# Patient Record
Sex: Female | Born: 1960 | Race: White | Hispanic: No | Marital: Married | Smoking: Former smoker
Health system: Southern US, Community
[De-identification: ages and names within clinical notes are randomized; demographics above are authoritative.]

## PROBLEM LIST (undated history)

## (undated) DIAGNOSIS — M810 Age-related osteoporosis without current pathological fracture: Secondary | ICD-10-CM

## (undated) DIAGNOSIS — M1990 Unspecified osteoarthritis, unspecified site: Secondary | ICD-10-CM

## (undated) DIAGNOSIS — R700 Elevated erythrocyte sedimentation rate: Secondary | ICD-10-CM

## (undated) DIAGNOSIS — K3532 Acute appendicitis with perforation and localized peritonitis, without abscess: Secondary | ICD-10-CM

## (undated) DIAGNOSIS — M179 Osteoarthritis of knee, unspecified: Secondary | ICD-10-CM

## (undated) DIAGNOSIS — F329 Major depressive disorder, single episode, unspecified: Secondary | ICD-10-CM

## (undated) DIAGNOSIS — K219 Gastro-esophageal reflux disease without esophagitis: Secondary | ICD-10-CM

## (undated) DIAGNOSIS — F419 Anxiety disorder, unspecified: Secondary | ICD-10-CM

## (undated) DIAGNOSIS — K439 Ventral hernia without obstruction or gangrene: Secondary | ICD-10-CM

## (undated) DIAGNOSIS — R7989 Other specified abnormal findings of blood chemistry: Secondary | ICD-10-CM

## (undated) DIAGNOSIS — G4700 Insomnia, unspecified: Secondary | ICD-10-CM

## (undated) DIAGNOSIS — M47816 Spondylosis without myelopathy or radiculopathy, lumbar region: Secondary | ICD-10-CM

## (undated) DIAGNOSIS — J309 Allergic rhinitis, unspecified: Secondary | ICD-10-CM

## (undated) DIAGNOSIS — K631 Perforation of intestine (nontraumatic): Secondary | ICD-10-CM

## (undated) DIAGNOSIS — M1710 Unilateral primary osteoarthritis, unspecified knee: Secondary | ICD-10-CM

## (undated) DIAGNOSIS — F32A Depression, unspecified: Secondary | ICD-10-CM

## (undated) DIAGNOSIS — M791 Myalgia, unspecified site: Secondary | ICD-10-CM

## (undated) DIAGNOSIS — J449 Chronic obstructive pulmonary disease, unspecified: Secondary | ICD-10-CM

## (undated) DIAGNOSIS — M2550 Pain in unspecified joint: Secondary | ICD-10-CM

## (undated) DIAGNOSIS — F1020 Alcohol dependence, uncomplicated: Secondary | ICD-10-CM

## (undated) DIAGNOSIS — C539 Malignant neoplasm of cervix uteri, unspecified: Secondary | ICD-10-CM

## (undated) DIAGNOSIS — K8050 Calculus of bile duct without cholangitis or cholecystitis without obstruction: Secondary | ICD-10-CM

## (undated) DIAGNOSIS — E7800 Pure hypercholesterolemia, unspecified: Secondary | ICD-10-CM

## (undated) DIAGNOSIS — Z1211 Encounter for screening for malignant neoplasm of colon: Secondary | ICD-10-CM

## (undated) DIAGNOSIS — J45909 Unspecified asthma, uncomplicated: Secondary | ICD-10-CM

## (undated) HISTORY — DX: Chronic obstructive pulmonary disease, unspecified: J44.9

## (undated) HISTORY — PX: PARTIAL HYSTERECTOMY: SHX80

## (undated) HISTORY — DX: Other specified abnormal findings of blood chemistry: R79.89

## (undated) HISTORY — DX: Calculus of bile duct without cholangitis or cholecystitis without obstruction: K80.50

## (undated) HISTORY — PX: ABDOMINAL HYSTERECTOMY: SHX81

## (undated) HISTORY — DX: Malignant neoplasm of cervix uteri, unspecified: C53.9

## (undated) HISTORY — DX: Acute appendicitis with perforation, localized peritonitis, and gangrene, without abscess: K35.32

## (undated) HISTORY — DX: Perforation of intestine (nontraumatic): K63.1

## (undated) HISTORY — PX: OTHER SURGICAL HISTORY: SHX169

## (undated) HISTORY — DX: Major depressive disorder, single episode, unspecified: F32.9

## (undated) HISTORY — DX: Allergic rhinitis, unspecified: J30.9

## (undated) HISTORY — DX: Anxiety disorder, unspecified: F41.9

## (undated) HISTORY — DX: Age-related osteoporosis without current pathological fracture: M81.0

## (undated) HISTORY — DX: Insomnia, unspecified: G47.00

## (undated) HISTORY — PX: HERNIA REPAIR: SHX51

## (undated) HISTORY — PX: TONSILLECTOMY: SUR1361

## (undated) HISTORY — DX: Unspecified osteoarthritis, unspecified site: M19.90

## (undated) HISTORY — PX: CYSTOSCOPY: SUR368

## (undated) HISTORY — PX: CHOLECYSTECTOMY: SHX55

## (undated) HISTORY — DX: Myalgia, unspecified site: M79.10

## (undated) HISTORY — DX: Gastro-esophageal reflux disease without esophagitis: K21.9

## (undated) HISTORY — DX: Osteoarthritis of knee, unspecified: M17.9

## (undated) HISTORY — DX: Ventral hernia without obstruction or gangrene: K43.9

## (undated) HISTORY — PX: BILATERAL SALPINGOOPHORECTOMY: SHX1223

## (undated) HISTORY — DX: Alcohol dependence, uncomplicated: F10.20

## (undated) HISTORY — PX: ESOPHAGOGASTRODUODENOSCOPY: SHX1529

## (undated) HISTORY — DX: Unspecified asthma, uncomplicated: J45.909

## (undated) HISTORY — PX: COSMETIC SURGERY: SHX468

## (undated) HISTORY — PX: COLOSTOMY: SHX63

## (undated) HISTORY — DX: Depression, unspecified: F32.A

## (undated) HISTORY — PX: NOSE SURGERY: SHX723

## (undated) HISTORY — DX: Pure hypercholesterolemia, unspecified: E78.00

## (undated) HISTORY — PX: SMALL INTESTINE SURGERY: SHX150

## (undated) HISTORY — DX: Encounter for screening for malignant neoplasm of colon: Z12.11

## (undated) HISTORY — DX: Unilateral primary osteoarthritis, unspecified knee: M17.10

## (undated) HISTORY — PX: MOUTH SURGERY: SHX715

## (undated) HISTORY — DX: Spondylosis without myelopathy or radiculopathy, lumbar region: M47.816

## (undated) HISTORY — PX: COLON SURGERY: SHX602

## (undated) HISTORY — PX: COLONOSCOPY: SHX174

---
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
## 1981-11-23 HISTORY — PX: PARTIAL HYSTERECTOMY: SHX80

## 1982-11-23 HISTORY — PX: TOTAL ABDOMINAL HYSTERECTOMY W/ BILATERAL SALPINGOOPHORECTOMY: SHX83

## 2008-11-23 HISTORY — PX: BREAST BIOPSY: SHX20

## 2013-11-23 DIAGNOSIS — K5790 Diverticulosis of intestine, part unspecified, without perforation or abscess without bleeding: Secondary | ICD-10-CM

## 2013-11-23 HISTORY — DX: Diverticulosis of intestine, part unspecified, without perforation or abscess without bleeding: K57.90

## 2014-02-21 DIAGNOSIS — K429 Umbilical hernia without obstruction or gangrene: Secondary | ICD-10-CM | POA: Insufficient documentation

## 2014-02-21 DIAGNOSIS — N83201 Unspecified ovarian cyst, right side: Secondary | ICD-10-CM | POA: Insufficient documentation

## 2014-04-02 ENCOUNTER — Encounter: Payer: Self-pay | Admitting: Family Medicine

## 2014-04-02 DIAGNOSIS — Z860101 Personal history of adenomatous and serrated colon polyps: Secondary | ICD-10-CM

## 2014-04-02 DIAGNOSIS — Z86010 Personal history of colonic polyps: Secondary | ICD-10-CM

## 2014-04-02 HISTORY — PX: COLONOSCOPY: SHX174

## 2014-04-02 HISTORY — DX: Personal history of adenomatous and serrated colon polyps: Z86.0101

## 2014-04-02 HISTORY — DX: Personal history of colonic polyps: Z86.010

## 2014-05-10 HISTORY — PX: SALPINGOOPHORECTOMY: SHX82

## 2014-10-09 DIAGNOSIS — F331 Major depressive disorder, recurrent, moderate: Secondary | ICD-10-CM | POA: Insufficient documentation

## 2014-11-23 HISTORY — PX: VENTRAL HERNIA REPAIR: SHX424

## 2016-09-04 DIAGNOSIS — F411 Generalized anxiety disorder: Secondary | ICD-10-CM | POA: Insufficient documentation

## 2017-07-27 DIAGNOSIS — G44211 Episodic tension-type headache, intractable: Secondary | ICD-10-CM | POA: Insufficient documentation

## 2017-09-08 ENCOUNTER — Ambulatory Visit (INDEPENDENT_AMBULATORY_CARE_PROVIDER_SITE_OTHER)
Admission: RE | Admit: 2017-09-08 | Discharge: 2017-09-08 | Disposition: A | Discharge status: Home / Self Care | Payer: Commercial Managed Care - PPO | Source: Ambulatory Visit | Attending: Emergency Medicine | Admitting: Emergency Medicine

## 2017-09-08 ENCOUNTER — Encounter: Payer: Self-pay | Admitting: Emergency Medicine

## 2017-09-08 ENCOUNTER — Ambulatory Visit (INDEPENDENT_AMBULATORY_CARE_PROVIDER_SITE_OTHER): Payer: Commercial Managed Care - PPO | Admitting: Emergency Medicine

## 2017-09-08 DIAGNOSIS — J449 Chronic obstructive pulmonary disease, unspecified: Secondary | ICD-10-CM

## 2017-09-08 MED ORDER — AZITHROMYCIN 250 MG PO TABS: ORAL_TABLET | ORAL | 0 refills | Status: AC

## 2017-09-08 MED ORDER — PREDNISONE 10 MG PO TABS: ORAL_TABLET | ORAL | 0 refills | Status: DC

## 2017-09-08 NOTE — Patient Instructions (Addendum)
Please stop Spiriva for now We will try starting Bevespi 2 puffs twice a day. Please rinse and gargle after using this medication. If you benefit from the new medication then we will write a prescription for it and continue it. Take prednisone as directed until completely gone Take azithromycin as directed until completely gone Take albuterol (pro-air) 2 puffs up to every 4 hours if needed for shortness of breath.  CXR today.  Alpha-1 antitrypsin testing blood work today We will perform full pulmonary function testing at your next visit Follow with Dr Lamonte Sakai in 1 month or next available with full PFT.

## 2017-09-08 NOTE — Progress Notes (Signed)
Subjective:    Patient ID: Olivia Mckee, female    DOB: November 02, 1961, 56 y.o.   MRN: [id number]  HPI 56 year old woman, former tobacco (25-pack-years), history of obesity, depression/anxiety, migraine headaches, cervical cancer status post hysterectomy followed later by bilateral salpingo-oophorectomy, bowel perforation post repair, ostomy, subsequent revision. She cares a diagnosis of COPD made 2015 during hospitalization. Has previously been on Symbicort > had thrush. She has been on Spiriva respimat since mid September. She uses ProAir about 4 times a day lately. Symptoms have escalated since the change was made. She has exertional dyspnea with walking and with home chores. She hears wheeze frequently. Lots of cough since 2 weeks ago, productive of yellow. She reports that she has been treated for bronchitis frequently, about once a year. Last time 1 yr ago.   She is working on losing wt which is helping her breathing  Spirometry performed 10/26/16 at [facility], raw data available FEV1 75% predicted, FEV1 to FVC ratio 83%, FVC not available   Review of Systems  Constitutional: Negative for fever and unexpected weight change.  HENT: Negative for congestion, dental problem, ear pain, nosebleeds, postnasal drip, rhinorrhea, sinus pressure, sneezing, sore throat and trouble swallowing.   Eyes: Negative for redness and itching.  Respiratory: Positive for cough, chest tightness, shortness of breath and wheezing.   Cardiovascular: Negative for palpitations and leg swelling.  Gastrointestinal: Negative for nausea and vomiting.  Genitourinary: Negative for dysuria.  Musculoskeletal: Negative for joint swelling.  Skin: Negative for rash.  Neurological: Negative for headaches.  Hematological: Does not bruise/bleed easily.  Psychiatric/Behavioral: Negative for dysphoric mood. The patient is not nervous/anxious.    Past Medical History:  Diagnosis Date  . Alcoholism ([place])   . Anxiety   .  Asthma   . Cervical cancer ([place])   . COPD (chronic obstructive pulmonary disease) ([place])   . Depression   . Perforated bowel ([place])   . Ventral hernia     Family History  Problem Relation Age of Onset  . Diabetes Mother   . Hypertension Mother   . Diabetes Father   . Stroke Maternal Grandmother   . Kidney disease Maternal Grandmother   . Diabetes Maternal Grandmother   . Heart disease Maternal Grandmother   . Arthritis Maternal Grandmother   . Stroke Maternal Grandfather   . Prostate cancer Maternal Grandfather   . Diabetes Maternal Grandfather   . Heart disease Maternal Grandfather   . Arthritis Maternal Grandfather   . Diabetes Paternal Grandmother   . Diabetes Paternal Grandfather     Diabetes- multiple members Hypertension-mother Stroke-maternal grandmother and maternal grandfather Kidney disease-maternal grandmother Diabetes- multiple members Coronary artery disease-maternal grandmother, maternal grandfather Stroke-maternal grandfather Prostate cancer-maternal grandfather   Social History   Social History  . Marital status: Married    Spouse name: N/A  . Number of children: N/A  . Years of education: N/A   Occupational History  . Not on file.   Social History Main Topics  . Smoking status: Former Smoker    Packs/day: 1.00    Years: 25.00    Types: Cigarettes    Quit date: 11/23/2010  . Smokeless tobacco: Never Used  . Alcohol use No  . Drug use: No  . Sexual activity: Not on file   Other Topics Concern  . Not on file   Social History Narrative  . No narrative on file  Former tobacco, 25-pack-years Has lived [place], [place] Has worked Biomedical scientist, Clinical research associate.  Allergies  Allergen Reactions  . Penicillins Anaphylaxis and Rash  . Sulfa Antibiotics Anaphylaxis and Rash     No outpatient prescriptions prior to visit.   No facility-administered medications prior to visit.         Objective:   Physical Exam Vitals:    09/08/17 0938 09/08/17 0939  BP:  120/88  Pulse:  92  SpO2:  97%  Weight: 208 lb (94.3 kg)   Height: 5\' 3"  (1.6 m)    Gen: Pleasant, Overweight woman, in no distress,  normal affect  ENT: No lesions,  mouth clear,  oropharynx clear, no postnasal drip  Neck: No JVD, mild inspiratory and expiratory stridor  Lungs: No use of accessory muscles, some referred upper airway noise but she does appear to have scattered true lower airways wheezing on expiration  Cardiovascular: RRR, heart sounds normal, no murmur or gallops, no peripheral edema  Musculoskeletal: No deformities, no cyanosis or clubbing  Neuro: alert, non focal  Skin: Warm, no lesions or rashes     Assessment & Plan:  COPD (chronic obstructive pulmonary disease) (HCC) Clinical history consistent with COPD although spirometry 10/26/16 nondiagnostic. She appeared to have a mild exacerbation and has a overall clinical worsening since her Symbicort was changed to Spiriva. He due to thrush. I will try a switch to Bevespi to see if she benefits from adding back LABA. Treat her for an acute exacerbation with prednisone, azithromycin. She needs pulmonary function testing but I will do this once she is at her usual baseline. She also will need a flu shot after this acute illness has resolved. CXR and alpha-1 antitrypsin testing today.   Please stop Spiriva for now We will try starting Bevespi 2 puffs twice a day. Please rinse and gargle after using this medication. If you benefit from the new medication then we will write a prescription for it and continue it. Take prednisone as directed until completely gone Take azithromycin as directed until completely gone Take albuterol (pro-air) 2 puffs up to every 4 hours if needed for shortness of breath.  CXR today.  Alpha-1 antitrypsin testing blood work today We will perform full pulmonary function testing at your next visit Follow with Dr Lamonte Sakai in 1 month or next available with full PFT.     Baltazar Apo, MD, PhD 09/08/2017, 10:07 AM  Pulmonary and Critical Care [phone] or if no answer [phone]

## 2017-09-08 NOTE — Assessment & Plan Note (Signed)
Clinical history consistent with COPD although spirometry 10/26/16 nondiagnostic. She appeared to have a mild exacerbation and has a overall clinical worsening since her Symbicort was changed to Spiriva. He due to thrush. I will try a switch to Bevespi to see if she benefits from adding back LABA. Treat her for an acute exacerbation with prednisone, azithromycin. She needs pulmonary function testing but I will do this once she is at her usual baseline. She also will need a flu shot after this acute illness has resolved. CXR and alpha-1 antitrypsin testing today.   Please stop Spiriva for now We will try starting Bevespi 2 puffs twice a day. Please rinse and gargle after using this medication. If you benefit from the new medication then we will write a prescription for it and continue it. Take prednisone as directed until completely gone Take azithromycin as directed until completely gone Take albuterol (pro-air) 2 puffs up to every 4 hours if needed for shortness of breath.  CXR today.  Alpha-1 antitrypsin testing blood work today We will perform full pulmonary function testing at your next visit Follow with Dr Lamonte Sakai in 1 month or next available with full PFT.

## 2017-09-09 ENCOUNTER — Other Ambulatory Visit: Payer: Commercial Managed Care - PPO

## 2017-09-09 DIAGNOSIS — J449 Chronic obstructive pulmonary disease, unspecified: Secondary | ICD-10-CM

## 2017-09-14 LAB — ALPHA-1 ANTITRYPSIN PHENOTYPE: A-1 Antitrypsin, Ser: 138 mg/dL (ref 83–199)

## 2017-09-16 ENCOUNTER — Encounter: Payer: Self-pay | Admitting: Emergency Medicine

## 2017-09-20 ENCOUNTER — Telehealth: Payer: Self-pay

## 2017-09-20 ENCOUNTER — Other Ambulatory Visit: Payer: Self-pay | Admitting: Emergency Medicine

## 2017-09-20 NOTE — Telephone Encounter (Signed)
I called patient. No answer, left message on machine. Not sure that it is indicated to increase her prednisone back to higher dose (probably 40mg  daily based on my usual taper). I would also like to know whether she is taking the bevespi as we had planned. Has she benefited from it? I wil try to reach her again. If she calls and is having refractory sx on Pred 30mg  daily then we should offer her an OV, or have her go to the ED.

## 2017-09-20 NOTE — Telephone Encounter (Signed)
Will await a call back

## 2017-09-20 NOTE — Telephone Encounter (Signed)
Received patient email that stated that following:   I feel like something sitting on my chest it's hard to breathe I walk to the mail[place] and back and I'm completely out of breath he gave me that prednisone and it really really helped by the second day of five pills I could walk up and down the hill by day of the third three pills it started acting up again now I'm coughing choking can't breathe those prednisone worked wonders.   RB please advise. Thanks.

## 2017-09-20 NOTE — Telephone Encounter (Signed)
Pt now calling about the e-chart message she had left for the predisone.-t

## 2017-09-21 NOTE — Telephone Encounter (Signed)
RB- please see the following email chain and advise recs, thanks!       2:42 PM  Ask him to please call336 [phone] this is my cell phone number the inhaler he gave me is working the Prozac seems to take the weight off my chest and make me feel a lot better I don't want to up the dose I would just like to have a smaller dose daily to help me if he can do that thank you for your time   Me  to [place]       2:34 PM  Hi Blanch Media,  Dr Lamonte Sakai said the following regarding your last email:   Not sure that it is indicated to increase her prednisone back to higher dose (probably 40mg  daily based on my usual taper). I would also like to know whether she is taking the bevespi as we had planned. Has she benefited from it? I wil try to reach her again. If she calls and is having refractory symptoms on Pred 30mg  daily then we should offer her an OV, or have her go to the ED.   Are you taking the Bevespi and do you feel like it is helping? What strength of the prednisone are you down to? Let us know and we can go from there, or you can call for appointment if needed. Thanks.    Last read by Berton Mount at 2:43 PM on 09/21/2017.  Blanch Media Caesar  to Collene Gobble, MD       12:12 PM  We missed each other yesterday I was picking the boys up from school when he called at 3:30 p.m. if he can you can call me between now and 2 when I leave to go get the boys again. I don't know if you can send me in a prescription for it maybe less of it two pills a day was completely keeping my breathing very well with the inhaler but once I got down to one pill a day it's like a completely stopped working I Can't Catch My Breath Again I hope I'm not bothering you but he hasn't called me yet and he said he'd called today I'm kind of waiting on his call. You have a great day thank you for everything.   Aleigh   I called and spoke with her b/c I was confused about her mention of prozac  She states she meant to type  prednisone  She finished her taper 2 days ago and "starting to feel bad" again with non prod cough and chest tightness  She declined ov due to high copay  Please advise thanks

## 2017-09-21 NOTE — Telephone Encounter (Signed)
Spoke with the pt and notified of recs per RB and she verbalized understanding

## 2017-09-21 NOTE — Telephone Encounter (Signed)
For now I would avoid daily prednisone. We still have room to make her breathing better with inhaled medications, and prednisone has multiple potential side effects that I would like to avoid, not the least of which is mood disturbance (which would affect how well her Prozac works). Have her stick with the bevespi, stay off prednisone for now, and we can discuss any changes that need to be made when we see each other back.

## 2017-09-21 NOTE — Telephone Encounter (Signed)
Spoke with pt- please see phone note dated 09/20/17

## 2017-09-21 NOTE — Telephone Encounter (Signed)
LMTCB

## 2017-10-21 ENCOUNTER — Ambulatory Visit (INDEPENDENT_AMBULATORY_CARE_PROVIDER_SITE_OTHER): Payer: Commercial Managed Care - PPO | Admitting: Emergency Medicine

## 2017-10-21 ENCOUNTER — Encounter: Payer: Self-pay | Admitting: Emergency Medicine

## 2017-10-21 DIAGNOSIS — R05 Cough: Secondary | ICD-10-CM | POA: Diagnosis not present

## 2017-10-21 DIAGNOSIS — J449 Chronic obstructive pulmonary disease, unspecified: Secondary | ICD-10-CM | POA: Diagnosis not present

## 2017-10-21 DIAGNOSIS — R053 Chronic cough: Secondary | ICD-10-CM

## 2017-10-21 LAB — PULMONARY FUNCTION TEST
DL/VA % pred: 86 %
DL/VA: 3.81 ml/min/mmHg/L
DLCO cor % pred: 87 %
DLCO cor: 17.69 ml/min/mmHg
DLCO unc % pred: 81 %
DLCO unc: 16.57 ml/min/mmHg
FEF 25-75 Post: 1.13 L/sec
FEF 25-75 Pre: 1.08 L/sec
FEF2575-%Change-Post: 4 %
FEF2575-%Pred-Post: 48 %
FEF2575-%Pred-Pre: 46 %
FEV1-%Change-Post: 3 %
FEV1-%Pred-Post: 78 %
FEV1-%Pred-Pre: 75 %
FEV1-Post: 1.85 L
FEV1-Pre: 1.79 L
FEV1FVC-%Change-Post: 0 %
FEV1FVC-%Pred-Pre: 88 %
FEV6-%Change-Post: 2 %
FEV6-%Pred-Post: 89 %
FEV6-%Pred-Pre: 87 %
FEV6-Post: 2.63 L
FEV6-Pre: 2.56 L
FEV6FVC-%Change-Post: 0 %
FEV6FVC-%Pred-Post: 102 %
FEV6FVC-%Pred-Pre: 103 %
FVC-%Change-Post: 3 %
FVC-%Pred-Post: 87 %
FVC-%Pred-Pre: 84 %
FVC-Post: 2.64 L
FVC-Pre: 2.56 L
Post FEV1/FVC ratio: 70 %
Post FEV6/FVC ratio: 100 %
Pre FEV1/FVC ratio: 70 %
Pre FEV6/FVC Ratio: 100 %
RV % pred: 114 %
RV: 2.03 L
TLC % pred: 106 %
TLC: 4.91 L

## 2017-10-21 MED ORDER — ALBUTEROL SULFATE HFA 108 (90 BASE) MCG/ACT IN AERS: 2.0000 | INHALATION_SPRAY | Freq: Four times a day (QID) | RESPIRATORY_TRACT | 6 refills | Status: DC | PRN

## 2017-10-21 MED ORDER — GLYCOPYRROLATE-FORMOTEROL 9-4.8 MCG/ACT IN AERO: 2.0000 | INHALATION_SPRAY | Freq: Two times a day (BID) | RESPIRATORY_TRACT | 6 refills | Status: DC

## 2017-10-21 NOTE — Progress Notes (Signed)
PFT completed today 10/21/17.  

## 2017-10-21 NOTE — Progress Notes (Signed)
Subjective:    Patient ID: Olivia Mckee, female    DOB: 1961-05-18, 56 y.o.   MRN: [id number]  HPI 56 year old woman, former tobacco (25-pack-years), history of obesity, depression/anxiety, migraine headaches, cervical cancer status post hysterectomy followed later by bilateral salpingo-oophorectomy, bowel perforation post repair, ostomy, subsequent revision. She cares a diagnosis of COPD made 2015 during hospitalization. Has previously been on Symbicort > had thrush. She has been on Spiriva respimat since mid September. She uses ProAir about 4 times a day lately. Symptoms have escalated since the change was made. She has exertional dyspnea with walking and with home chores. She hears wheeze frequently. Lots of cough since 2 weeks ago, productive of yellow. She reports that she has been treated for bronchitis frequently, about once a year. Last time 1 yr ago.   She is working on losing wt which is helping her breathing  Spirometry performed 10/26/16 at [facility], raw data available FEV1 75% predicted, FEV1 to FVC ratio 83%, FVC not available  ROV 10/21/17 --this is a follow-up visit for patient with history of tobacco, COPD.  At her last visit I tried changing her Spiriva to bevespi to see if she would benefit.  May have helped some. We also treated her for an acute exacerbation with prednisone and azithromycin.  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 Her barking cough improved transiently while on Pred, now present again. She is on omperazole 20mg , loratadine 10mg  qd, flonase prn. She had PFT today that I have reviewed > moderate obstruction without BD response.    Review of Systems  Constitutional: Negative for fever and unexpected weight change.  HENT: Negative for congestion, dental problem, ear pain, nosebleeds, postnasal drip, rhinorrhea, sinus pressure, sneezing, sore throat and trouble swallowing.   Eyes: Negative for redness and itching.  Respiratory: Positive  for cough, chest tightness, shortness of breath and wheezing.   Cardiovascular: Negative for palpitations and leg swelling.  Gastrointestinal: Negative for nausea and vomiting.  Genitourinary: Negative for dysuria.  Musculoskeletal: Negative for joint swelling.  Skin: Negative for rash.  Neurological: Negative for headaches.  Hematological: Does not bruise/bleed easily.  Psychiatric/Behavioral: Negative for dysphoric mood. The patient is not nervous/anxious.    Past Medical History:  Diagnosis Date  . Alcoholism ([place])   . Anxiety   . Asthma   . Cervical cancer ([place])   . COPD (chronic obstructive pulmonary disease) ([place])   . Depression   . Perforated bowel ([place])   . Ventral hernia     Family History  Problem Relation Age of Onset  . Diabetes Mother   . Hypertension Mother   . Diabetes Father   . Stroke Maternal Grandmother   . Kidney disease Maternal Grandmother   . Diabetes Maternal Grandmother   . Heart disease Maternal Grandmother   . Arthritis Maternal Grandmother   . Stroke Maternal Grandfather   . Prostate cancer Maternal Grandfather   . Diabetes Maternal Grandfather   . Heart disease Maternal Grandfather   . Arthritis Maternal Grandfather   . Diabetes Paternal Grandmother   . Diabetes Paternal Grandfather     Diabetes- multiple members Hypertension-mother Stroke-maternal grandmother and maternal grandfather Kidney disease-maternal grandmother Diabetes- multiple members Coronary artery disease-maternal grandmother, maternal grandfather Stroke-maternal grandfather Prostate cancer-maternal grandfather   Social History   Socioeconomic History  . Marital status: Married    Spouse name: Not on file  . Number of children: Not on file  . Years of education: Not on file  .  Highest education level: Not on file  Social Needs  . Financial resource strain: Not on file  . Food insecurity - worry: Not on file  . Food insecurity - inability: Not on file  .  Transportation needs - medical: Not on file  . Transportation needs - non-medical: Not on file  Occupational History  . Not on file  Tobacco Use  . Smoking status: Former Smoker    Packs/day: 1.00    Years: 25.00    Pack years: 25.00    Types: Cigarettes    Last attempt to quit: 11/23/2010    Years since quitting: 6.9  . Smokeless tobacco: Never Used  Substance and Sexual Activity  . Alcohol use: No  . Drug use: No  . Sexual activity: Not on file  Other Topics Concern  . Not on file  Social History Narrative  . Not on file  Former tobacco, 25-pack-years Has lived [place], [place] Has worked Biomedical scientist, Clinical research associate.     Allergies  Allergen Reactions  . Penicillins Anaphylaxis and Rash  . Sulfa Antibiotics Anaphylaxis and Rash     Outpatient Medications Prior to Visit  Medication Sig Dispense Refill  . albuterol (VENTOLIN HFA) 108 (90 Base) MCG/ACT inhaler Inhale 2 puffs into the lungs every 6 (six) hours as needed.    . ALPRAZolam (XANAX) 0.5 MG tablet Take 1 tablet by mouth 3 (three) times daily as needed.    Marland Kitchen buPROPion (WELLBUTRIN XL) 300 MG 24 hr tablet Take 1 tablet by mouth daily.    . busPIRone (BUSPAR) 10 MG tablet Take 1 tablet by mouth 3 (three) times daily.    Marland Kitchen FLUoxetine (PROZAC) 20 MG capsule Take 1 capsule by mouth daily.    Marland Kitchen tiotropium (SPIRIVA) 18 MCG inhalation capsule Place 18 mcg into inhaler and inhale daily.    . predniSONE (DELTASONE) 10 MG tablet Take 4 tablets for 3 days, 3 tablets for 3 days, 2 tablets for 3 days, 1 tablet for 3 days 30 tablet 0   No facility-administered medications prior to visit.         Objective:   Physical Exam Vitals:   10/21/17 1152 10/21/17 1155  BP:  114/62  Pulse:  94  SpO2:  97%  Weight: 219 lb (99.3 kg)   Height: 5\' 1"  (1.549 m)    Gen: Pleasant, Overweight woman, in no distress,  normal affect  ENT: No lesions,  mouth clear,  oropharynx clear, no postnasal drip  Neck: No JVD, mild  inspiratory and expiratory stridor  Lungs: No use of accessory muscles, some referred upper airway noise   Cardiovascular: RRR, heart sounds normal, no murmur or gallops, no peripheral edema  Musculoskeletal: No deformities, no cyanosis or clubbing  Neuro: alert, non focal  Skin: Warm, no lesions or rashes     Assessment & Plan:  Chronic cough Appears to be significantly impacted by chronic rhinitis which is not completely treated.  Also by reflux.  Possibly also by her inhaled bronchodilator regimen which is irritating her throat.  I would like to continue loratadine, have her start taking fluticasone nasal spray every day instead of as needed.  Also increase her omeprazole to 20 mg twice a day.  Stressed voice rest and avoiding throat clearing with her today.  COPD (chronic obstructive pulmonary disease) (HCC) Continue bevespi, albuterol prn. PFT reviewed with her today.  Alpha-1 antitrypsin MM  Baltazar Apo, MD, PhD 10/21/2017, 12:09 PM [facility] [phone] or if  no answer [phone]

## 2017-10-21 NOTE — Addendum Note (Signed)
Addended by: Clayborne Dana C on: 10/21/2017 12:26 PM   Modules accepted: Orders

## 2017-10-21 NOTE — Patient Instructions (Signed)
Please continue Bevespi 2 puffs twice a day Keep albuterol available to use as needed for shortness of breath Please increase omeprazole to 20 mg twice a day.  Take this medication 30-60 minutes before or after eating. Continue loratadine 10 mg daily (Claritin) Start taking fluticasone (Flonase) nasal spray, 2 sprays each nostril every day Follow with Dr Lamonte Sakai in 4 months or sooner if you have any problems.

## 2017-10-21 NOTE — Assessment & Plan Note (Signed)
Continue bevespi, albuterol prn. PFT reviewed with her today.  Alpha-1 antitrypsin MM

## 2017-10-21 NOTE — Assessment & Plan Note (Signed)
Appears to be significantly impacted by chronic rhinitis which is not completely treated.  Also by reflux.  Possibly also by her inhaled bronchodilator regimen which is irritating her throat.  I would like to continue loratadine, have her start taking fluticasone nasal spray every day instead of as needed.  Also increase her omeprazole to 20 mg twice a day.  Stressed voice rest and avoiding throat clearing with her today.

## 2017-10-26 ENCOUNTER — Telehealth: Payer: Self-pay | Admitting: Emergency Medicine

## 2017-10-26 MED ORDER — PREDNISONE 20 MG PO TABS: 40.0000 mg | ORAL_TABLET | Freq: Every day | ORAL | 0 refills | Status: DC

## 2017-10-26 NOTE — Telephone Encounter (Signed)
Called spoke with patient who reports an increased and constant cough/tickle in her throat onset today with some wheezing, some white mucus production; pt states she coughs to the point where her chest becomes sore.  Pt stated that she used her Proair earlier this morning and only received about 15 mins of relief - she is following all other recommendations from last ov.  Pt stated the last time she was coughing this frequently, RB rx'd a pred taper and this took care of this issue (given at the 10.17.18 consult).  Dr Lamonte Sakai please advise, thank you. [facility] Allergies  Allergen Reactions  . Penicillins Anaphylaxis and Rash  . Sulfa Antibiotics Anaphylaxis and Rash     Per 11.29.18 ov w/ RB: Patient Instructions  Please continue Bevespi 2 puffs twice a day Keep albuterol available to use as needed for shortness of breath Please increase omeprazole to 20 mg twice a day.  Take this medication 30-60 minutes before or after eating. Continue loratadine 10 mg daily (Claritin) Start taking fluticasone (Flonase) nasal spray, 2 sprays each nostril every day Follow with Dr Lamonte Sakai in 4 months or sooner if you have any problems.

## 2017-10-26 NOTE — Telephone Encounter (Signed)
Can treat with short burst prednisone, but make sure she understands that this is only going to give temporary relief. Preventing her throat irritation is our most important job.   Give prednisone 40mg  qd x 5 days then stop

## 2017-10-26 NOTE — Telephone Encounter (Signed)
Called pt letting her know that we are going to be sending an Rx of prednisone 40mg  for her to take once daily for 5 days then stop. Pt expressed understanding. Rx sent to [facility]'s [facility] [place]. Nothing further needed.

## 2017-10-29 ENCOUNTER — Telehealth: Payer: Self-pay | Admitting: Emergency Medicine

## 2017-10-29 MED ORDER — NYSTATIN 100000 UNIT/ML MT SUSP: 5.0000 mL | Freq: Four times a day (QID) | OROMUCOSAL | 0 refills | Status: DC

## 2017-10-29 NOTE — Telephone Encounter (Signed)
Can send script for nystatin swish and swallow 5 ml qid x 5 days.

## 2017-10-29 NOTE — Telephone Encounter (Signed)
Spoke with pt, she states she has been washing her mouth out but still has developed thrush. Can we call in a Rx for this. Please advise.

## 2017-10-29 NOTE — Telephone Encounter (Signed)
Spoke with pt letting her know we were sending in an Rx to her preferred pharmacy to help with the thrush. Pt expressed understanding. Nothing further needed.

## 2018-02-15 ENCOUNTER — Telehealth: Payer: Self-pay | Admitting: Emergency Medicine

## 2018-02-15 MED ORDER — AZITHROMYCIN 250 MG PO TABS: ORAL_TABLET | ORAL | 0 refills | Status: DC

## 2018-02-15 MED ORDER — PREDNISONE 10 MG PO TABS: ORAL_TABLET | ORAL | 0 refills | Status: DC

## 2018-02-15 NOTE — Telephone Encounter (Signed)
Called pt and advised message from the provider. Pt understood and verbalized understanding. Nothing further is needed.   Rx sent to pharmacy.  

## 2018-02-15 NOTE — Telephone Encounter (Signed)
Called and spoke to pt.  Pt reports of increased sob, prod cough white mucus & wheezing x1d Cough worsens at night.  Denied fever, chills, sweats or body aches. Taking proair q4h with mild relief.  She feels that she has caught a virus from her spouse.  Pt is requesting Rx prednisone.   RB please advise. Thanks

## 2018-02-15 NOTE — Telephone Encounter (Signed)
Please ask her to take azithromycin, Z-Pak Please ask her to take prednisone, Take 40mg  daily for 3 days, then 30mg  daily for 3 days, then 20mg  daily for 3 days, then 10mg  daily for 3 days, then stop She needs to call us if she has not improving over the next several days

## 2018-03-29 DIAGNOSIS — E559 Vitamin D deficiency, unspecified: Secondary | ICD-10-CM | POA: Insufficient documentation

## 2018-04-13 ENCOUNTER — Telehealth: Payer: Self-pay | Admitting: Emergency Medicine

## 2018-04-13 ENCOUNTER — Other Ambulatory Visit: Payer: Self-pay | Admitting: Emergency Medicine

## 2018-04-13 MED ORDER — ALBUTEROL SULFATE HFA 108 (90 BASE) MCG/ACT IN AERS: 2.0000 | INHALATION_SPRAY | RESPIRATORY_TRACT | 0 refills | Status: DC | PRN

## 2018-04-13 NOTE — Telephone Encounter (Signed)
Spoke with pt. She is requesting refills on Bevespi and Proair. Bevespi was sent in earlier today. Proair has been sent in. Nothing further was needed.

## 2018-04-16 ENCOUNTER — Telehealth: Payer: Self-pay | Admitting: Pulmonary Disease

## 2018-04-16 ENCOUNTER — Encounter: Payer: Self-pay | Admitting: Emergency Medicine

## 2018-04-16 NOTE — Telephone Encounter (Signed)
Pt called and stated that her prescription was called in wrong. Bevespi was refilled as 1 month supply instead of 3 months. Hence it costs more. She has been off of her inhalers for 2 days and is starting to feel more dyspnea  74-month supply of bevespi called into her pharmacy at [place], [place] MD [facility] 04/16/2018, 12:34 PM

## 2018-05-09 ENCOUNTER — Other Ambulatory Visit: Payer: Self-pay | Admitting: Emergency Medicine

## 2018-06-09 ENCOUNTER — Other Ambulatory Visit: Payer: Self-pay | Admitting: Emergency Medicine

## 2018-07-02 ENCOUNTER — Other Ambulatory Visit: Payer: Self-pay | Admitting: Emergency Medicine

## 2018-07-22 ENCOUNTER — Telehealth: Payer: Self-pay | Admitting: Pulmonary Disease

## 2018-07-22 MED ORDER — PREDNISONE 10 MG PO TABS: ORAL_TABLET | ORAL | 0 refills | Status: DC

## 2018-07-22 MED ORDER — DOXYCYCLINE HYCLATE 100 MG PO TABS: 100.0000 mg | ORAL_TABLET | Freq: Two times a day (BID) | ORAL | 0 refills | Status: DC

## 2018-07-22 NOTE — Telephone Encounter (Signed)
Called and spoke with Patient.  Patient is complaining of feeling SHOB and productive cough, with clear,occassional yellow mucus.  She says she feels like she has something in her throat.  [facility] [place]    Dr. Lamonte Sakai please advise

## 2018-07-22 NOTE — Telephone Encounter (Signed)
Have her take   Prednisone - take 40mg  daily for 3 days, then 30mg  daily for 3 days, then 20mg  daily for 3 days, then 10mg  daily for 3 days, then stop Doxycycline 100mg  bid x 7 days.

## 2018-07-22 NOTE — Telephone Encounter (Signed)
Per RB- Prednisone - take 40mg  daily for 3 days, then 30mg  daily for 3 days, then 20mg  daily for 3 days, then 10mg  daily for 3 days, then stop Doxycycline 100mg  bid x 7 days. Called and spoke with Patient. Patient stated understanding.  Prescriptions sent to [place].  Nothing further at this time.

## 2018-07-29 MED ORDER — PREDNISONE 10 MG PO TABS: 10.0000 mg | ORAL_TABLET | Freq: Every day | ORAL | 0 refills | Status: DC

## 2018-07-29 NOTE — Telephone Encounter (Signed)
Fine to give enough until ov at whatever dose pt has been on

## 2018-07-29 NOTE — Telephone Encounter (Signed)
Spoke with patient, patient voices that while taking prednisone she feels great but once the prednisone course is done she goes back to feeling like she is suffocating, chest heaviness, wheezing, fatigue, increased anxiety, increased SOB, and chest discomfort. Patient is requesting to take prednisone long term. Currently taking a prednisone taper.   RB please advise.

## 2018-07-29 NOTE — Telephone Encounter (Signed)
RB please advise. Thanks.  

## 2018-07-29 NOTE — Telephone Encounter (Signed)
MW - please advise if you are okay with giving the pt a few tablets of Prednisone to last until RB can address message on Monday. Thanks.

## 2018-08-01 ENCOUNTER — Telehealth: Payer: Self-pay | Admitting: Emergency Medicine

## 2018-08-01 NOTE — Telephone Encounter (Signed)
Called and spoke with patient, she states that she is having increased shortness of breathing with both rest and activity. She is wanting to be seen to see what is going on. She is on her last pill of prednisone today. Patient has been scheduled with BW for tomorrow. Will route to [place] as an [place]

## 2018-08-01 NOTE — Telephone Encounter (Signed)
Please let her know that while chronic prednisone may be an option, there may be other options that have fewer potential side effects and every day prednisone that we have it investigated.  There are new medications that can be beneficial with persistent obstructive lung disease-we could test her to see if she is suitable for such a medication.  Further she is not on an inhaled steroid spray right now and she could get some benefit from this, spare her from needing to take a steroid tablet, avoid systemic side effects.  I like to see her in the office to discuss, talk about some blood work that will guide [place] as far as other medicine options.  If we feel that a daily steroid ultimately is the answer than I will help arrange for this.

## 2018-08-02 ENCOUNTER — Ambulatory Visit (INDEPENDENT_AMBULATORY_CARE_PROVIDER_SITE_OTHER)
Admission: RE | Admit: 2018-08-02 | Discharge: 2018-08-02 | Disposition: A | Discharge status: Home / Self Care | Payer: Commercial Managed Care - PPO | Source: Ambulatory Visit | Attending: Nurse Practitioner | Admitting: Nurse Practitioner

## 2018-08-02 ENCOUNTER — Encounter: Payer: Self-pay | Admitting: Nurse Practitioner

## 2018-08-02 ENCOUNTER — Ambulatory Visit (INDEPENDENT_AMBULATORY_CARE_PROVIDER_SITE_OTHER): Payer: Commercial Managed Care - PPO | Admitting: Nurse Practitioner

## 2018-08-02 VITALS — BP 128/72 | HR 94 | Ht 61.0 in | Wt 221.4 lb

## 2018-08-02 DIAGNOSIS — J441 Chronic obstructive pulmonary disease with (acute) exacerbation: Secondary | ICD-10-CM | POA: Diagnosis not present

## 2018-08-02 DIAGNOSIS — J449 Chronic obstructive pulmonary disease, unspecified: Secondary | ICD-10-CM

## 2018-08-02 DIAGNOSIS — R05 Cough: Secondary | ICD-10-CM

## 2018-08-02 DIAGNOSIS — R053 Chronic cough: Secondary | ICD-10-CM

## 2018-08-02 MED ORDER — FLUTICASONE-UMECLIDIN-VILANT 100-62.5-25 MCG/INH IN AEPB: 1.0000 | INHALATION_SPRAY | Freq: Every day | RESPIRATORY_TRACT | 0 refills | Status: DC

## 2018-08-02 MED ORDER — PREDNISONE 10 MG PO TABS: ORAL_TABLET | ORAL | 0 refills | Status: DC

## 2018-08-02 NOTE — Progress Notes (Signed)
@Patient  ID: Olivia Mckee, female    DOB: [birth year], 57 y.o.   MRN: [id number]  Chief Complaint  Patient presents with  . Shortness of Breath    Referring provider: Blair Heys, PA-C   HPI  57 year old female former smoker (25 pack year) with COPD followed by Dr. Lamonte Sakai.   Tests: PFT 10/21/17 FVC 2.57, FEV1 1.79, RATIO  70%, TLC 106%, DLCO 81  Spirometry 08/02/18 FVC 1.4, FEV1 1.0, FEV1/FVC 71%  OV 08/02/18 - Acute - short of breath Patient states that she has been shorter of breath lately with wheezing. She states that she has a hard time getting up the hill at her house. She reports that prednisone tapers are the only thing that have helped and she is requesting to be on prednisone daily. She completed a prednisone taper yesterday. She denies any fever, sinus congestion, chest pain, or peripheral edema. She has been compliant with proventil and bevespi.   Allergies  Allergen Reactions  . Penicillins Anaphylaxis and Rash  . Sulfa Antibiotics Anaphylaxis and Rash    Immunization History  Administered Date(s) Administered  . Influenza, Seasonal, Injecte, Preservative Fre 12/23/2015, 07/31/2016  . Influenza-Unspecified 11/13/2014  . Pneumococcal Polysaccharide-23 03/25/2017  . Tdap 11/28/2013    Past Medical History:  Diagnosis Date  . Alcoholism ([place])   . Anxiety   . Asthma   . Cervical cancer ([place])   . COPD (chronic obstructive pulmonary disease) ([place])   . Depression   . Perforated bowel ([place])   . Ventral hernia     Tobacco History: Social History   Tobacco Use  Smoking Status Former Smoker  . Packs/day: 1.00  . Years: 25.00  . Pack years: 25.00  . Types: Cigarettes  . Last attempt to quit: 11/23/2010  . Years since quitting: 7.6  Smokeless Tobacco Never Used   Counseling given: Yes   Outpatient Encounter Medications as of 08/02/2018  Medication Sig  . albuterol (PROVENTIL HFA;VENTOLIN HFA) 108 (90 Base) MCG/ACT inhaler INHALE 2 PUFFS INTO THE LUNGS  EVERY 4 (FOUR) HOURS AS NEEDED FOR WHEEZING OR SHORTNESS OF BREATH.  Marland Kitchen ALPRAZolam (XANAX) 0.5 MG tablet Take 1 tablet by mouth 3 (three) times daily as needed.  Marland Kitchen BEVESPI AEROSPHERE 9-4.8 MCG/ACT AERO INHALE 2 PUFFS INTO THE LUNGS 2 TIMES DAILY  . buPROPion (WELLBUTRIN XL) 300 MG 24 hr tablet Take 1 tablet by mouth daily.  Marland Kitchen FLUoxetine (PROZAC) 20 MG capsule Take 1 capsule by mouth daily.  . Fluticasone-Umeclidin-Vilant (TRELEGY ELLIPTA) 100-62.5-25 MCG/INH AEPB Inhale 1 puff into the lungs daily.  . predniSONE (DELTASONE) 10 MG tablet Take 4 tabs for 2 days, then 3 tabs for 2 days, then 2 tabs for 2 days, then 1 tab for 2 days, then stop  . tiotropium (SPIRIVA) 18 MCG inhalation capsule Place 18 mcg into inhaler and inhale daily.  . [DISCONTINUED] azithromycin (ZITHROMAX) 250 MG tablet Take 2 pills today then one a day for 4 additional days  . [DISCONTINUED] busPIRone (BUSPAR) 10 MG tablet Take 1 tablet by mouth 3 (three) times daily.  . [DISCONTINUED] doxycycline (VIBRA-TABS) 100 MG tablet Take 1 tablet (100 mg total) by mouth 2 (two) times daily.  . [DISCONTINUED] nystatin (MYCOSTATIN) 100000 UNIT/ML suspension Take 5 mLs (500,000 Units total) by mouth 4 (four) times daily.  . [DISCONTINUED] predniSONE (DELTASONE) 10 MG tablet Take 1 tablet (10 mg total) by mouth daily.   No facility-administered encounter medications on file as of 08/02/2018.      Review  of Systems  Review of Systems  Constitutional: Negative.  Negative for chills and fever.  HENT: Negative.  Negative for congestion, postnasal drip, sinus pressure and sinus pain.   Respiratory: Positive for shortness of breath and wheezing. Negative for cough.   Cardiovascular: Negative.  Negative for chest pain and leg swelling.  Gastrointestinal: Negative.   Allergic/Immunologic: Negative.   Neurological: Negative.   Psychiatric/Behavioral: Negative.        Physical Exam  BP 128/72 (BP Location: Right Arm, Patient Position:  Sitting, Cuff Size: Normal)   Pulse 94   Ht 5\' 1"  (1.549 m)   Wt 221 lb 6.4 oz (100.4 kg)   SpO2 99%   BMI 41.83 kg/m   Wt Readings from Last 5 Encounters:  08/02/18 221 lb 6.4 oz (100.4 kg)  10/21/17 219 lb (99.3 kg)  09/08/17 208 lb (94.3 kg)     Physical Exam  Constitutional: She is oriented to person, place, and time. She appears well-developed and well-nourished. No distress.  Cardiovascular: Normal rate and regular rhythm.  Pulmonary/Chest: Effort normal. She has wheezes. She has no rhonchi. She has no rales.  Neurological: She is alert and oriented to person, place, and time.  Psychiatric: She has a normal mood and affect.  Nursing note and vitals reviewed.    Imaging: Dg Chest 2 View  Result Date: 08/02/2018 CLINICAL DATA:  Cough, congestion, short of breath, former smoking history EXAM: CHEST - 2 VIEW COMPARISON:  Chest x-ray of 11/11/2017 FINDINGS: No active infiltrate or effusion is seen. Biapical pleural-parenchymal scarring is noted. Minimally prominent markings at the bases may represent scarring as well. Mediastinal and hilar contours are unremarkable and the heart is within normal limits in size. No acute bony abnormality is seen. IMPRESSION: Stable chest x-ray.  No active lung disease. Electronically Signed   By: Ivar Drape M.D.   On: 08/02/2018 10:41     Assessment & Plan:   COPD with acute exacerbation [facility]) Patient Instructions  Will change inhaler to trelegy 1 puff daily - sample given in office Prednisone taper ordered Spirometry in office today Patient was unable to complete FENO Will send for chest x ray and call with results Follow up with Dr. Lamonte Sakai in 1 month Please call or go to the ED with any worsening symptoms     Chronic cough Please continue claritin     Fenton Foy, NP 08/03/2018

## 2018-08-02 NOTE — Patient Instructions (Signed)
Will change inhaler to trelegy 1 puff daily - sample given in office Prednisone taper ordered Spirometry in office today Patient was unable to complete FENO Will send for chest x ray and call with results Follow up with Dr. Lamonte Sakai in 1 month Please call or go to the ED with any worsening symptoms

## 2018-08-03 ENCOUNTER — Encounter: Payer: Self-pay | Admitting: Nurse Practitioner

## 2018-08-03 NOTE — Assessment & Plan Note (Signed)
Please continue claritin

## 2018-08-03 NOTE — Assessment & Plan Note (Signed)
Patient Instructions  Will change inhaler to trelegy 1 puff daily - sample given in office Prednisone taper ordered Spirometry in office today Patient was unable to complete FENO Will send for chest x ray and call with results Follow up with Dr. Lamonte Sakai in 1 month Please call or go to the ED with any worsening symptoms

## 2018-08-08 ENCOUNTER — Telehealth: Payer: Self-pay | Admitting: Nurse Practitioner

## 2018-08-08 MED ORDER — NYSTATIN 100000 UNIT/ML MT SUSP: 5.0000 mL | Freq: Three times a day (TID) | OROMUCOSAL | 0 refills | Status: DC

## 2018-08-08 MED ORDER — FLUCONAZOLE 100 MG PO TABS: 100.0000 mg | ORAL_TABLET | Freq: Every day | ORAL | 0 refills | Status: DC

## 2018-08-08 NOTE — Telephone Encounter (Signed)
She probably needs to come in for OV so I can listen to her lungs. She can stop the trelegy and go back to her previous inhaler. I think she was on Spiriva.

## 2018-08-08 NOTE — Telephone Encounter (Signed)
Called spoke with patient who c/o prod cough with thick dark green mucus that has a "horrible" taste, wheezing, tightness in chest, increased SOB x2 days, worse since yesterday.  Patient denies any hemoptysis, f/c/s, chest pain.   Last ov 9.10.19 with Tonya NP and given pred taper and inhaler change to Trelegy.  Pt stated that she will finish the pred taper tomorrow.  Patient does report that the Trelegy has a "horrible taste - it's like sucking down baby powder."  Patient is gargling, swishing and spitting after each use.  She is unable to tell any difference in her breathing but is unsure if that is related to the above symptoms.  Tonya please advise, thank you.  [facility] Allerg[facility]  Allergen Reactions  . Penicillins Anaphylaxis and Rash  . Sulfa Antibiotics Anaphylaxis and Rash

## 2018-08-08 NOTE — Telephone Encounter (Signed)
E-mail with sick symptoms - will change to telephone encounter

## 2018-08-08 NOTE — Telephone Encounter (Signed)
08/08/18 e-mail from patient with sick symptoms:  Olivia Mckee, Olivia Mckee  to Collene Gobble, MD  08/08/18 7:10 AM  It's October 16th Monday. This is malvina schadler Dec 13, 2060.  I was in last Tuesday and saw Dr Lamonte Sakai RN she put me on Prednisone and a Trilogy inhaler. The trilogy inhaler taste terrible I have to gargle and rinse after it I took my 7th dose of it this morning . I have one more prednisone for tomorrow my chest is starting to feel heavy and I'm coughing up green nasty stuff. My cough is pretty significant and I have a rattle in my chest and throat. What should I do [facility] I didn't feel good at all I laid in the bed most of the day yesterday. I don't feel very good this morning please let me know what to do thank you for your time it's really early it's like before 7 in the morning I have to get ready to take the children to school I figured I should send this first I think I need some more prednisone look forward to hearing from you soon. I am so glad y'all have this MyChart it's cheers to get me to you a lot quicker than a phone call thank you   Olivia Mckee

## 2018-08-08 NOTE — Telephone Encounter (Signed)
RB please advise on pt email.  Thanks!   I don't have thrush yet but my tongue is getting sore and stinging like it does before I get thrush this just started since I talked to the nurse this morning the sides of my tongue feel funny and it's got that feeling but  I don't see it yet please let her know that she talks to the doctors    Thanks Chamia

## 2018-08-08 NOTE — Telephone Encounter (Signed)
OK with me to give fluconazole 100mg  daily for 3 days  Also Nystatin S&S, TID for 5 days.

## 2018-08-08 NOTE — Telephone Encounter (Signed)
Called and spoke with the patient husband and he verbalized understanding and will have her call back to make appointment.

## 2018-08-09 ENCOUNTER — Other Ambulatory Visit: Payer: Self-pay | Admitting: General Surgery

## 2018-08-09 ENCOUNTER — Ambulatory Visit (INDEPENDENT_AMBULATORY_CARE_PROVIDER_SITE_OTHER): Payer: Commercial Managed Care - PPO | Admitting: Nurse Practitioner

## 2018-08-09 ENCOUNTER — Encounter: Payer: Self-pay | Admitting: Nurse Practitioner

## 2018-08-09 VITALS — BP 144/88 | HR 92 | Ht 61.0 in | Wt 220.0 lb

## 2018-08-09 DIAGNOSIS — J449 Chronic obstructive pulmonary disease, unspecified: Secondary | ICD-10-CM | POA: Diagnosis not present

## 2018-08-09 MED ORDER — GLYCOPYRROLATE-FORMOTEROL 9-4.8 MCG/ACT IN AERO: 2.0000 | INHALATION_SPRAY | Freq: Two times a day (BID) | RESPIRATORY_TRACT | 0 refills | Status: DC

## 2018-08-09 MED ORDER — LEVALBUTEROL HCL 0.63 MG/3ML IN NEBU
0.6300 mg | INHALATION_SOLUTION | Freq: Once | RESPIRATORY_TRACT | Status: AC
  Administered 2018-08-09: 0.63 mg via RESPIRATORY_TRACT

## 2018-08-09 MED ORDER — IPRATROPIUM-ALBUTEROL 0.5-2.5 (3) MG/3ML IN SOLN: 3.0000 mL | Freq: Four times a day (QID) | RESPIRATORY_TRACT | 0 refills | Status: DC | PRN

## 2018-08-09 NOTE — Patient Instructions (Addendum)
Recommended HRCT, RAST Panel, BMP, CBC, and BNP to be check today - patient refused Patient was unable to complete FENO in office D/C trelegy Start back bevespi twice daily Will order duoneb treatments PRN Follow up with Dr. Lamonte Sakai as scheduled

## 2018-08-09 NOTE — Progress Notes (Signed)
@Patient  ID: Berton Mount, female    DOB: 1961-10-09, 57 y.o.   MRN: [id number]  Chief Complaint  Patient presents with  . Follow-up    Not feeling better since last visit.    Referring provider: Blair Heys, PA-C   HPI 57 year old female former smoker (25 pack year) with COPD followed by Dr. Lamonte Sakai.   Tests: Chest xray 08/02/18 - Stable chest x-ray.  No active lung disease.  PFT 10/21/17 FVC 2.57, FEV1 1.79, RATIO  70%, TLC 106%, DLCO 81  Spirometry 08/02/18 FVC 1.4, FEV1 1.0, FEV1/FVC 71%  OV 08/09/18 - Follow up from acute visit - 08/02/18 Patient returns today with continuing shortness of breath and wheezing. She was seen by me on 08/02/18 for these symptoms and was given a second prednisone taper. She states that the prednisone helped when she was on 40 mg daily, but since the taper has finished she has had returning shortness of breath and wheezing. States that symptoms have progressively worsened since finishing prednisone. She was also prescribed trelegy at last, but states that she could not tolerate it because it had a bad taste. Her chest xray at last visit was normal. She denies any fever, sinus congestion, chest pain, or peripheral edema. She has been compliant with Proventil. She was previously on bevespi and tolerated it well. Patient is requesting prednisone daily and states that this is the only thing that will work for her.    Note: Attempted FENO patient unable to complete      Allergies  Allergen Reactions  . Penicillins Anaphylaxis and Rash  . Sulfa Antibiotics Anaphylaxis and Rash    Immunization History  Administered Date(s) Administered  . Influenza, Seasonal, Injecte, Preservative Fre 12/23/2015, 07/31/2016  . Influenza-Unspecified 11/13/2014  . Pneumococcal Polysaccharide-23 03/25/2017  . Tdap 11/28/2013    Past Medical History:  Diagnosis Date  . Alcoholism ([place])   . Anxiety   . Asthma   . Cervical cancer ([employer])   . COPD (chronic  obstructive pulmonary disease) ([place])   . Depression   . Perforated bowel ([place])   . Ventral hernia     Tobacco History: Social History   Tobacco Use  Smoking Status Former Smoker  . Packs/day: 1.00  . Years: 25.00  . Pack years: 25.00  . Types: Cigarettes  . Last attempt to quit: 11/23/2010  . Years since quitting: 7.7  Smokeless Tobacco Never Used   Counseling given: Yes   Outpatient Encounter Medications as of 08/09/2018  Medication Sig  . albuterol (PROVENTIL HFA;VENTOLIN HFA) 108 (90 Base) MCG/ACT inhaler INHALE 2 PUFFS INTO THE LUNGS EVERY 4 (FOUR) HOURS AS NEEDED FOR WHEEZING OR SHORTNESS OF BREATH.  Marland Kitchen ALPRAZolam (XANAX) 0.5 MG tablet Take 1 tablet by mouth 3 (three) times daily as needed.  Marland Kitchen BEVESPI AEROSPHERE 9-4.8 MCG/ACT AERO INHALE 2 PUFFS INTO THE LUNGS 2 TIMES DAILY  . buPROPion (WELLBUTRIN XL) 300 MG 24 hr tablet Take 1 tablet by mouth daily.  . fluconazole (DIFLUCAN) 100 MG tablet Take 1 tablet (100 mg total) by mouth daily.  Marland Kitchen nystatin (MYCOSTATIN) 100000 UNIT/ML suspension Take 5 mLs (500,000 Units total) by mouth 3 (three) times daily. For 5 days  . [DISCONTINUED] Fluticasone-Umeclidin-Vilant (TRELEGY ELLIPTA) 100-62.5-25 MCG/INH AEPB Inhale 1 puff into the lungs daily.  . [DISCONTINUED] predniSONE (DELTASONE) 10 MG tablet Take 4 tabs for 2 days, then 3 tabs for 2 days, then 2 tabs for 2 days, then 1 tab for 2 days, then stop  .  FLUoxetine (PROZAC) 20 MG capsule Take 1 capsule by mouth daily.  . Glycopyrrolate-Formoterol (BEVESPI AEROSPHERE) 9-4.8 MCG/ACT AERO Inhale 2 puffs into the lungs 2 (two) times daily.  Marland Kitchen ipratropium-albuterol (DUONEB) 0.5-2.5 (3) MG/3ML SOLN Take 3 mLs by nebulization every 6 (six) hours as needed.  . [DISCONTINUED] tiotropium (SPIRIVA) 18 MCG inhalation capsule Place 18 mcg into inhaler and inhale daily.  . [EXPIRED] levalbuterol (XOPENEX) nebulizer solution 0.63 mg    No facility-administered encounter medications on file as of  08/09/2018.      Review of Systems  Review of Systems  Constitutional: Negative.  Negative for activity change, appetite change, chills and fever.  HENT: Negative.  Negative for congestion and sinus pain.   Respiratory: Positive for cough, shortness of breath and wheezing.   Cardiovascular: Negative.  Negative for chest pain, palpitations and leg swelling.  Gastrointestinal: Negative.   Allergic/Immunologic: Negative.   Neurological: Negative.   Psychiatric/Behavioral: Negative.        Physical Exam  BP (!) 144/88 (BP Location: Left Arm, Patient Position: Sitting, Cuff Size: Normal)   Pulse 92   Ht 5\' 1"  (1.549 m)   Wt 220 lb (99.8 kg)   SpO2 96%   BMI 41.57 kg/m   Wt Readings from Last 5 Encounters:  08/09/18 220 lb (99.8 kg)  08/02/18 221 lb 6.4 oz (100.4 kg)  10/21/17 219 lb (99.3 kg)  09/08/17 208 lb (94.3 kg)     Physical Exam  Constitutional: She is oriented to person, place, and time. She appears well-developed and well-nourished. No distress.  Cardiovascular: Normal rate and regular rhythm.  Pulmonary/Chest: Effort normal. No respiratory distress. She has wheezes. She has no rales.  Musculoskeletal: She exhibits no edema.  Neurological: She is alert and oriented to person, place, and time.  Psychiatric: She has a normal mood and affect.  Nursing note and vitals reviewed.    Imaging: Dg Chest 2 View  Result Date: 08/02/2018 CLINICAL DATA:  Cough, congestion, short of breath, former smoking history EXAM: CHEST - 2 VIEW COMPARISON:  Chest x-ray of 11/11/2017 FINDINGS: No active infiltrate or effusion is seen. Biapical pleural-parenchymal scarring is noted. Minimally prominent markings at the bases may represent scarring as well. Mediastinal and hilar contours are unremarkable and the heart is within normal limits in size. No acute bony abnormality is seen. IMPRESSION: Stable chest x-ray.  No active lung disease. Electronically Signed   By: Ivar Drape M.D.   On:  08/02/2018 10:41     Assessment & Plan:   Chronic obstructive pulmonary disease (HCC) Patient Instructions  Recommended HRCT, RAST Panel, BMP, CBC, and BNP to be check today - patient refused Patient was unable to complete FENO in office D/C trelegy Start back bevespi twice daily Will order duoneb treatments PRN Follow up with Dr. Lamonte Sakai as scheduled        Discussion: Patient is demanding to be on 40 mg prednisone daily long term. She states that she has researched the medication and it is the only thing that will work for her. I explained the long term effects associated with long term prednisone use. I also explained that she he taken 2 prednisone tapers with out relief and that multiple inhalers have been tried, but she has had reactions to each inhaler except bevespi. I explained to her that Dr. Lamonte Sakai would be happy to see her at his first available appointment to discuss a long term treatment option for her. She was very upset about a return visit.  A sample of bevespi was given and PRN nebulizer ordered. She refused any further work up today. Recommended HRCT, RAST panel, BMP, CBC, and BNP. Patient refused.    Fenton Foy, NP 08/09/2018

## 2018-08-09 NOTE — Assessment & Plan Note (Addendum)
Patient Instructions  Recommended HRCT, RAST Panel, BMP, CBC, and BNP to be check today - patient refused Patient was unable to complete FENO in office D/C trelegy Start back bevespi twice daily Will order duoneb treatments PRN Follow up with Dr. Lamonte Sakai as scheduled

## 2018-08-16 ENCOUNTER — Encounter: Payer: Self-pay | Admitting: Emergency Medicine

## 2018-08-16 ENCOUNTER — Ambulatory Visit (INDEPENDENT_AMBULATORY_CARE_PROVIDER_SITE_OTHER): Payer: Commercial Managed Care - PPO | Admitting: Emergency Medicine

## 2018-08-16 DIAGNOSIS — R053 Chronic cough: Secondary | ICD-10-CM

## 2018-08-16 DIAGNOSIS — J301 Allergic rhinitis due to pollen: Secondary | ICD-10-CM

## 2018-08-16 DIAGNOSIS — J309 Allergic rhinitis, unspecified: Secondary | ICD-10-CM | POA: Insufficient documentation

## 2018-08-16 DIAGNOSIS — R05 Cough: Secondary | ICD-10-CM | POA: Diagnosis not present

## 2018-08-16 DIAGNOSIS — J449 Chronic obstructive pulmonary disease, unspecified: Secondary | ICD-10-CM | POA: Diagnosis not present

## 2018-08-16 DIAGNOSIS — K219 Gastro-esophageal reflux disease without esophagitis: Secondary | ICD-10-CM | POA: Insufficient documentation

## 2018-08-16 MED ORDER — PANTOPRAZOLE SODIUM 40 MG PO TBEC: 40.0000 mg | DELAYED_RELEASE_TABLET | Freq: Two times a day (BID) | ORAL | 2 refills | Status: DC

## 2018-08-16 NOTE — Assessment & Plan Note (Addendum)
Initiated at least on this occasion by an upper respiratory infection about 1 month ago.  Appears to be sustained by chronic rhinitis that is partially treated, GERD that is partially treated.  We will try to address both more aggressively.  If we are unable to resolve her cough by doing so then I think she needs bronchoscopy and an airway inspection.  She ultimately may need ENT evaluation as well since she has a history of nasal polyps.  I would like to avoid giving her more prednisone as it has only helped her temporarily.   Continue to take loratadine 10 mg (your generic allergy pill) once daily every day. Continue Nasonex 2 sprays each nostril twice a day. Please restart your nasal saline rinses once daily until next visit. Depending on how successful we are controlling your nasal drainage and obstruction we will consider sending you back to ENT for evaluation of nasal polyps. Please temporarily stop omeprazole (Prilosec) We will start pantoprazole (Protonix) 40 mg twice a day until next visit. We will give you guidelines and recommendations to help you avoid having reflux. Okay to use your NyQuil at night for cough suppression as directed. Okay to use Alka-Seltzer cold or other over-the-counter decongestants as needed during the day as directed. Please use Tessalon Perles 200 mg up to every 6 hours if needed for cough suppression during the day. Try to practice voice rest and avoid throat clearing.  It may be helpful to use a sugar-free candy or cough drop to soothe your throat and to prevent throat clearing. Follow with Dr Lamonte Sakai in 1 month or next available. If you continue to have cough after doing the above we may decide to perform a procedure called bronchoscopy to do an airway inspection.

## 2018-08-16 NOTE — Assessment & Plan Note (Signed)
Breakthrough symptoms on her current regimen.  We will adjust as above

## 2018-08-16 NOTE — Patient Instructions (Signed)
Please continue Bevespi 2 puffs twice a day as you have been taking it. Keep albuterol available to use 2 puffs up to every 4 hours if needed for shortness of breath. Continue to take loratadine 10 mg (your generic allergy pill) once daily every day. Continue Nasonex 2 sprays each nostril twice a day. Please restart your nasal saline rinses once daily until next visit. Depending on how successful we are controlling your nasal drainage and obstruction we will consider sending you back to ENT for evaluation of nasal polyps. Please temporarily stop omeprazole (Prilosec) We will start pantoprazole (Protonix) 40 mg twice a day until next visit. We will give you guidelines and recommendations to help you avoid having reflux. Okay to use your NyQuil at night for cough suppression as directed. Okay to use Alka-Seltzer cold or other over-the-counter decongestants as needed during the day as directed. Please use Tessalon Perles 200 mg up to every 6 hours if needed for cough suppression during the day. Try to practice voice rest and avoid throat clearing.  It may be helpful to use a sugar-free candy or cough drop to soothe your throat and to prevent throat clearing. Follow with Dr Lamonte Sakai in 1 month or next available. If you continue to have cough after doing the above we may decide to perform a procedure called bronchoscopy to do an airway inspection.

## 2018-08-16 NOTE — Assessment & Plan Note (Signed)
Please continue Bevespi 2 puffs twice a day as you have been taking it. Keep albuterol available to use 2 puffs up to every 4 hours if needed for shortness of breath.

## 2018-08-16 NOTE — Progress Notes (Signed)
Subjective:    Patient ID: Olivia Mckee, female    DOB: 14-Sep-1961, 57 y.o.   MRN: [id number]  HPI  ROV 08/16/18 --this is a follow-up visit for 57 year old smoker with history of obesity, depression/anxiety, migraines, cervical cancer with bowel perforation (ostia and revision).  She has moderate obstruction by spirometry and is been treated with Bevespi for COPD.  She also has severe chronic cough that is likely impacted by her GERD and allergic rhinitis.  Since last time I saw her she has had frequent flares, typically characterized by barking cough.  I treated her with prednisone tapers and attempt was made to change her Bevespi to Trelegy.  This was changed back due to side effects, bad taste. Her prilosec was increased to bid. She is on an allergy pill (? Loratdine). She is on nasonex bid. She has nasal polyps, has some degree nasal obstruction. Began to have more cough about a month ago after a URI. She does have some breakthrough GERD. She has a lot of nasal allergic rhinitis.    Review of Systems  Constitutional: Negative for fever and unexpected weight change.  HENT: Negative for congestion, dental problem, ear pain, nosebleeds, postnasal drip, rhinorrhea, sinus pressure, sneezing, sore throat and trouble swallowing.   Eyes: Negative for redness and itching.  Respiratory: Positive for cough, chest tightness, shortness of breath and wheezing.   Cardiovascular: Negative for palpitations and leg swelling.  Gastrointestinal: Negative for nausea and vomiting.  Genitourinary: Negative for dysuria.  Musculoskeletal: Negative for joint swelling.  Skin: Negative for rash.  Neurological: Negative for headaches.  Hematological: Does not bruise/bleed easily.  Psychiatric/Behavioral: Negative for dysphoric mood. The patient is not nervous/anxious.    Past Medical History:  Diagnosis Date  . Alcoholism ([place])   . Anxiety   . Asthma   . Cervical cancer ([place])   . COPD (chronic obstructive  pulmonary disease) ([place])   . Depression   . Perforated bowel ([place])   . Ventral hernia     Family History  Problem Relation Age of Onset  . Diabetes Mother   . Hypertension Mother   . Diabetes Father   . Stroke Maternal Grandmother   . Kidney disease Maternal Grandmother   . Diabetes Maternal Grandmother   . Heart disease Maternal Grandmother   . Arthritis Maternal Grandmother   . Stroke Maternal Grandfather   . Prostate cancer Maternal Grandfather   . Diabetes Maternal Grandfather   . Heart disease Maternal Grandfather   . Arthritis Maternal Grandfather   . Diabetes Paternal Grandmother   . Diabetes Paternal Grandfather     Diabetes- multiple members Hypertension-mother Stroke-maternal grandmother and maternal grandfather Kidney disease-maternal grandmother Diabetes- multiple members Coronary artery disease-maternal grandmother, maternal grandfather Stroke-maternal grandfather Prostate cancer-maternal grandfather   Social History   Socioeconomic History  . Marital status: Married    Spouse name: Not on file  . Number of children: Not on file  . Years of education: Not on file  . Highest education level: Not on file  Occupational History  . Not on file  Social Needs  . Financial resource strain: Not on file  . Food insecurity:    Worry: Not on file    Inability: Not on file  . Transportation needs:    Medical: Not on file    Non-medical: Not on file  Tobacco Use  . Smoking status: Former Smoker    Packs/day: 1.00    Years: 25.00    Pack years: 25.00  Types: Cigarettes    Last attempt to quit: 11/23/2010    Years since quitting: 7.7  . Smokeless tobacco: Never Used  Substance and Sexual Activity  . Alcohol use: No  . Drug use: No  . Sexual activity: Not on file  Lifestyle  . Physical activity:    Days per week: Not on file    Minutes per session: Not on file  . Stress: Not on file  Relationships  . Social connections:    Talks on phone: Not on  file    Gets together: Not on file    Attends religious service: Not on file    Active member of club or organization: Not on file    Attends meetings of clubs or organizations: Not on file    Relationship status: Not on file  . Intimate partner violence:    Fear of current or ex partner: Not on file    Emotionally abused: Not on file    Physically abused: Not on file    Forced sexual activity: Not on file  Other Topics Concern  . Not on file  Social History Narrative  . Not on file  Former tobacco, 25-pack-years Has lived [place], [place] Has worked Biomedical scientist, Clinical research associate.     Allergies  Allergen Reactions  . Penicillins Anaphylaxis and Rash  . Sulfa Antibiotics Anaphylaxis and Rash     Outpatient Medications Prior to Visit  Medication Sig Dispense Refill  . albuterol (PROVENTIL HFA;VENTOLIN HFA) 108 (90 Base) MCG/ACT inhaler INHALE 2 PUFFS INTO THE LUNGS EVERY 4 (FOUR) HOURS AS NEEDED FOR WHEEZING OR SHORTNESS OF BREATH. 1 Inhaler 0  . ALPRAZolam (XANAX) 0.5 MG tablet Take 1 tablet by mouth 3 (three) times daily as needed.    Marland Kitchen BEVESPI AEROSPHERE 9-4.8 MCG/ACT AERO INHALE 2 PUFFS INTO THE LUNGS 2 TIMES DAILY 1 Inhaler 0  . buPROPion (WELLBUTRIN XL) 300 MG 24 hr tablet Take 1 tablet by mouth daily.    . fluconazole (DIFLUCAN) 100 MG tablet Take 1 tablet (100 mg total) by mouth daily. 3 tablet 0  . Glycopyrrolate-Formoterol (BEVESPI AEROSPHERE) 9-4.8 MCG/ACT AERO Inhale 2 puffs into the lungs 2 (two) times daily. 1 Inhaler 0  . ipratropium-albuterol (DUONEB) 0.5-2.5 (3) MG/3ML SOLN Take 3 mLs by nebulization every 6 (six) hours as needed. 360 mL 0  . nystatin (MYCOSTATIN) 100000 UNIT/ML suspension Take 5 mLs (500,000 Units total) by mouth 3 (three) times daily. For 5 days 75 mL 0  . FLUoxetine (PROZAC) 20 MG capsule Take 1 capsule by mouth daily.     No facility-administered medications prior to visit.         Objective:   Physical Exam Vitals:   08/16/18  1025  BP: 136/80  Pulse: (!) 104  SpO2: 97%  Weight: 221 lb 12.8 oz (100.6 kg)  Height: 5' 4.5" (1.638 m)   Gen: Pleasant, Overweight woman, in no distress,  normal affect  ENT: No lesions,  mouth clear,  oropharynx clear, no postnasal drip  Neck: No JVD, mild inspiratory and expiratory stridor  Lungs: No use of accessory muscles, some referred upper airway noise   Cardiovascular: RRR, heart sounds normal, no murmur or gallops, no peripheral edema  Musculoskeletal: No deformities, no cyanosis or clubbing  Neuro: alert, non focal  Skin: Warm, no lesions or rashes     Assessment & Plan:  Chronic cough Initiated at least on this occasion by an upper respiratory infection about 1 month ago.  Appears to  be sustained by chronic rhinitis that is partially treated, GERD that is partially treated.  We will try to address both more aggressively.  If we are unable to resolve her cough by doing so then I think she needs bronchoscopy and an airway inspection.  She ultimately may need ENT evaluation as well since she has a history of nasal polyps.  I would like to avoid giving her more prednisone as it has only helped her temporarily.   Continue to take loratadine 10 mg (your generic allergy pill) once daily every day. Continue Nasonex 2 sprays each nostril twice a day. Please restart your nasal saline rinses once daily until next visit. Depending on how successful we are controlling your nasal drainage and obstruction we will consider sending you back to ENT for evaluation of nasal polyps. Please temporarily stop omeprazole (Prilosec) We will start pantoprazole (Protonix) 40 mg twice a day until next visit. We will give you guidelines and recommendations to help you avoid having reflux. Okay to use your NyQuil at night for cough suppression as directed. Okay to use Alka-Seltzer cold or other over-the-counter decongestants as needed during the day as directed. Please use Tessalon Perles 200  mg up to every 6 hours if needed for cough suppression during the day. Try to practice voice rest and avoid throat clearing.  It may be helpful to use a sugar-free candy or cough drop to soothe your throat and to prevent throat clearing. Follow with Dr Lamonte Sakai in 1 month or next available. If you continue to have cough after doing the above we may decide to perform a procedure called bronchoscopy to do an airway inspection.  GERD (gastroesophageal reflux disease) Breakthrough symptoms on her current regimen.  We will adjust as above  Allergic rhinitis Persistent symptoms on a nasal steroid, loratadine.  We will add these medication to her medicine list.  I asked her to add back nasal saline washes; she did these remotely after ENT sgy for her nasal polyps.   Chronic obstructive pulmonary disease (HCC) Please continue Bevespi 2 puffs twice a day as you have been taking it. Keep albuterol available to use 2 puffs up to every 4 hours if needed for shortness of breath.   Baltazar Apo, MD, PhD 08/16/2018, 11:19 AM [facility] [phone] or if no answer [phone]

## 2018-08-16 NOTE — Assessment & Plan Note (Signed)
Persistent symptoms on a nasal steroid, loratadine.  We will add these medication to her medicine list.  I asked her to add back nasal saline washes; she did these remotely after ENT sgy for her nasal polyps.

## 2018-08-18 ENCOUNTER — Telehealth: Payer: Self-pay | Admitting: Emergency Medicine

## 2018-08-18 NOTE — Telephone Encounter (Signed)
It is Ok for her to do the [place] bid as long as she tolerates it.  She hasn't been on the GERD therapy long enough for it to take effect - she needs to stick with it reliably.   Have her continue to use the tessalon perles as needed OK with me to give her a script for hycodan 5cc q6h prn cough, 240cc, no RF

## 2018-08-18 NOTE — Telephone Encounter (Signed)
Lmtcb x1 for pt to relay below recommendations. Will route to triage to ensure f/u on 08/19/18   08/18/18 5:01 PM  Note    It is Ok for her to do the [place] bid as long as she tolerates it.  She hasn't been on the GERD therapy long enough for it to take effect - she needs to stick with it reliably.   Have her continue to use the tessalon perles as needed OK with me to give her a script for hycodan 5cc q6h prn cough, 240cc, no RF

## 2018-08-18 NOTE — Telephone Encounter (Signed)
I have called the patient ans asked if she would like an appointment but she has declined since she has just been in office  On 08/16/18.  She is still having a prescient cough though she is taking her Jerrye Bushy medication and Nyquil(every 4hours) She would like to know if there is anything else she can take for her cough?   Dr. Lamonte Sakai  Please advise

## 2018-08-19 ENCOUNTER — Telehealth: Payer: Self-pay | Admitting: Emergency Medicine

## 2018-08-19 MED ORDER — HYDROCODONE-HOMATROPINE 5-1.5 MG/5ML PO SYRP: 5.0000 mL | ORAL_SOLUTION | Freq: Four times a day (QID) | ORAL | 0 refills | Status: DC | PRN

## 2018-08-19 NOTE — Addendum Note (Signed)
Addended by: Jannette Spanner on: 08/19/2018 09:37 AM   Modules accepted: Orders

## 2018-08-19 NOTE — Telephone Encounter (Signed)
Spoke with pt. Advised her that this prescription could not be called in to her pharmacy. She verbalized understanding. Nothing further was needed.

## 2018-08-19 NOTE — Telephone Encounter (Signed)
08/19/18 0931  I am okay signing for Hycodan syrup 5cc q6h prn cough for 120 cc.  I have checked PMP aware.  Patient's overdose risk score is 380.  Patient has multiple other sedating medications such that he has been prescribed in the past.  If patient's symptoms are persisting and she needs a refill for this medication and will need to be an acute visit with our office.  Please advise the patient that the Hycodan syrup is sedating and she should be taking this only at night and not when she is driving or needing to be active during the day.  Patient can take Tessalon Perles during the day these are nonsedating.  I will sign the paper prescription and she can pick it up in office today.  Wyn Quaker FNP  [facility]

## 2018-08-19 NOTE — Telephone Encounter (Signed)
There is a duplicate message on this through [facility]. Jonelle Sidle is going to relay this message to her through [facility].

## 2018-08-19 NOTE — Telephone Encounter (Signed)
Patient returned phone call.Marland KitchenMarland KitchenContact #  [phone]

## 2018-08-25 NOTE — Telephone Encounter (Signed)
Dr. Lamonte Sakai please advise on patient's below email.  I did suggest she come in to see you or an NP.  "Messages for dr. Lamonte Sakai nurse I'm taking my cough medicine with the codeine in it it says Take 5 ml every 6 hours for about four and a half hours is working but then the last hour and a half it's not working at all I'm coughing really really hard I don't know what to do I thought my best bet would be to reach out to you guys and see what you think I should do ice coughing so hard last night I threw up twice thank you very much for your patience with me hope to hear from you later today I've been taking it every 6 hourslikehey prescribed it's always last thing about 4 hours what should I do I can't take it before I go pick up my children from school when I'm supposed to it to have to put that one off until 4 when I get home thank you and tell doctor Byram I'm sorry I was being such a pain thanks Aasia"

## 2018-08-25 NOTE — Telephone Encounter (Signed)
Okay with me to treat her thrush although the diagnosis is unclear to me, could reflect just chronic irritation.  She should not have a fungal process that persists or recurs this frequently.  Give her fluconazole 100 mg daily for 4 days.  If she wants a prescription for nystatin swish and swallow then she can have this as well.  Her cough is complex.  Please ensure that she is taken her medications to treat GERD and allergic rhinitis.  The Hycodan cough syrup should last 6 hours.  If she feels its inadequate we can either change her to Tussionex every 12 hours or add Tessalon Perles 200 mg every 6 hours as needed for cough.  She will need to make an appointment to be seen so that we continue follow-up.  It is still not clear to me that we understand her cough completely.  As above also not clear to me that she is having recurrent thrush

## 2018-08-25 NOTE — Telephone Encounter (Signed)
Now the patient is reporting she also has thrush.   RB please advise.

## 2018-08-26 MED ORDER — FLUCONAZOLE 100 MG PO TABS: 100.0000 mg | ORAL_TABLET | Freq: Every day | ORAL | 0 refills | Status: DC

## 2018-08-26 MED ORDER — BENZONATATE 200 MG PO CAPS: 200.0000 mg | ORAL_CAPSULE | Freq: Three times a day (TID) | ORAL | 1 refills | Status: DC | PRN

## 2018-08-26 NOTE — Telephone Encounter (Signed)
Called and spoke with pt stating to her the information per RB.  Pt stated she had nystatin Rx and I stated to her that I was going to send Rx of diflucan to her pharmacy for her to take to see if that would help with the thrush. Pt expressed understanding.  Also pt stated she still has some of the hycodan that she has been taking. I told her we could change her to tussionex if she wanted or send a Rx of tessalon perles to her pharmacy.  Pt stated she would try tessalon perles to see if that would help. Sent Rx of tessalon to her pharmacy as well.  Pt does have a f/u appt with RB 10/17 which I told her to keep and also stated to her if she felt like she needed to come in sooner for an appt to call our office and we could see if we could move appt up.  Pt expressed understanding. Nothing further needed.

## 2018-09-08 ENCOUNTER — Ambulatory Visit (INDEPENDENT_AMBULATORY_CARE_PROVIDER_SITE_OTHER): Payer: Commercial Managed Care - PPO | Admitting: Emergency Medicine

## 2018-09-08 ENCOUNTER — Encounter: Payer: Self-pay | Admitting: Emergency Medicine

## 2018-09-08 DIAGNOSIS — Z23 Encounter for immunization: Secondary | ICD-10-CM | POA: Diagnosis not present

## 2018-09-08 DIAGNOSIS — J449 Chronic obstructive pulmonary disease, unspecified: Secondary | ICD-10-CM

## 2018-09-08 DIAGNOSIS — R05 Cough: Secondary | ICD-10-CM | POA: Diagnosis not present

## 2018-09-08 DIAGNOSIS — J301 Allergic rhinitis due to pollen: Secondary | ICD-10-CM

## 2018-09-08 DIAGNOSIS — R053 Chronic cough: Secondary | ICD-10-CM

## 2018-09-08 DIAGNOSIS — K219 Gastro-esophageal reflux disease without esophagitis: Secondary | ICD-10-CM | POA: Diagnosis not present

## 2018-09-08 NOTE — Assessment & Plan Note (Signed)
Appears to be stable, no wheezing on exam.  I think that her waxing and waning symptoms flaring symptoms were mostly upper airway in nature.  Continue Bevespi twice daily as you have been taking it. Keep your albuterol available to use 2 puffs up to every 4 hours if needed for shortness of breath, chest tightness, wheezing.

## 2018-09-08 NOTE — Progress Notes (Signed)
Subjective:    Patient ID: Olivia Mckee, female    DOB: 1961-04-04, 57 y.o.   MRN: [id number]  HPI ROV 09/08/18 --57 year old woman with hx tobacco use, multiple medical problems as outlined above, moderate obstructive lung disease by spirometry.  She also has severe chronic cough impacted by her GERD and allergic rhinitis.  The cough was the most problematic symptom at her last visit.  We did a lot of things to try and improve allergic rhinitis and GERD.  She continued loratadine and Nasonex, changed her omeprazole to pantoprazole, talked about avoiding throat clearing.  Since last visit she still had rhinitis so we also restarted nasal saline rinses once daily.  We have been managing her with Bevespi (tolerated better than Trelegy).  Her albuterol use is approximately . She reports that her cough is much better, breathing has improved as well. She still has a globus sensation. She feels that the tessalon has helped a lot. The nasal rinses are irritating her nose some, but is overall tolerating.     Review of Systems  Constitutional: Negative for fever and unexpected weight change.  HENT: Negative for congestion, dental problem, ear pain, nosebleeds, postnasal drip, rhinorrhea, sinus pressure, sneezing, sore throat and trouble swallowing.   Eyes: Negative for redness and itching.  Respiratory: Positive for cough, chest tightness, shortness of breath and wheezing.   Cardiovascular: Negative for palpitations and leg swelling.  Gastrointestinal: Negative for nausea and vomiting.  Genitourinary: Negative for dysuria.  Musculoskeletal: Negative for joint swelling.  Skin: Negative for rash.  Neurological: Negative for headaches.  Hematological: Does not bruise/bleed easily.  Psychiatric/Behavioral: Negative for dysphoric mood. The patient is not nervous/anxious.    Past Medical History:  Diagnosis Date  . Alcoholism ([place])   . Anxiety   . Asthma   . Cervical cancer ([place])   . COPD (chronic  obstructive pulmonary disease) ([place])   . Depression   . Perforated bowel ([place])   . Ventral hernia     Family History  Problem Relation Age of Onset  . Diabetes Mother   . Hypertension Mother   . Diabetes Father   . Stroke Maternal Grandmother   . Kidney disease Maternal Grandmother   . Diabetes Maternal Grandmother   . Heart disease Maternal Grandmother   . Arthritis Maternal Grandmother   . Stroke Maternal Grandfather   . Prostate cancer Maternal Grandfather   . Diabetes Maternal Grandfather   . Heart disease Maternal Grandfather   . Arthritis Maternal Grandfather   . Diabetes Paternal Grandmother   . Diabetes Paternal Grandfather     Diabetes- multiple members Hypertension-mother Stroke-maternal grandmother and maternal grandfather Kidney disease-maternal grandmother Diabetes- multiple members Coronary artery disease-maternal grandmother, maternal grandfather Stroke-maternal grandfather Prostate cancer-maternal grandfather   Social History   Socioeconomic History  . Marital status: Married    Spouse name: Not on file  . Number of children: Not on file  . Years of education: Not on file  . Highest education level: Not on file  Occupational History  . Not on file  Social Needs  . Financial resource strain: Not on file  . Food insecurity:    Worry: Not on file    Inability: Not on file  . Transportation needs:    Medical: Not on file    Non-medical: Not on file  Tobacco Use  . Smoking status: Former Smoker    Packs/day: 1.00    Years: 25.00    Pack years: 25.00    Types:  Cigarettes    Last attempt to quit: 11/23/2010    Years since quitting: 7.7  . Smokeless tobacco: Never Used  Substance and Sexual Activity  . Alcohol use: No  . Drug use: No  . Sexual activity: Not on file  Lifestyle  . Physical activity:    Days per week: Not on file    Minutes per session: Not on file  . Stress: Not on file  Relationships  . Social connections:    Talks on  phone: Not on file    Gets together: Not on file    Attends religious service: Not on file    Active member of club or organization: Not on file    Attends meetings of clubs or organizations: Not on file    Relationship status: Not on file  . Intimate partner violence:    Fear of current or ex partner: Not on file    Emotionally abused: Not on file    Physically abused: Not on file    Forced sexual activity: Not on file  Other Topics Concern  . Not on file  Social History Narrative  . Not on file  Former tobacco, 25-pack-years Has lived [place], [place] Has worked Biomedical scientist, Clinical research associate.     Allergies  Allergen Reactions  . Penicillins Anaphylaxis and Rash  . Sulfa Antibiotics Anaphylaxis and Rash     Outpatient Medications Prior to Visit  Medication Sig Dispense Refill  . albuterol (PROVENTIL HFA;VENTOLIN HFA) 108 (90 Base) MCG/ACT inhaler INHALE 2 PUFFS INTO THE LUNGS EVERY 4 (FOUR) HOURS AS NEEDED FOR WHEEZING OR SHORTNESS OF BREATH. 1 Inhaler 0  . benzonatate (TESSALON) 200 MG capsule Take 1 capsule (200 mg total) by mouth 3 (three) times daily as needed for cough. 30 capsule 1  . BEVESPI AEROSPHERE 9-4.8 MCG/ACT AERO INHALE 2 PUFFS INTO THE LUNGS 2 TIMES DAILY 1 Inhaler 0  . buPROPion (WELLBUTRIN XL) 300 MG 24 hr tablet Take 1 tablet by mouth daily.    . fluconazole (DIFLUCAN) 100 MG tablet Take 1 tablet (100 mg total) by mouth daily. 4 tablet 0  . nystatin (MYCOSTATIN) 100000 UNIT/ML suspension Take 5 mLs (500,000 Units total) by mouth 3 (three) times daily. For 5 days 75 mL 0  . pantoprazole (PROTONIX) 40 MG tablet Take 1 tablet (40 mg total) by mouth 2 (two) times daily. 60 tablet 2  . FLUoxetine (PROZAC) 20 MG capsule Take 1 capsule by mouth daily.    . Glycopyrrolate-Formoterol (BEVESPI AEROSPHERE) 9-4.8 MCG/ACT AERO Inhale 2 puffs into the lungs 2 (two) times daily. (Patient not taking: Reported on 09/08/2018) 1 Inhaler 0  . HYDROcodone-homatropine  (HYCODAN) 5-1.5 MG/5ML syrup Take 5 mLs by mouth every 6 (six) hours as needed for cough. (Patient not taking: Reported on 09/08/2018) 120 mL 0  . ipratropium-albuterol (DUONEB) 0.5-2.5 (3) MG/3ML SOLN Take 3 mLs by nebulization every 6 (six) hours as needed. (Patient not taking: Reported on 09/08/2018) 360 mL 0   No facility-administered medications prior to visit.         Objective:   Physical Exam Vitals:   09/08/18 1159  BP: 128/82  Pulse: (!) 101  SpO2: 97%  Weight: 99.2 kg  Height: 5\' 2"  (1.575 m)   Gen: Pleasant, Overweight woman, in no distress,  normal affect  ENT: No lesions,  mouth clear,  oropharynx clear, no postnasal drip  Neck: No JVD, occasional throat clearing and rare stridor  Lungs: No use of accessory muscles, no wheeze,  no referred upper airway noise today  Cardiovascular: RRR, heart sounds normal, no murmur or gallops, no peripheral edema  Musculoskeletal: No deformities, no cyanosis or clubbing  Neuro: alert, non focal  Skin: Warm, no lesions or rashes     Assessment & Plan:  Chronic cough Continue Bevespi twice daily as you have been taking it. Keep your albuterol available to use 2 puffs up to every 4 hours if needed for shortness of breath, chest tightness, wheezing. Please continue pantoprazole 40 mg twice a day.  Try to take this medication about 1 hour before eating. Continue loratadine 10 mg daily, Nasonex nasal spray 2 sprays each nostril once daily. Try to do your nasal saline washes at least one time a day. Continue to use Tessalon Perles up to every 6 hours if needed for cough suppression. Flu shot today. Pneumonia shot is up-to-date. Follow with Dr Lamonte Sakai in 6 months or sooner if you have any problems  Chronic obstructive pulmonary disease ([place]) Appears to be stable, no wheezing on exam.  I think that her waxing and waning symptoms flaring symptoms were mostly upper airway in nature.  Continue Bevespi twice daily as you have been  taking it. Keep your albuterol available to use 2 puffs up to every 4 hours if needed for shortness of breath, chest tightness, wheezing.  Allergic rhinitis Continue loratadine 10 mg daily, Nasonex nasal spray 2 sprays each nostril once daily. Try to do your nasal saline washes at least one time a day.  GERD (gastroesophageal reflux disease) Please continue pantoprazole 40 mg twice a day.  Try to take this medication about 1 hour before eating.  Baltazar Apo, MD, PhD 09/08/2018, 12:17 PM  [facility] [phone] or if no answer [phone]

## 2018-09-08 NOTE — Assessment & Plan Note (Signed)
Please continue pantoprazole 40 mg twice a day.  Try to take this medication about 1 hour before eating.

## 2018-09-08 NOTE — Assessment & Plan Note (Signed)
Continue Bevespi twice daily as you have been taking it. Keep your albuterol available to use 2 puffs up to every 4 hours if needed for shortness of breath, chest tightness, wheezing. Please continue pantoprazole 40 mg twice a day.  Try to take this medication about 1 hour before eating. Continue loratadine 10 mg daily, Nasonex nasal spray 2 sprays each nostril once daily. Try to do your nasal saline washes at least one time a day. Continue to use Tessalon Perles up to every 6 hours if needed for cough suppression. Flu shot today. Pneumonia shot is up-to-date. Follow with Dr Lamonte Sakai in 6 months or sooner if you have any problems

## 2018-09-08 NOTE — Assessment & Plan Note (Signed)
Continue loratadine 10 mg daily, Nasonex nasal spray 2 sprays each nostril once daily. Try to do your nasal saline washes at least one time a day.

## 2018-09-08 NOTE — Patient Instructions (Addendum)
Continue Bevespi twice daily as you have been taking it. Keep your albuterol available to use 2 puffs up to every 4 hours if needed for shortness of breath, chest tightness, wheezing. Please continue pantoprazole 40 mg twice a day.  Try to take this medication about 1 hour before eating. Continue loratadine 10 mg daily, Nasonex nasal spray 2 sprays each nostril once daily. Try to do your nasal saline washes at least one time a day. Continue to use Tessalon Perles up to every 6 hours if needed for cough suppression. Flu shot today. Pneumonia shot is up-to-date. Follow with Dr Lamonte Sakai in 6 months or sooner if you have any problems

## 2018-09-09 ENCOUNTER — Telehealth: Payer: Self-pay | Admitting: Emergency Medicine

## 2018-09-09 MED ORDER — BENZONATATE 200 MG PO CAPS: 200.0000 mg | ORAL_CAPSULE | Freq: Three times a day (TID) | ORAL | 0 refills | Status: DC | PRN

## 2018-09-09 NOTE — Telephone Encounter (Signed)
Spoke with the pt  She states started coughing some again today Non prod  She is asking if ok to take the tessalon pearles for this  I advised that this is fine per AVS from yesterday's visit  I sent her a refill since she only has a few caps left  She is to call if not improving with this

## 2018-09-20 ENCOUNTER — Ambulatory Visit: Payer: Commercial Managed Care - PPO | Admitting: Emergency Medicine

## 2018-10-17 ENCOUNTER — Telehealth: Payer: Self-pay | Admitting: Emergency Medicine

## 2018-10-17 MED ORDER — DOXYCYCLINE HYCLATE 100 MG PO TABS: 100.0000 mg | ORAL_TABLET | Freq: Two times a day (BID) | ORAL | 0 refills | Status: DC

## 2018-10-17 MED ORDER — PREDNISONE 10 MG PO TABS: 10.0000 mg | ORAL_TABLET | Freq: Two times a day (BID) | ORAL | 0 refills | Status: DC

## 2018-10-17 NOTE — Telephone Encounter (Signed)
Called and spoke with patient, she stated that she started feeling bad on Saturday night. She is having trouble breathing, increased SOB. She stated that she is having a productive cough with green thick mucus and body aches. Patient states that she feels overall "terrible". Patient denies fever, no sneezing. Patient refused appointment.    Dr. Lamonte Sakai please advise, thank you.

## 2018-10-17 NOTE — Telephone Encounter (Signed)
Aaron Edelman, Please advise in lieu of RB absence. Thank you.

## 2018-10-17 NOTE — Telephone Encounter (Signed)
Sorry the patient is a feeling well.  Can offer patient:  Doxycycline >>> 1 100 mg tablet every 12 hours for 7 days >>>take with food  >>>wear sunscreen   Prednisone 10mg  tablet  >>>Take 2 tablets (20 mg total) daily for the next 5 days >>> Take with food in the morning   If symptoms are not improving under this regimen the patient will need office visit for further evaluation.  Wyn Quaker, FNP

## 2018-10-17 NOTE — Telephone Encounter (Signed)
Called and spoke with patient, she is aware and verbalized understanding. Medications sent in. Nothing further needed.

## 2018-10-17 NOTE — Telephone Encounter (Signed)
Thank you   Karess Harner  

## 2018-12-14 ENCOUNTER — Other Ambulatory Visit: Payer: Self-pay | Admitting: Emergency Medicine

## 2019-02-03 ENCOUNTER — Telehealth: Payer: Self-pay | Admitting: Emergency Medicine

## 2019-02-03 MED ORDER — ALBUTEROL SULFATE HFA 108 (90 BASE) MCG/ACT IN AERS: 2.0000 | INHALATION_SPRAY | RESPIRATORY_TRACT | 1 refills | Status: DC | PRN

## 2019-02-03 MED ORDER — GLYCOPYRROLATE-FORMOTEROL 9-4.8 MCG/ACT IN AERO: 2.0000 | INHALATION_SPRAY | Freq: Two times a day (BID) | RESPIRATORY_TRACT | 3 refills | Status: DC

## 2019-02-03 NOTE — Telephone Encounter (Signed)
I completely agree with these recs. Good hand washing and using sanitizer are key. I am recommending that pt's avoid crowds, reschedule WELL follow up appointmernts. IF pt's get viral symptoms, fever, then they should call the office before being seen so that we can prepare for their arrival.

## 2019-02-03 NOTE — Telephone Encounter (Signed)
Called pt and advised message from the provider. Pt understood and verbalized understanding. Nothing further is needed.    

## 2019-02-03 NOTE — Telephone Encounter (Signed)
Spoke with pt, she is wondering if she should take any other precautions for her with the Covid-19 virus. I explained to her the basics such as washing hands, avoid touching you face, avoid large crowds, and avoid people who are sick. RB do you have any other recommendations for pt? Please advise.

## 2019-02-23 ENCOUNTER — Telehealth: Payer: Self-pay | Admitting: Emergency Medicine

## 2019-02-23 NOTE — Telephone Encounter (Signed)
Pharmacy stated pt had a $3700 deductible.  Instructed pt to go online to see if there is co-payment assistance for pt to call back if we can do anything else.  Nothing further is needed.

## 2019-02-23 NOTE — Telephone Encounter (Signed)
Spoke with pt.  Pt stated bevespi is working well but it is now $1000 for 3 months supply.  Pt has tried spiriva and trelegy but does not want again as it gives her thrush.  Tonya, please advise.

## 2019-02-23 NOTE — Telephone Encounter (Signed)
Please have her check with insurance if stiolto would be covered and let us know.

## 2019-02-24 ENCOUNTER — Telehealth: Payer: Self-pay | Admitting: Emergency Medicine

## 2019-02-24 MED ORDER — BUDESONIDE-FORMOTEROL FUMARATE 160-4.5 MCG/ACT IN AERO: 2.0000 | INHALATION_SPRAY | Freq: Two times a day (BID) | RESPIRATORY_TRACT | 5 refills | Status: DC

## 2019-02-24 NOTE — Telephone Encounter (Signed)
Symbicort Rx sent to pt's pharmacy. Called and spoke with Danae Chen letting her know that we were going to do this and Erica expressed understanding and stated she would let pt know. I discontinued Bevespi off of pt's med list. Nothing further needed.

## 2019-02-24 NOTE — Telephone Encounter (Signed)
Returned call to [place]. Pt currently on Bevespi which is not on her preferred drug plan. Covered inahalers are: Advair HFA and Symbicort. Please advise of change?  [id number] Dr. Lamonte Sakai pt last office visit 09/08/18.

## 2019-02-24 NOTE — Telephone Encounter (Signed)
If those are the only 2 covered then we can trial symbicort 160/4.5. please order.

## 2019-03-09 ENCOUNTER — Other Ambulatory Visit: Payer: Self-pay | Admitting: Emergency Medicine

## 2019-06-06 DIAGNOSIS — K760 Fatty (change of) liver, not elsewhere classified: Secondary | ICD-10-CM | POA: Insufficient documentation

## 2019-07-26 DIAGNOSIS — E782 Mixed hyperlipidemia: Secondary | ICD-10-CM | POA: Insufficient documentation

## 2019-08-16 ENCOUNTER — Other Ambulatory Visit: Payer: Self-pay | Admitting: Emergency Medicine

## 2019-09-03 ENCOUNTER — Other Ambulatory Visit: Payer: Self-pay | Admitting: Emergency Medicine

## 2019-10-04 ENCOUNTER — Telehealth: Payer: Self-pay | Admitting: Emergency Medicine

## 2019-10-04 MED ORDER — BEVESPI AEROSPHERE 9-4.8 MCG/ACT IN AERO: 2.0000 | INHALATION_SPRAY | Freq: Two times a day (BID) | RESPIRATORY_TRACT | 0 refills | Status: DC

## 2019-10-04 NOTE — Telephone Encounter (Signed)
Spoke with the pt  She states that her bevespi inhaler is not "puffing any stuff out"  She states that she needs new inhaler sent to pharm  She has an appt with RB for 10/31/19  Rx sent  Will call if still having any issues

## 2019-10-27 NOTE — Telephone Encounter (Signed)
[facility] sent a letter stating the Bevespi inhaler will no longer be covered and the alternatives are Anoro and Stiolto. Pt has an appointment with [facility] on 10/29/2019. FYI RB

## 2019-10-27 NOTE — Telephone Encounter (Signed)
Thank you :)

## 2019-10-31 ENCOUNTER — Other Ambulatory Visit: Payer: Self-pay

## 2019-10-31 ENCOUNTER — Ambulatory Visit (INDEPENDENT_AMBULATORY_CARE_PROVIDER_SITE_OTHER): Payer: Commercial Managed Care - PPO | Admitting: Emergency Medicine

## 2019-10-31 ENCOUNTER — Encounter: Payer: Self-pay | Admitting: Emergency Medicine

## 2019-10-31 DIAGNOSIS — K219 Gastro-esophageal reflux disease without esophagitis: Secondary | ICD-10-CM

## 2019-10-31 DIAGNOSIS — R053 Chronic cough: Secondary | ICD-10-CM

## 2019-10-31 DIAGNOSIS — R05 Cough: Secondary | ICD-10-CM

## 2019-10-31 DIAGNOSIS — J449 Chronic obstructive pulmonary disease, unspecified: Secondary | ICD-10-CM

## 2019-10-31 NOTE — Progress Notes (Signed)
Subjective:    Patient ID: Olivia Mckee, female    DOB: 1960-12-27, 58 y.o.   MRN: [id number]  HPI ROV 09/08/18 --58 year old woman with hx tobacco use, multiple medical problems as outlined above, moderate obstructive lung disease by spirometry.  She also has severe chronic cough impacted by her GERD and allergic rhinitis.  The cough was the most problematic symptom at her last visit.  We did a lot of things to try and improve allergic rhinitis and GERD.  She continued loratadine and Nasonex, changed her omeprazole to pantoprazole, talked about avoiding throat clearing.  Since last visit she still had rhinitis so we also restarted nasal saline rinses once daily.  We have been managing her with Bevespi (tolerated better than Trelegy).  Her albuterol use is approximately . She reports that her cough is much better, breathing has improved as well. She still has a globus sensation. She feels that the tessalon has helped a lot. The nasal rinses are irritating her nose some, but is overall tolerating.    ROV 10/31/2019 --follow-up visit 58 year old woman with a history of moderate COPD, chronic cough with a history of GERD and allergic rhinitis.  She has been managed on Bevespi, albuterol which she uses approximately .  She noted that her [name] ran out, wasn't working. We got her a new one. Some info that she may need an alternative after 11/24/19.  She is on Protonix once daily. Minimal cough now. Not really needing tessalon right now.    Review of Systems  Constitutional: Negative for fever and unexpected weight change.  HENT: Negative for congestion, dental problem, ear pain, nosebleeds, postnasal drip, rhinorrhea, sinus pressure, sneezing, sore throat and trouble swallowing.   Eyes: Negative for redness and itching.  Respiratory: Positive for shortness of breath and wheezing. Negative for cough and chest tightness.   Cardiovascular: Negative for palpitations and leg swelling.  Gastrointestinal:  Negative for nausea and vomiting.  Genitourinary: Negative for dysuria.  Musculoskeletal: Negative for joint swelling.  Skin: Negative for rash.  Neurological: Negative for headaches.  Hematological: Does not bruise/bleed easily.  Psychiatric/Behavioral: Negative for dysphoric mood. The patient is not nervous/anxious.    Past Medical History:  Diagnosis Date  . Alcoholism ([place])   . Anxiety   . Asthma   . Cervical cancer ([place])   . COPD (chronic obstructive pulmonary disease) ([place])   . Depression   . Perforated bowel ([place])   . Ventral hernia     Family History  Problem Relation Age of Onset  . Diabetes Mother   . Hypertension Mother   . Diabetes Father   . Stroke Maternal Grandmother   . Kidney disease Maternal Grandmother   . Diabetes Maternal Grandmother   . Heart disease Maternal Grandmother   . Arthritis Maternal Grandmother   . Stroke Maternal Grandfather   . Prostate cancer Maternal Grandfather   . Diabetes Maternal Grandfather   . Heart disease Maternal Grandfather   . Arthritis Maternal Grandfather   . Diabetes Paternal Grandmother   . Diabetes Paternal Grandfather     Diabetes- multiple members Hypertension-mother Stroke-maternal grandmother and maternal grandfather Kidney disease-maternal grandmother Diabetes- multiple members Coronary artery disease-maternal grandmother, maternal grandfather Stroke-maternal grandfather Prostate cancer-maternal grandfather   Social History   Socioeconomic History  . Marital status: Married    Spouse name: Not on file  . Number of children: Not on file  . Years of education: Not on file  . Highest education level: Not on file  Occupational  History  . Not on file  Social Needs  . Financial resource strain: Not on file  . Food insecurity    Worry: Not on file    Inability: Not on file  . Transportation needs    Medical: Not on file    Non-medical: Not on file  Tobacco Use  . Smoking status: Former Smoker     Packs/day: 1.00    Years: 25.00    Pack years: 25.00    Types: Cigarettes    Quit date: 11/23/2010    Years since quitting: 8.9  . Smokeless tobacco: Never Used  Substance and Sexual Activity  . Alcohol use: No  . Drug use: No  . Sexual activity: Not on file  Lifestyle  . Physical activity    Days per week: Not on file    Minutes per session: Not on file  . Stress: Not on file  Relationships  . Social Herbalist on phone: Not on file    Gets together: Not on file    Attends religious service: Not on file    Active member of club or organization: Not on file    Attends meetings of clubs or organizations: Not on file    Relationship status: Not on file  . Intimate partner violence    Fear of current or ex partner: Not on file    Emotionally abused: Not on file    Physically abused: Not on file    Forced sexual activity: Not on file  Other Topics Concern  . Not on file  Social History Narrative  . Not on file  Former tobacco, 25-pack-years Has lived [place], [place] Has worked Biomedical scientist, Clinical research associate.     Allergies  Allergen Reactions  . Penicillins Anaphylaxis and Rash  . Sulfa Antibiotics Anaphylaxis and Rash     Outpatient Medications Prior to Visit  Medication Sig Dispense Refill  . albuterol (PROVENTIL HFA;VENTOLIN HFA) 108 (90 Base) MCG/ACT inhaler Inhale 2 puffs into the lungs every 4 (four) hours as needed for wheezing or shortness of breath. 1 Inhaler 1  . benzonatate (TESSALON) 200 MG capsule Take 1 capsule (200 mg total) by mouth 3 (three) times daily as needed for cough. 30 capsule 0  . Glycopyrrolate-Formoterol (BEVESPI AEROSPHERE) 9-4.8 MCG/ACT AERO Inhale 2 puffs into the lungs 2 (two) times daily. 5.9 g 0  . meloxicam (MOBIC) 15 MG tablet Take 15 mg by mouth daily.    . Naltrexone-buPROPion HCl ER (CONTRAVE) 8-90 MG TB12 Take 2 tablets by mouth 2 (two) times daily.    . pantoprazole (PROTONIX) 40 MG tablet TAKE 1 TABLET BY MOUTH  TWICE A DAY 180 tablet 0  . buPROPion (WELLBUTRIN XL) 300 MG 24 hr tablet Take 1 tablet by mouth daily.    Marland Kitchen nystatin (MYCOSTATIN) 100000 UNIT/ML suspension Take 5 mLs (500,000 Units total) by mouth 3 (three) times daily. For 5 days 75 mL 0  . budesonide-formoterol (SYMBICORT) 160-4.5 MCG/ACT inhaler Inhale 2 puffs into the lungs 2 (two) times daily. 1 Inhaler 5  . doxycycline (VIBRA-TABS) 100 MG tablet Take 1 tablet (100 mg total) by mouth 2 (two) times daily. 14 tablet 0  . fluconazole (DIFLUCAN) 100 MG tablet Take 1 tablet (100 mg total) by mouth daily. 4 tablet 0  . FLUoxetine (PROZAC) 20 MG capsule Take 1 capsule by mouth daily.    . predniSONE (DELTASONE) 10 MG tablet Take 1 tablet (10 mg total) by mouth 2 (two) times daily  with a meal. 10 tablet 0   No facility-administered medications prior to visit.         Objective:   Physical Exam Vitals:   10/31/19 1526  BP: 126/74  Pulse: (!) 101  SpO2: 99%  Weight: 230 lb (104.3 kg)  Height: 5\' 3"  (1.6 m)   Gen: Pleasant, Overweight woman, in no distress,  normal affect  ENT: No lesions,  mouth clear,  oropharynx clear, no postnasal drip  Neck: No JVD, no UA noise today  Lungs: No use of accessory muscles, no wheeze on a normal breath, but present on a forced exp  Cardiovascular: RRR, heart sounds normal, no murmur or gallops, no peripheral edema  Musculoskeletal: No deformities, no cyanosis or clubbing  Neuro: alert, non focal  Skin: Warm, no lesions or rashes     Assessment & Plan:  Chronic obstructive pulmonary disease ([place]) Please continue Bevespi 2 puffs twice a day.  Check with your pharmacy to make sure that you will have adequate coverage for this medication after the new year.  If not then we will try to find a substitution, possibly Stiolto or Anoro. Keep albuterol available to use 2 puffs if needed for shortness of breath, chest tightness, wheezing. Flu shot up-to-date Pneumonia shot up-to-date Follow with Dr.  Lamonte Sakai in 12 months or sooner if you have any problems.  GERD (gastroesophageal reflux disease) Continue Protonix (pantoprazole) 40 mg once daily.  Chronic cough Continue to treat her GERD and rhinitis. We can refill her tessalon going forward if she needs it  Baltazar Apo, MD, PhD 10/31/2019, 5:49 PM [facility] [phone] or if no answer [phone]

## 2019-10-31 NOTE — Assessment & Plan Note (Signed)
Continue to treat her GERD and rhinitis. We can refill her tessalon going forward if she needs it

## 2019-10-31 NOTE — Assessment & Plan Note (Signed)
Continue Protonix (pantoprazole) 40 mg once daily.

## 2019-10-31 NOTE — Patient Instructions (Addendum)
Please continue Bevespi 2 puffs twice a day.  Check with your pharmacy to make sure that you will have adequate coverage for this medication after the new year.  If not then we will try to find a substitution, possibly Stiolto or Anoro. Keep albuterol available to use 2 puffs if needed for shortness of breath, chest tightness, wheezing. Continue Protonix (pantoprazole) 40 mg once daily. Flu shot up-to-date Pneumonia shot up-to-date Follow with Dr. Lamonte Sakai in 12 months or sooner if you have any problems.

## 2019-10-31 NOTE — Assessment & Plan Note (Signed)
Please continue Bevespi 2 puffs twice a day.  Check with your pharmacy to make sure that you will have adequate coverage for this medication after the new year.  If not then we will try to find a substitution, possibly Stiolto or Anoro. Keep albuterol available to use 2 puffs if needed for shortness of breath, chest tightness, wheezing. Flu shot up-to-date Pneumonia shot up-to-date Follow with Dr. Lamonte Sakai in 12 months or sooner if you have any problems.

## 2019-11-07 ENCOUNTER — Telehealth: Payer: Self-pay | Admitting: Emergency Medicine

## 2019-11-07 DIAGNOSIS — J449 Chronic obstructive pulmonary disease, unspecified: Secondary | ICD-10-CM

## 2019-11-07 DIAGNOSIS — K219 Gastro-esophageal reflux disease without esophagitis: Secondary | ICD-10-CM

## 2019-11-07 MED ORDER — BEVESPI AEROSPHERE 9-4.8 MCG/ACT IN AERO: 2.0000 | INHALATION_SPRAY | Freq: Two times a day (BID) | RESPIRATORY_TRACT | 1 refills | Status: DC

## 2019-11-07 NOTE — Telephone Encounter (Signed)
Spoke with patient. She stated that she would like to establish primary care with a different provider. She currently sees a PA at a [place] location in [facility] and she is not happy with the stuff. While on the phone with her, I pulled up the map to see which [place] location would be the closest for her. She agreed on the [place] at [facility] location. She wants to know if RB would refer her.    She also wanted to know if RB would be ok with sending in a 90 day supply to her pharmacy before December 31st. Her insurance will not cover [place] after the 31st. Did explain to her that we had a new inhaler named Judithann Sauger that was similar to the [place] that could be an option moving forward.   RB, please advise. Thanks!

## 2019-11-07 NOTE — Telephone Encounter (Signed)
Ok to fill the bevespi for 90 days  Also, please refer her to provider at [place] at [facility]. Thanks

## 2019-11-07 NOTE — Telephone Encounter (Signed)
Pt aware of recs.  Referral placed.  rx ordered. Nothing further needed at this time- will close encounter.

## 2019-11-14 ENCOUNTER — Other Ambulatory Visit: Payer: Self-pay | Admitting: Emergency Medicine

## 2019-11-20 ENCOUNTER — Telehealth: Payer: Self-pay | Admitting: Emergency Medicine

## 2019-11-20 DIAGNOSIS — J449 Chronic obstructive pulmonary disease, unspecified: Secondary | ICD-10-CM

## 2019-11-20 NOTE — Telephone Encounter (Signed)
Spoke with patient. She stated that [name] will not be covered after 12/31. She was given the names of two inhalers at her last OV but forgot the names of them. Provided her with the names of the inhalers (Stiolto or Anoro). She wants to know which one RB would recommend for her to be on. She is concerned about switching inhalers since she is prone to developing thrush easily.   I did inform patient that depending on what inhaler RB recommends for her, we could provide her a one time sample before she puts up the RX from pharmacy.   RB, please advise if you would like for her to switch to Stiolto or Anoro. Thanks!

## 2019-11-20 NOTE — Telephone Encounter (Signed)
I would prefer Stiolto if it is covered by her insurance.

## 2019-11-21 MED ORDER — STIOLTO RESPIMAT 2.5-2.5 MCG/ACT IN AERS: 2.0000 | INHALATION_SPRAY | Freq: Every day | RESPIRATORY_TRACT | 3 refills | Status: DC

## 2019-11-21 NOTE — Telephone Encounter (Signed)
I called that patient and advised of the response received from Dr. Lamonte Sakai. Patient agreed to try the medication and will call if the price for the Stiolto is too high. I suggested she try the manufacturer webtiste, goodrx or singlecare to see if she can find a coupon to offset the cost.   Pharmacy was confirmed and prescription sent in. Advised the patient I would send with 3 refills so that if the cost is too high, there are no a bunch of refills attached, and if it works we can always send in another refill request.   Nothing further needed at this time.

## 2019-12-04 IMAGING — DX DG CHEST 2V
2 series · 2 of 2 positions shown · non-contrast
Comparison: Chest x-ray of 11/11/2017

CLINICAL DATA: Cough, congestion, short of breath, former smoking
history

EXAM:
CHEST - 2 VIEW

[chest pa]
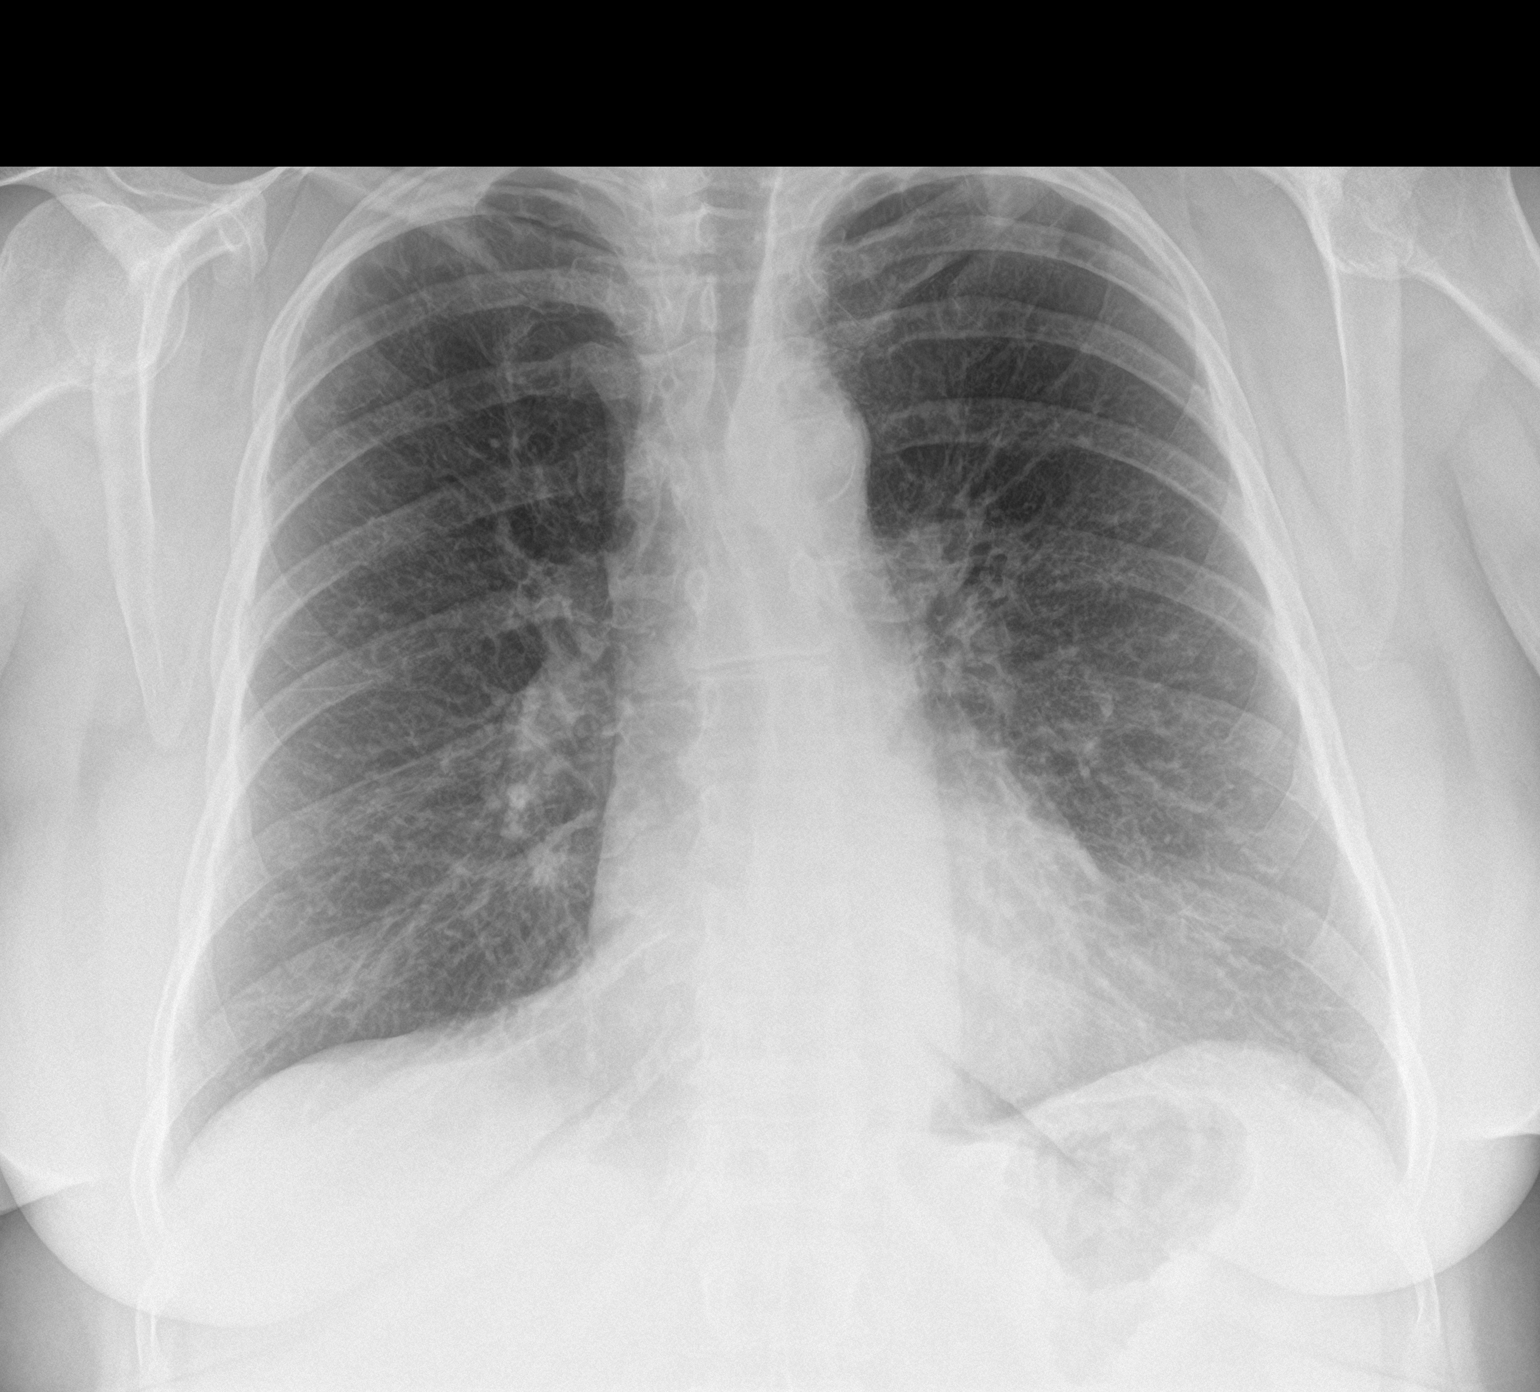

[chest lat]
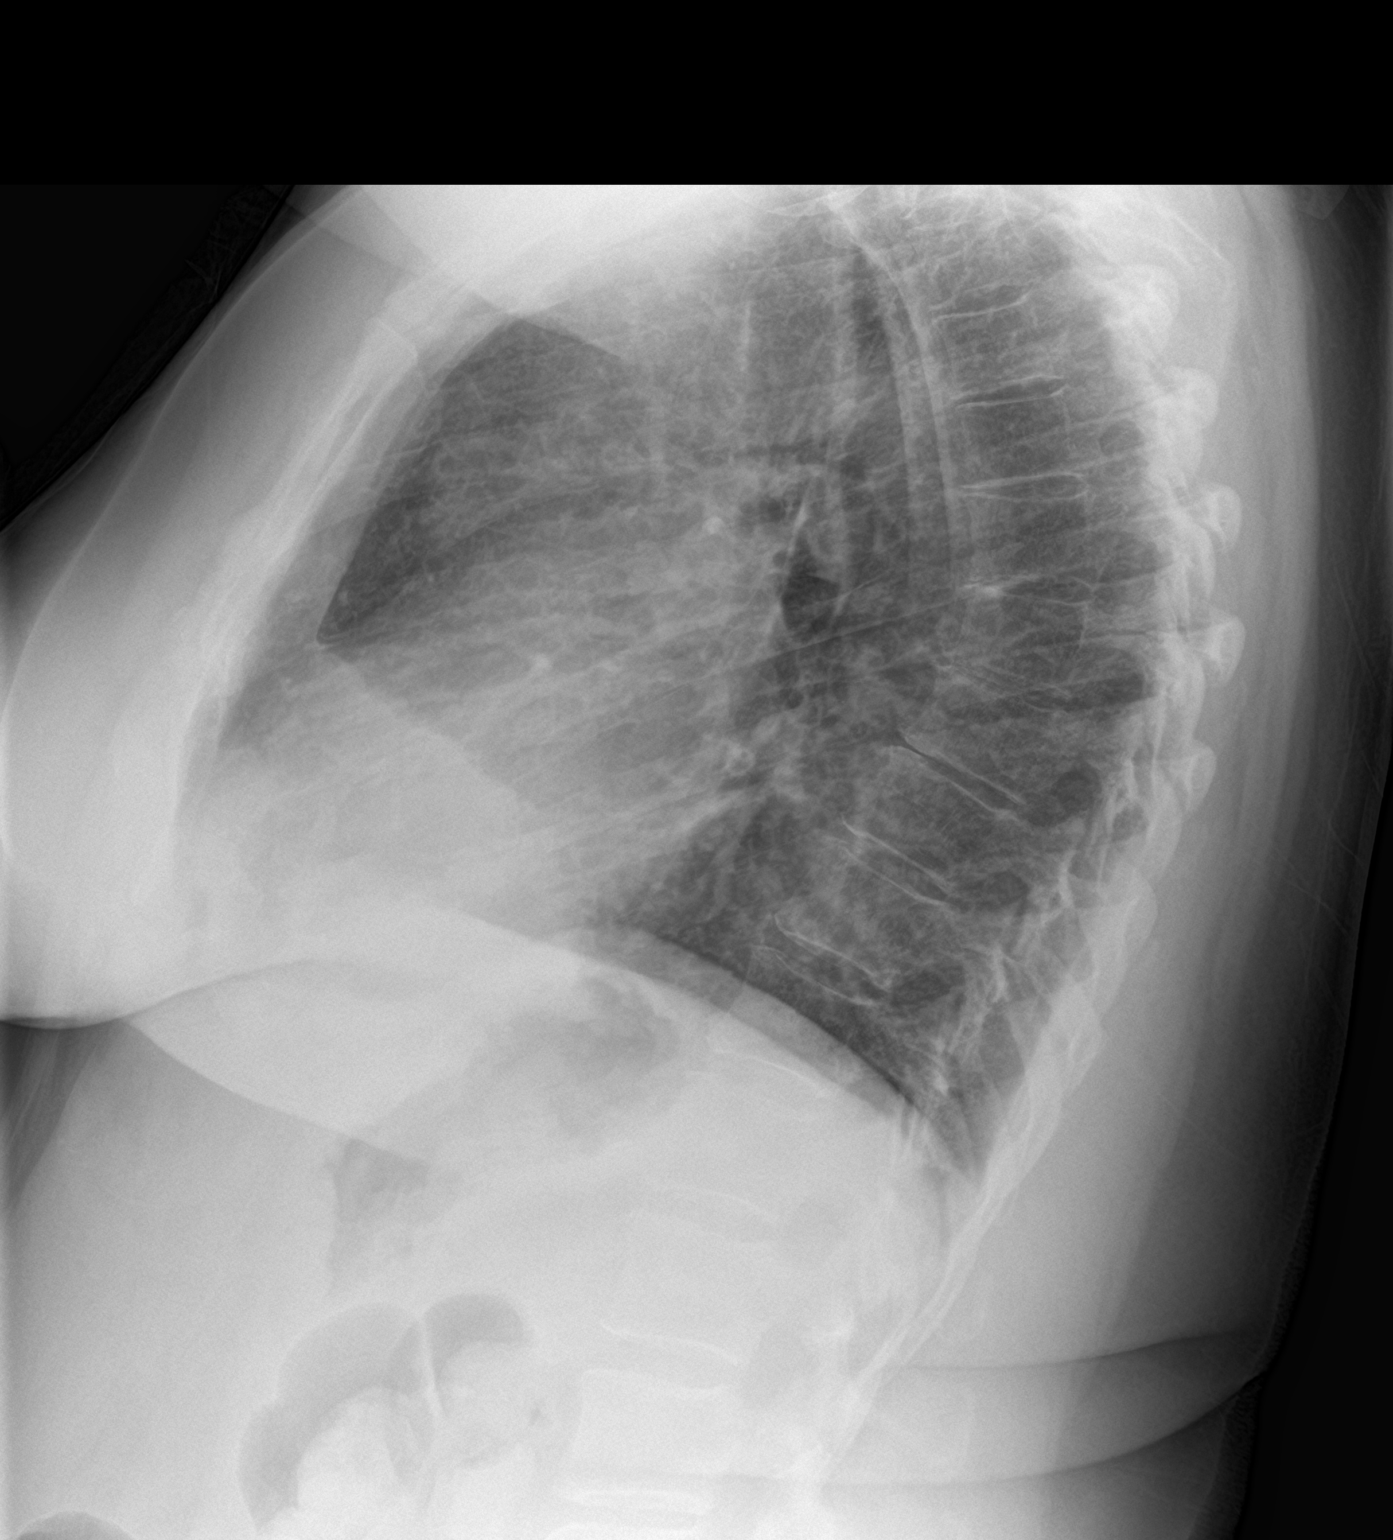

[2 of 2 positions shown; findings below may reference images not displayed]

FINDINGS: No active infiltrate or effusion is seen. Biapical
pleural-parenchymal scarring is noted. Minimally prominent markings
at the bases may represent scarring as well. Mediastinal and hilar
contours are unremarkable and the heart is within normal limits in
size. No acute bony abnormality is seen.
IMPRESSION: Stable chest x-ray.  No active lung disease.

## 2019-12-20 ENCOUNTER — Telehealth: Payer: Self-pay | Admitting: Emergency Medicine

## 2019-12-20 MED ORDER — STIOLTO RESPIMAT 2.5-2.5 MCG/ACT IN AERS: 2.0000 | INHALATION_SPRAY | Freq: Every day | RESPIRATORY_TRACT | 0 refills | Status: DC

## 2019-12-20 NOTE — Telephone Encounter (Signed)
Called and spoke with the pt  She is asking if we have every heard of "prescription patient assistance program" She would have to pay a 1 time fee of $ 50 and then she could get her inhaler for $39 per month  I advised that I have never heard of this  Sounds like a scam  I will send her real BI Cares forms to fill out  2 samples left of Stiolto left up front along with forms

## 2019-12-22 ENCOUNTER — Telehealth: Payer: Self-pay | Admitting: Emergency Medicine

## 2019-12-22 DIAGNOSIS — J449 Chronic obstructive pulmonary disease, unspecified: Secondary | ICD-10-CM

## 2019-12-22 NOTE — Telephone Encounter (Signed)
I will complete this for her when I am back in office.  There is only one strength for Stiolto, she is on the standard dose

## 2019-12-22 NOTE — Telephone Encounter (Signed)
Spoke with pt  She states she has filled out and faxed back her portion of the BI cares forms for Stiolto  She is needing RB to sign page 5 and faxed fax  I have this filled out and placed in RB's folder  Please advise once done and we can fax, thanks

## 2019-12-25 ENCOUNTER — Other Ambulatory Visit: Payer: Self-pay | Admitting: Emergency Medicine

## 2019-12-25 DIAGNOSIS — Z87448 Personal history of other diseases of urinary system: Secondary | ICD-10-CM

## 2019-12-25 HISTORY — DX: Personal history of other diseases of urinary system: Z87.448

## 2019-12-25 NOTE — Telephone Encounter (Signed)
Patient states needs prior authorization for Bevespi.  Needs 3 month supply.  [facility] phone number [phone]. Fax number [phone]. [facility] [place] [place].  Patient phone number is [phone].

## 2019-12-25 NOTE — Telephone Encounter (Signed)
Pt called back to state that the pharmacy states that if Dr. Lamonte Sakai calls in a 3 month supply, her insurance will be more likely to give it at the lower price.- she has the discount card that the pharmacy also has - if Dr. Lamonte Sakai calls in the 3 month supply, things should work out. CB# [phone]

## 2019-12-25 NOTE — Telephone Encounter (Signed)
Yes, she can go back to bevespi if this works better for her

## 2019-12-25 NOTE — Telephone Encounter (Signed)
Called spoke with patient to discuss RB's response below.  Patient interrupted stating that there have been some new developments since she called last week:  Patient stated she does not like the Stiolto and feels that it does not control her symptoms - she is "getting the wheezing back in her throat" and felt the Bevespi controlled her symptoms better.  She did find an old Bevespi at home and has been taking this.  Patient was only changed to [employer] because of insurance coverage, however she was informed earlier this 2021 year that while the [place] is covered, it will still cost her approx $600.  Patient called [employer] this morning to see if they could offer her any assistance.  She was informed that they would be able to provide her Bevespi 30 days supply for $99 for the 1st month, then $49/mo there after which she states she can afford.  Patient would like to cancel the [facility] for the [facility] and resume the [place].  She is awaiting a call back from [place] to find out how she needs to proceed.  If RB is okay with her returning to [place], we can close this message and a new one will be opened once she hears back from [place].  Dr Lamonte Sakai please advise, thank you.

## 2019-12-25 NOTE — Telephone Encounter (Signed)
Spoke with patient. She was very upset that her insurance has given her the runaround with her medication. She has been on the phone with both [facility] and [facility]. [facility] called her a few minutes ago and told her that she needs a PA on the [place]. I advised her that I would attempt the PA for her, she verbalized understanding.   She has tried [employer], Symbicort 160/4.68mcg, Trelegy 135mcg and Spiriva 39mcg. Stiolto has actually increased her SOB. Symbicort, Trelegy and Spiriva all caused her to develop thrush. This information has been submitted the insurance via the PA.   PA# for [email or web address] [id number].   Will check on status of PA tomorrow.

## 2019-12-25 NOTE — Telephone Encounter (Signed)
Pt now states that we will have to call her mobile number instead of the house number - PLEASE CALL [phone]

## 2019-12-27 MED ORDER — ANORO ELLIPTA 62.5-25 MCG/INH IN AEPB: 1.0000 | INHALATION_SPRAY | Freq: Every day | RESPIRATORY_TRACT | 0 refills | Status: DC

## 2019-12-27 NOTE — Telephone Encounter (Signed)
Checked CMM and the [name] has been denied stating pt needs to try and fail Stiolto and Anoro. Pt Olivia Mckee's documentation pt has already tried and failed Stiolto.   Dr. Lamonte Sakai please advise if ok to change to Anoro to try. Thanks.

## 2019-12-27 NOTE — Telephone Encounter (Signed)
I agree with trying Anoro as long as the patient is willing

## 2019-12-27 NOTE — Telephone Encounter (Signed)
Called the patient and advised of the response received. Confirmed with the patient she was not issued Anoro before based on the current and prior medication list.   I offered the patient samples of Anoro to be use first. I told her that if the medication works for her to call the office or send a mychart message so the prescription can be sent to the pharmacy.  2 samples (14 day supply) left at [facility] for patient to pick up. Nothing further needed at this time.

## 2020-01-01 ENCOUNTER — Telehealth: Payer: Self-pay | Admitting: Emergency Medicine

## 2020-01-01 NOTE — Telephone Encounter (Signed)
ATC pt, there was no answer and I could not leave a message due to her voicemail being full. Will try back.

## 2020-01-02 DIAGNOSIS — N390 Urinary tract infection, site not specified: Secondary | ICD-10-CM | POA: Insufficient documentation

## 2020-01-02 DIAGNOSIS — E876 Hypokalemia: Secondary | ICD-10-CM | POA: Insufficient documentation

## 2020-01-02 DIAGNOSIS — E871 Hypo-osmolality and hyponatremia: Secondary | ICD-10-CM | POA: Insufficient documentation

## 2020-01-02 NOTE — Telephone Encounter (Signed)
Spoke with pt. She states she has a month worth right now through [facility]. She will call us back when she needs a refill if she likes the Anoro. She will then search for coupons because it is very expensive. Nothing further is needed at this time.

## 2020-01-05 DIAGNOSIS — N12 Tubulo-interstitial nephritis, not specified as acute or chronic: Secondary | ICD-10-CM | POA: Insufficient documentation

## 2020-01-08 ENCOUNTER — Telehealth: Payer: Self-pay | Admitting: Emergency Medicine

## 2020-01-08 NOTE — Telephone Encounter (Signed)
Called and spoke to pt. Pt states she wants to see if she can get back on Bevespi. Pt states she has tried the [employer] and the Anoro and both didn't help her breathing. Pt states the Bevespi works by far the best. Pt states the Anoro has given her thrush but she is taking left over Nystatin from the last time she had thrush. She states it is helping.   Dr. Lamonte Sakai please advise if ok to try to get pt back on Bevespi and seek the pharmacy teams help in trying to do another PA. Thanks.

## 2020-01-08 NOTE — Telephone Encounter (Signed)
Yes let them know that she has failed both Stiolto and Anoro and that the prior authorization request is based on that

## 2020-01-08 NOTE — Telephone Encounter (Signed)
Restarted PA process via [email or web address] with the updated trial/failure of both stiolto and anoro.   Key: [id number] PA has been sent to plan, and a determination is expected within 24 hours.   Will await PA decision.

## 2020-01-09 NOTE — Telephone Encounter (Signed)
PA is still pending as of this morning. 

## 2020-01-10 NOTE — Telephone Encounter (Signed)
Checked Cover My Meds. The PA has been closed. Contacted [facility] at [phone]. Spoke with Mateo Flow. Was advised that the PA was closed due to an error on Cover My Meds. PA has now completed verbally and can take up to 24-72 hours for determination. Will await PA determination.

## 2020-01-12 ENCOUNTER — Telehealth: Payer: Self-pay | Admitting: Adult Health

## 2020-01-12 MED ORDER — BEVESPI AEROSPHERE 9-4.8 MCG/ACT IN AERO: 2.0000 | INHALATION_SPRAY | Freq: Two times a day (BID) | RESPIRATORY_TRACT | 5 refills | Status: DC

## 2020-01-12 NOTE — Telephone Encounter (Signed)
Request refill of Bevespi to go to pharm . 1 month supply only . Not 90 day.   rx sent

## 2020-01-15 NOTE — Telephone Encounter (Signed)
Called to check status of the PA [phone]. Was advised that the medication is covered as long as she gets a 1 month supply at a time  No PA was required  Spoke with the pt and notified of this  Nothing further needed

## 2020-01-15 NOTE — Telephone Encounter (Signed)
Ria Comment, please advise if you have received a faxed determination of this. Thanks.

## 2020-01-15 NOTE — Telephone Encounter (Signed)
I have not see anything on this pt.

## 2020-01-22 ENCOUNTER — Encounter: Payer: Self-pay | Admitting: Primary Care

## 2020-01-22 ENCOUNTER — Ambulatory Visit (INDEPENDENT_AMBULATORY_CARE_PROVIDER_SITE_OTHER): Payer: Commercial Managed Care - PPO | Admitting: Primary Care

## 2020-01-22 ENCOUNTER — Other Ambulatory Visit: Payer: Self-pay

## 2020-01-22 ENCOUNTER — Telehealth: Payer: Self-pay | Admitting: Emergency Medicine

## 2020-01-22 ENCOUNTER — Ambulatory Visit (INDEPENDENT_AMBULATORY_CARE_PROVIDER_SITE_OTHER): Payer: Commercial Managed Care - PPO

## 2020-01-22 DIAGNOSIS — J301 Allergic rhinitis due to pollen: Secondary | ICD-10-CM | POA: Diagnosis not present

## 2020-01-22 DIAGNOSIS — J441 Chronic obstructive pulmonary disease with (acute) exacerbation: Secondary | ICD-10-CM

## 2020-01-22 DIAGNOSIS — K219 Gastro-esophageal reflux disease without esophagitis: Secondary | ICD-10-CM

## 2020-01-22 MED ORDER — PREDNISONE 10 MG PO TABS: ORAL_TABLET | ORAL | 0 refills | Status: DC

## 2020-01-22 NOTE — Telephone Encounter (Signed)
Chest xray ordered for patient at [facility] location. Patient already contacted and advised if she is able to get there tonight to arrive by 7:00 as location closes at 8:00 and if not to be there in the morning.  Patient voiced understanding.

## 2020-01-22 NOTE — Progress Notes (Signed)
Virtual Visit via Telephone Note  I connected with Olivia Mckee on 01/22/20 at  3:30 PM EST by telephone and verified that I am speaking with the correct person using two identifiers.  Location: Patient: Home Provider: Office   I discussed the limitations, risks, security and privacy concerns of performing an evaluation and management service by telephone and the availability of in person appointments. I also discussed with the patient that there may be a patient responsible charge related to this service. The patient expressed understanding and agreed to proceed.   History of Present Illness: 59 year old female, former smoker (25 pack year hx). PMH significant for COPD, allergic rhinitis, GERD, chronic cough. Patient of Dr. Lamonte Sakai, last seen on 10/31/19. Maintained on Bevespi 2 puffs twice daily. Recommend annual follow-up.   01/22/2020 Patient contacted today for acute televisit. Reports rattling in chest with associated chest wall soreness x 4 days. She started not feeling well after her father passed away from [place] on 12-22-22. She was admitted to the hospital February 8-12th, 2021 for sepsis d/t UTI. Covid negative. WBC 13, hypotensive. UA positive for Ecoli. BC negative. Discharge on ciprofloxacin. While she was in the hospital she was given Anoro which caused thrush symptoms. She also has tried stiolto but states that neither have been as effective as Bevespi. Three month supply costing her >1,000 dollars. She paid out of pocket for one month. She resumed Bevespi 3 days ago. She has been using her Proair rescue inhaler a couple times a day over the last month. She has not tried using her nebulizer. Reports increased cough over the last four days. Cough is somewhat productive with cream to white mucus. Associated wheezing and left rib pain, which she thinks is from coughing. No known fever.  She would like to establish with primary care doctor within [facility] network.     Observations/Objective:  -Able to speak in full sentence -No observed shortness of breath, wheezing  -Occasional cough  Assessment and Plan:  COPD exacerbations: - Increased cough x 4 days with associated wheezing/pleuretic pain - Rx prednisone taper (40mg  x 2 days; 30mg  x 2 days; 20mg  x 2 days; 10mg  x 2 days) - Continue Bevespi two puffs twice daily (Tried and failed both Anoro and Stiolto, will try processing PA) - PRN Proair hfa/ albuterol nebulizer every 4-6 hours for breakthrough sob/wheezing - Recommend taking mucinex 600mg  twice daily  - Needs CXR r/o pneumonia    Follow Up Instructions:   - If symptoms do not improve or worsen   I discussed the assessment and treatment plan with the patient. The patient was provided an opportunity to ask questions and all were answered. The patient agreed with the plan and demonstrated an understanding of the instructions.   The patient was advised to call back or seek an in-person evaluation if the symptoms worsen or if the condition fails to improve as anticipated.  I provided 22 minutes of non-face-to-face time during this encounter.   Martyn Ehrich, NP

## 2020-01-22 NOTE — Telephone Encounter (Signed)
Beth, info below sent since the patient was added to your schedule today at 3:30.  Called the patient back and she stated that she went to [facility] last week on Thursday 01/18/20. Patient stated she was short of breath and felt weak.   Patient stated that since her father died from covid she has been having a hard time dealing with it and when she starts thinking about it, she starts to cry and that triggers the coughing. Her husband will make her lay down and use her inhaler. When she does this, she is able to calm down and the coughing stops.  She was tested for covid last week and it was negative. However, she was found to be septic. Was treated and released (per patient). Advised I would call [facility] [phone] FAX # [phone]) to get a copy of the hospital notes.  The patient stated the rattle can be heard on the left side of her chest since Thursday (01/18/20) and has felt sore under the left side of her breast since Saturday. The only OTC used was nyquil.  Patient scheduled for televisit with Derl Barrow, NP today at 3:30.  Will fax release for records.

## 2020-01-22 NOTE — Patient Instructions (Addendum)
Recommendations: - Continue Bevespi two puffs twice daily (will try and send in PA) - Use Proair inhaler 2 puffs every 4-6 hours for breakthrough sob/wheezing - If finding rescue inhaler is not as effective, use albuterol nebulizer every 4-6 hours  - Take mucinex 600mg  twice daily  Rx: - Prednisone taper (40mg  x 2 days; 30mg  x 2 days; 20mg  x 2 days; 10mg  x 2 days)  Orders: - CXR re: COPD exacerbation/bronchitis  Referral: - Primary care with [facility]   Follow-up: - If symptoms do not improve or worsen

## 2020-01-22 NOTE — Telephone Encounter (Signed)
Thank you. See my office note. Sent in prednisone taper. Needs CXR this week, please order to have completed at nearest facility to patient. Also needs PA for bevespi and/or samples. She has tried and failed Anoro and stiolto.

## 2020-01-23 NOTE — Telephone Encounter (Signed)
Pt calling back-- wants to know some names of Internal Medicine Docs and Ped within [facility]-- asked for [facility].   Can be reached at [phone]

## 2020-01-23 NOTE — Telephone Encounter (Signed)
Thank you :)

## 2020-01-23 NOTE — Telephone Encounter (Signed)
I believe we placed a referral order, can you send to [facility]

## 2020-01-23 NOTE — Telephone Encounter (Signed)
Yes there has been a referral placed to [facility]. I called and spoke with the patient and made her aware of this and she was pleased because it is close to her home. Nothing further is needed.

## 2020-01-23 NOTE — Telephone Encounter (Signed)
Beth, Please advise on who you would recommend for the patient.

## 2020-01-24 NOTE — Telephone Encounter (Signed)
Called patient's pharmacy benefit manager- [facility],  Patient has a commercial high deductible plan ($1,600/ individual and $3,200/ family) rep advised that once patient meets her deductible her copays would be $21 for the Brand medications. Plan is not allowed to discuss the amount of her remaining deductible patient. Bevespi copay card will only pay a maximum of $100/fill. There are no grants available that patient would qualify.   [facility]# [id number]  11:26 AM Olivia Mckee, CPhT

## 2020-01-24 NOTE — Telephone Encounter (Signed)
[facility] and was advised insurance covers the [place] leaving a $384 copay. The pt uses a coupon which will decrease the copay to $284. Pt has already picked up this months script. Pt has tried and failed Spiriva HH, Stiolto, Anoro. We do not have any samples at this time.   Pharmacy, do you know of any other way pt can get this medication at a cheaper copay aside from traditional patient assistance through the manufacturer? Will forward to [facility] as FYI.

## 2020-01-24 NOTE — Telephone Encounter (Signed)
Please let patient know above information. Cost is d/t her deductible. Once met it will be 21 dollar. She can get samples occasionally from [place] if we have them

## 2020-01-24 NOTE — Telephone Encounter (Signed)
I would see if our pharmacy team can help. I had recommended processing a PA for medication. She has indeed tried and failed Anoro and stiolto that I know of. Otherwise we could do nebulized LABA and LAMA.

## 2020-01-25 NOTE — Telephone Encounter (Signed)
Spoke with pt and gave her the information from the pharmacy. She believes after her stay in the hospital for the last 5 days, she will meet her deductible when the bill is released. She will call us if she needs anything. Nothing further is needed.

## 2020-01-26 ENCOUNTER — Telehealth: Payer: Self-pay | Admitting: Emergency Medicine

## 2020-01-26 NOTE — Telephone Encounter (Signed)
Called and spoke with pt. Pt is requesting to see if she could have a few more days of the prednisone to see if it will help fully knock out her cough. Pt stated she recently has been out of the hospital and does not want to have to go back. Stated she took 4 tabs tues, 4 wed, 3 thurs, 3 today. Pt still has about 4 days left on prednisone.  I stated to pt to continue taking the rx as she has been doing and when she gets down to the last dose if she is no better to call us and we can see if another Rx can be sent in and she verbalized understanding. Nothing further needed.

## 2020-01-29 ENCOUNTER — Telehealth: Payer: Self-pay | Admitting: Emergency Medicine

## 2020-01-29 DIAGNOSIS — R0683 Snoring: Secondary | ICD-10-CM

## 2020-01-29 MED ORDER — PREDNISONE 10 MG PO TABS: ORAL_TABLET | ORAL | 0 refills | Status: DC

## 2020-01-29 NOTE — Telephone Encounter (Signed)
Pt had virtual visit with Beth 3/1 and was prescribed a pred taper.  Called and spoke with pt who stated she spoke with [facility] Friday 3/5 stating that she still has one pill of pred left but feels like she still needs to have the taper a few more days to fully help get rid of her symptoms as she is still coughing and is congested.  Pt also states that she snores at night and believes she might want to be started on cpap to see if it would help with that. Pt has never had a sleep study performed.  Dr. Lamonte Sakai, please advise if you are okay with [place] sending more prednisone in for pt and also in regards to her snoring if you want [place] to get pt established with one of our sleep doctors for a sleep consult or what you recommend?

## 2020-01-29 NOTE — Telephone Encounter (Signed)
Called and spoke to pt. Informed her of the recs per RB. Rx sent to preferred pharmacy. Pt requesting to have a home sleep test instead of split night. Spoke with Dr. Lamonte Sakai, he states if the pt's HST comes back positive she will still need an in-lab titration study opposed to doing the split night and getting a baseline study and a titration with one study. Informed pt of this information, she states she would rather do the split night. Order placed. OV scheduled with Dr. Lamonte Sakai for 4/9. Pt verbalized understanding and denied any further questions or concerns at this time.   PCCs please advise on sleep study, can this be done before pt's visit with RB on 4/9.

## 2020-01-29 NOTE — Telephone Encounter (Signed)
1 - ok with pred taper >> Take 40mg  daily for 3 days, then 30mg  daily for 3 days, then 20mg  daily for 3 days, then 10mg  daily for 3 days, then stop  2- If she agrees, order a split night sleep study for her, then an OV with me a month after to review

## 2020-01-30 NOTE — Telephone Encounter (Signed)
Split night sched for 3/30 & covid for 3/27 @ 10

## 2020-02-05 ENCOUNTER — Ambulatory Visit: Payer: Commercial Managed Care - PPO | Admitting: Emergency Medicine

## 2020-02-06 ENCOUNTER — Telehealth: Payer: Self-pay | Admitting: Emergency Medicine

## 2020-02-06 ENCOUNTER — Other Ambulatory Visit: Payer: Self-pay | Admitting: *Deleted

## 2020-02-06 MED ORDER — BEVESPI AEROSPHERE 9-4.8 MCG/ACT IN AERO: 2.0000 | INHALATION_SPRAY | Freq: Two times a day (BID) | RESPIRATORY_TRACT | 1 refills | Status: DC

## 2020-02-06 NOTE — Telephone Encounter (Signed)
Spoke with pt and advised rx for Bevespi 90 day supply was sent to pharmacy. Nothing further is needed.

## 2020-02-08 ENCOUNTER — Telehealth: Payer: Self-pay | Admitting: Emergency Medicine

## 2020-02-08 NOTE — Telephone Encounter (Signed)
Spoke with pt. States that she is doing well on Bevespi and wanted to let us know. Nothing further was needed.

## 2020-02-17 ENCOUNTER — Other Ambulatory Visit (HOSPITAL_COMMUNITY): Payer: Commercial Managed Care - PPO

## 2020-02-20 ENCOUNTER — Encounter (HOSPITAL_BASED_OUTPATIENT_CLINIC_OR_DEPARTMENT_OTHER): Payer: Commercial Managed Care - PPO | Admitting: Pulmonary Disease

## 2020-03-01 ENCOUNTER — Ambulatory Visit: Payer: Commercial Managed Care - PPO | Admitting: Emergency Medicine

## 2020-03-06 ENCOUNTER — Other Ambulatory Visit: Payer: Self-pay

## 2020-03-07 ENCOUNTER — Encounter: Payer: Self-pay | Admitting: Family Medicine

## 2020-03-07 ENCOUNTER — Other Ambulatory Visit: Payer: Self-pay

## 2020-03-07 ENCOUNTER — Ambulatory Visit (INDEPENDENT_AMBULATORY_CARE_PROVIDER_SITE_OTHER): Payer: Commercial Managed Care - PPO | Admitting: Family Medicine

## 2020-03-07 ENCOUNTER — Telehealth: Payer: Self-pay

## 2020-03-07 VITALS — BP 100/64 | HR 85 | Temp 98.2°F | Resp 16 | Ht 63.0 in | Wt 236.6 lb

## 2020-03-07 DIAGNOSIS — G8929 Other chronic pain: Secondary | ICD-10-CM | POA: Diagnosis not present

## 2020-03-07 DIAGNOSIS — E7800 Pure hypercholesterolemia, unspecified: Secondary | ICD-10-CM

## 2020-03-07 DIAGNOSIS — Z8659 Personal history of other mental and behavioral disorders: Secondary | ICD-10-CM

## 2020-03-07 DIAGNOSIS — R070 Pain in throat: Secondary | ICD-10-CM | POA: Diagnosis not present

## 2020-03-07 DIAGNOSIS — F419 Anxiety disorder, unspecified: Secondary | ICD-10-CM | POA: Diagnosis not present

## 2020-03-07 MED ORDER — MAGIC MOUTHWASH W/LIDOCAINE: ORAL | 1 refills | Status: DC

## 2020-03-07 NOTE — Telephone Encounter (Signed)
Made in error

## 2020-03-07 NOTE — Progress Notes (Signed)
Office Note 03/10/2020  CC:  Chief Complaint  Patient presents with  . Establish Care    Previous PCP, Olivia Mckee    HPI:  Olivia Mckee is a 59 y.o. adult who is here to establish care Patient's most recent primary MD: Olivia Mckee ([facility] in [place]). Old records in EPIC/HL EMR were reviewed prior to or during today's visit.  Takes xanax tid for anxiety.  Was on wellbutrin but pt took herself off this. She was on fluox 40mg  bid but she has decreased this to 40mg  qd.  Dr. Alfonso Mckee has been rx'ing her pain meds (bilat shoulder pain and feet pain)--meloxicam and tramadol. PMP AWARE reviewed today: most recent rx for alprazolam 0.5mg  was filled 02/14/20, #90, rx by Olivia Mckee. Tramadol filled 02/05/20, #90, rx by Olivia Mckee. Ambien last filled 01/13/20, #90, rx by Olivia Mckee.  Hx of alcoholism, dry since 2018.  Obesity: rx'd contrave by bariatric MD, 1 bid but this has not helped for her wt loss so she plans on stopping it. She has routine f/u with Olivia Mckee but cancelled her upcoming appt.  Her father recently died of covid 5.   Past Medical History:  Diagnosis Date  . Alcoholism ([place])   . Allergic rhinitis   . Anxiety   . Cervical cancer ([place])   . COPD (chronic obstructive pulmonary disease) ([place])   . GERD (gastroesophageal reflux disease)   . History of pyelonephritis 12/2019  . Hypercholesterolemia   . Insomnia   . MDD (major depressive disorder)   . Perforated bowel ([place])    perf'd SBO; no colon removed.  . Ventral hernia     Past Surgical History:  Procedure Laterality Date  . BREAST BIOPSY  2010   benign cyst  . CHOLECYSTECTOMY    . COLOSTOMY    . perfor colon repair     SBO after gallbladder surgery  . TONSILLECTOMY    . TOTAL ABDOMINAL HYSTERECTOMY W/ BILATERAL SALPINGOOPHORECTOMY  1984   cervical precancer. Ovaries were fine but pt told surgeon to take them out.  . VENTRAL HERNIA REPAIR  2016    Family  History  Problem Relation Age of Onset  . Diabetes Mother   . Hypertension Mother   . Hearing loss Mother   . Depression Mother   . Diabetes Father   . Hearing loss Father   . Early death Father   . Stroke Maternal Grandmother   . Kidney disease Maternal Grandmother   . Diabetes Maternal Grandmother   . Heart disease Maternal Grandmother   . Arthritis Maternal Grandmother   . Stroke Maternal Grandfather   . Prostate cancer Maternal Grandfather   . Diabetes Maternal Grandfather   . Heart disease Maternal Grandfather   . Arthritis Maternal Grandfather   . Alcohol abuse Maternal Grandfather   . Depression Maternal Grandfather   . Diabetes Paternal Grandmother   . Alcohol abuse Paternal Grandmother   . Diabetes Paternal Grandfather   . Alcohol abuse Paternal Grandfather   . Heart disease Paternal Grandfather     Social History   Socioeconomic History  . Marital status: Married    Spouse name: Not on file  . Number of children: Not on file  . Years of education: Not on file  . Highest education level: Not on file  Occupational History  . Not on file  Tobacco Use  . Smoking status: Former Smoker    Packs/day: 1.00    Years: 25.00    Pack years:  25.00    Types: Cigarettes    Quit date: 11/23/2010    Years since quitting: 9.3  . Smokeless tobacco: Never Used  Substance and Sexual Activity  . Alcohol use: No  . Drug use: No  . Sexual activity: Not on file  Other Topics Concern  . Not on file  Social History Narrative   Married, 1 daughter, several grandkids (2 of whom pt is raising).   Educ: some college   Occup: retired Development worker, international aid.   Tob: 20 pack-yr hx, quit approx 2005.   Alc: hx of alcoholism, quit 2018.   No hx of drug abuse.      Social Determinants of Health   Financial Resource Strain:   . Difficulty of Paying Living Expenses:   Food Insecurity:   . Worried About Charity fundraiser in the Last Year:   . Arboriculturist in the Last Year:    Transportation Needs:   . Film/video editor (Medical):   Marland Kitchen Lack of Transportation (Non-Medical):   Physical Activity:   . Days of Exercise per Week:   . Minutes of Exercise per Session:   Stress:   . Feeling of Stress :   Social Connections:   . Frequency of Communication with Friends and Family:   . Frequency of Social Gatherings with Friends and Family:   . Attends Religious Services:   . Active Member of Clubs or Organizations:   . Attends Archivist Meetings:   Marland Kitchen Marital Status:   Intimate Partner Violence:   . Fear of Current or Ex-Partner:   . Emotionally Abused:   Marland Kitchen Physically Abused:   . Sexually Abused:     Outpatient Encounter Medications as of 03/07/2020  Medication Sig  . ALPRAZolam (XANAX) 0.5 MG tablet Take 0.5 mg by mouth 3 (three) times daily as needed.  . benzonatate (TESSALON) 200 MG capsule Take 1 capsule (200 mg total) by mouth 3 (three) times daily as needed for cough.  . cyclobenzaprine (FLEXERIL) 10 MG tablet Take 10 mg by mouth 2 (two) times daily as needed.  Marland Kitchen FLUoxetine (PROZAC) 40 MG capsule   . Glycopyrrolate-Formoterol (BEVESPI AEROSPHERE) 9-4.8 MCG/ACT AERO Inhale 2 puffs into the lungs 2 (two) times daily.  . meloxicam (MOBIC) 15 MG tablet Take 15 mg by mouth daily.  . Naltrexone-buPROPion HCl ER (CONTRAVE) 8-90 MG TB12 Take 2 tablets by mouth 2 (two) times daily.  . pantoprazole (PROTONIX) 40 MG tablet TAKE 1 TABLET BY MOUTH TWICE A DAY  . pravastatin (PRAVACHOL) 10 MG tablet Take 10 mg by mouth daily.  . valACYclovir (VALTREX) 500 MG tablet TAKE 1 TABLET BY MOUTH THEN TAKE 1 TABLET 12 HOURS LATER  . zolpidem (AMBIEN) 10 MG tablet Take 10 mg by mouth at bedtime as needed.  . fluconazole (DIFLUCAN) 200 MG tablet Take 200 mg by mouth daily.  . magic mouthwash w/lidocaine SOLN 5 ml po swish and swallow qid prn  . traMADol (ULTRAM) 50 MG tablet Take 50 mg by mouth 4 (four) times daily as needed.  . [DISCONTINUED] magic mouthwash  w/lidocaine SOLN 5 ml po swish and swallow qid prn  . [DISCONTINUED] predniSONE (DELTASONE) 10 MG tablet Take 4 tabs po daily x 2 days; then 3 tabs for 2 days; then 2 tabs for 2 days; then 1 tab for 2 days (Patient not taking: Reported on 03/07/2020)  . [DISCONTINUED] predniSONE (DELTASONE) 10 MG tablet Take 40mg  for 3 days, then 30mg  for 3 days, 20mg  for  3 days, 10mg  for 3 days, then stop (Patient not taking: Reported on 03/07/2020)  . [DISCONTINUED] Tiotropium Bromide-Olodaterol (STIOLTO RESPIMAT) 2.5-2.5 MCG/ACT AERS Inhale into the lungs.   No facility-administered encounter medications on file as of 03/07/2020.    Allergies  Allergen Reactions  . Penicillins Anaphylaxis and Rash  . Sulfa Antibiotics Anaphylaxis and Rash    ROS Review of Systems  Constitutional: Negative for fatigue and fever.  HENT: Positive for sore throat. Negative for congestion, dental problem, ear pain, postnasal drip, sinus pressure, sneezing and voice change.   Eyes: Negative for discharge, redness, itching and visual disturbance.  Respiratory: Negative for cough.   Cardiovascular: Negative for chest pain.  Gastrointestinal: Negative for abdominal pain and nausea.  Genitourinary: Negative for dysuria.  Musculoskeletal: Negative for back pain and joint swelling.  Skin: Negative for rash.  Neurological: Negative for weakness and headaches.  Hematological: Negative for adenopathy.    PE; Blood pressure 100/64, pulse 85, temperature 98.2 F (36.8 C), temperature source Temporal, resp. rate 16, height 5\' 3"  (1.6 m), weight 236 lb 9.6 oz (107.3 kg), SpO2 96 %. Body mass index is 41.91 kg/m.  Gen: Alert, well appearing.  Patient is oriented to person, place, time, and situation. AFFECT: pleasant, lucid thought and speech. ENT: Ears: EACs clear, normal epithelium.  TMs with good light reflex and landmarks bilaterally.  Eyes: no injection, icteris, swelling, or exudate.  EOMI, PERRLA. Nose: no drainage or  turbinate edema/swelling.  No injection or focal lesion.  Mouth: lips without lesion/swelling.  Oral mucosa pink and moist.  Dentition intact and without obvious caries or gingival swelling.  Oropharynx without erythema, exudate, or swelling.  I cannot feel any enlargement of submandibular or parotid gland.  She has mild TTP focally inferior to angle of L mandible.  No erythema of skin.  No teeth tenderness. CV: RRR, no m/r/g.   LUNGS: CTA bilat, nonlabored resps, good aeration in all lung fields. EXT: no clubbing or cyanosis.  no edema.   Pertinent labs:  NONE  ASSESSMENT AND PLAN:   1) L side of throat pain: exam normal. ? Abrasion of mucosa.  Magic mouthwash with lidocaine 46ml qid prn swish and swallow, #100 ml, RF x 1.  2) Chronic anxiety/depression: currently stable on fluoxetine 40mg  qd and xanax 0.5mg  tid.  This was last filled 02/14/20 for 1 mo supply, so the earliest I would assume rx this is 03/15/20.  3) HLD: tolerating statin.  4) COPD: stable on stiolto respimat, managed by pulm MD.  5) Obesity: pt reports going to bariatric/wt loss clinic, rx'd contrave but plans on no longer taking it and plans on stopping f/u with the clinic.  No new med or rec's regarding this problem today.  6) Chronic pain: shoulders and feet.  We did not discuss this today other than to establish that her meloxicam and tramadol for her pain in feet and shoulders and feet are managed by Dr. Alfonso Mckee.  7) Hist of alcoholism: no alc for about 3 yrs now.  No refills of any of her chronic meds were given/needed today. Plan on fasting HP labs at f/u for CPE in 3-4 wks. She says she may be calling before her cpe in 3-4 wks to request RF of her alprazolam.  Last colonoscopy->sometime around 2016, hx of polyps.  She is not sure where procedure was done but says "[facility]".  An After Visit Summary was printed and given to the patient.  Return for 3-4 wks fasting CPE.  Signed:  Crissie Sickles, MD            03/10/2020

## 2020-03-07 NOTE — Telephone Encounter (Signed)
Pharmacy called in needing a better understanding of Rx sent to pharmacy    magic mouthwash w/lidocaine SOLN   Please advise

## 2020-03-07 NOTE — Telephone Encounter (Signed)
Can you request records from this office? [facility]. Their fax number is [phone]

## 2020-03-07 NOTE — Telephone Encounter (Signed)
patient called in wanting to give Dr name of surgeon Dr. Ermalinda Memos [phone] through [place]

## 2020-03-07 NOTE — Telephone Encounter (Signed)
Please clarify sig and directions for magic mouthwash sent in

## 2020-03-08 NOTE — Telephone Encounter (Signed)
Patient called in today stating that the pharmacy is saying they can't fill Rx because they need to mix the mouth wash and don't have all the instructions on the Rx. Wanting to know if someone could possible help patient out because she is in pain. Did explain to patient that Dr is out of office today and wouldn't return until Monday.    Please advise

## 2020-03-08 NOTE — Telephone Encounter (Signed)
Contacted pharmacy and they state they need a ratio / recipe to fill RX.  Please advise.

## 2020-03-08 NOTE — Telephone Encounter (Signed)
Faxed medical record request for continuity of care form to [facility] [phone]

## 2020-03-08 NOTE — Telephone Encounter (Signed)
Recipe given to pharmacist at [facility] per Dr Charlett Blake.    Diphenhydramine (12.53ml/5ml) 105 ml Lido 2% 120 ml Nystatin (500,000 units/64ml) 15 ml   Dispensed quantity was changed to fit recipe above.   Patient advised RX was called into pharmacy.

## 2020-03-09 NOTE — Telephone Encounter (Signed)
Noted  

## 2020-03-10 ENCOUNTER — Encounter: Payer: Self-pay | Admitting: Family Medicine

## 2020-03-15 ENCOUNTER — Other Ambulatory Visit: Payer: Self-pay

## 2020-03-15 MED ORDER — ALPRAZOLAM 0.5 MG PO TABS: 0.5000 mg | ORAL_TABLET | Freq: Three times a day (TID) | ORAL | 5 refills | Status: DC | PRN

## 2020-03-15 NOTE — Telephone Encounter (Signed)
Refill sent.

## 2020-03-15 NOTE — Telephone Encounter (Signed)
Patient refill request  ALPRAZolam Duanne Moron) 0.5 MG tablet [id number]    [facility] [address] [facility]

## 2020-03-15 NOTE — Telephone Encounter (Signed)
Requesting: alprazolam Contract:n/a UDS:n/a Last Visit:03/07/20 Next Visit:advised to f/u 3-4 weeks for CPE Last Refill: n/a  Please Advise. Medication pending

## 2020-03-28 ENCOUNTER — Ambulatory Visit (INDEPENDENT_AMBULATORY_CARE_PROVIDER_SITE_OTHER): Payer: Commercial Managed Care - PPO | Admitting: Family Medicine

## 2020-03-28 ENCOUNTER — Encounter: Payer: Self-pay | Admitting: Family Medicine

## 2020-03-28 ENCOUNTER — Other Ambulatory Visit: Payer: Self-pay

## 2020-03-28 ENCOUNTER — Ambulatory Visit: Payer: Commercial Managed Care - PPO | Admitting: Family Medicine

## 2020-03-28 VITALS — BP 110/73 | HR 82 | Temp 97.9°F | Resp 18 | Ht 64.5 in | Wt 233.0 lb

## 2020-03-28 DIAGNOSIS — Z23 Encounter for immunization: Secondary | ICD-10-CM | POA: Diagnosis not present

## 2020-03-28 DIAGNOSIS — E7800 Pure hypercholesterolemia, unspecified: Secondary | ICD-10-CM

## 2020-03-28 DIAGNOSIS — Z0000 Encounter for general adult medical examination without abnormal findings: Secondary | ICD-10-CM | POA: Diagnosis not present

## 2020-03-28 DIAGNOSIS — Z114 Encounter for screening for human immunodeficiency virus [HIV]: Secondary | ICD-10-CM | POA: Diagnosis not present

## 2020-03-28 DIAGNOSIS — Z1159 Encounter for screening for other viral diseases: Secondary | ICD-10-CM

## 2020-03-28 DIAGNOSIS — Z1231 Encounter for screening mammogram for malignant neoplasm of breast: Secondary | ICD-10-CM | POA: Diagnosis not present

## 2020-03-28 LAB — LDL CHOLESTEROL, DIRECT: Direct LDL: 91 mg/dL

## 2020-03-28 LAB — CBC WITH DIFFERENTIAL/PLATELET
Basophils Absolute: 0.1 10*3/uL (ref 0.0–0.1)
Basophils Relative: 1.3 % (ref 0.0–3.0)
Eosinophils Absolute: 0.5 10*3/uL (ref 0.0–0.7)
Eosinophils Relative: 6.1 % — ABNORMAL HIGH (ref 0.0–5.0)
HCT: 38.7 % (ref 36.0–46.0)
Hemoglobin: 13.1 g/dL (ref 12.0–15.0)
Lymphocytes Relative: 25.2 % (ref 12.0–46.0)
Lymphs Abs: 2.1 10*3/uL (ref 0.7–4.0)
MCHC: 33.8 g/dL (ref 30.0–36.0)
MCV: 87.9 fl (ref 78.0–100.0)
Monocytes Absolute: 0.6 10*3/uL (ref 0.1–1.0)
Monocytes Relative: 7 % (ref 3.0–12.0)
Neutro Abs: 4.9 10*3/uL (ref 1.4–7.7)
Neutrophils Relative %: 60.4 % (ref 43.0–77.0)
Platelets: 333 10*3/uL (ref 150.0–400.0)
RBC: 4.41 Mil/uL (ref 3.87–5.11)
RDW: 15.8 % — ABNORMAL HIGH (ref 11.5–15.5)
WBC: 8.2 10*3/uL (ref 4.0–10.5)

## 2020-03-28 LAB — LIPID PANEL
Cholesterol: 179 mg/dL (ref 0–200)
HDL: 55 mg/dL (ref >39.00)
NonHDL: 124.49
Total CHOL/HDL Ratio: 3
Triglycerides: 212 mg/dL — ABNORMAL HIGH (ref 0.0–149.0)
VLDL: 42.4 mg/dL — ABNORMAL HIGH (ref 0.0–40.0)

## 2020-03-28 LAB — COMPREHENSIVE METABOLIC PANEL
ALT: 20 U/L (ref 0–35)
AST: 18 U/L (ref 0–37)
Albumin: 3.9 g/dL (ref 3.5–5.2)
Alkaline Phosphatase: 88 U/L (ref 39–117)
BUN: 15 mg/dL (ref 6–23)
CO2: 26 mEq/L (ref 19–32)
Calcium: 9.5 mg/dL (ref 8.4–10.5)
Chloride: 104 mEq/L (ref 96–112)
Creatinine, Ser: 0.87 mg/dL (ref 0.40–1.20)
GFR: 66.58 mL/min (ref >60.00)
Glucose, Bld: 96 mg/dL (ref 70–99)
Potassium: 4.3 mEq/L (ref 3.5–5.1)
Sodium: 137 mEq/L (ref 135–145)
Total Bilirubin: 0.2 mg/dL (ref 0.2–1.2)
Total Protein: 6.9 g/dL (ref 6.0–8.3)

## 2020-03-28 LAB — TSH: TSH: 1.71 u[IU]/mL (ref 0.35–4.50)

## 2020-03-28 MED ORDER — ZOLPIDEM TARTRATE 10 MG PO TABS: 10.0000 mg | ORAL_TABLET | Freq: Every evening | ORAL | 1 refills | Status: DC | PRN

## 2020-03-28 NOTE — Telephone Encounter (Signed)
Pt was seen 03/28/2020 for CPE. Please advise if Ambien can be refilled.   Thanks

## 2020-03-28 NOTE — Telephone Encounter (Signed)
OK ambien eRx'd

## 2020-03-28 NOTE — Addendum Note (Signed)
Addended by: Tammi Sou on: 03/28/2020 12:11 PM   Modules accepted: Orders

## 2020-03-28 NOTE — Progress Notes (Signed)
Office Note 03/28/2020  CC:  Chief Complaint  Patient presents with  . Annual Exam    "Doing well"      Pt is fasting  HPI:  Olivia Mckee is a 59 y.o. White adult who is here for annual health maintenance exam and f/u of recent soreness in throat. Magic mouthwash rx'd last visit for ? Throat abrasion---exam was normal.  Her ST is completely gone, uses magic mouthwash a few times.  No acute complaints today. She is starting to walk around her property some for exercise. She works pretty hard on diet b/c wants to lose wt.     Past Medical History:  Diagnosis Date  . Alcoholism ([place])   . Allergic rhinitis   . Anxiety   . Cervical cancer ([place])   . COPD (chronic obstructive pulmonary disease) ([place])   . GERD (gastroesophageal reflux disease)   . History of pyelonephritis 12/2019  . Hypercholesterolemia   . Insomnia   . MDD (major depressive disorder)   . Perforated bowel ([place])    perf'd SBO; no colon removed.    Past Surgical History:  Procedure Laterality Date  . BREAST BIOPSY  2010   benign cyst  . CHOLECYSTECTOMY    . COLONOSCOPY  2016 approx ??   pt not sure, getting more info as of 02/2020  . COLOSTOMY     + colostomy takedown  . perfor colon repair     SBO after gallbladder surgery  . TONSILLECTOMY    . TOTAL ABDOMINAL HYSTERECTOMY W/ BILATERAL SALPINGOOPHORECTOMY  1984   cervical precancer. Ovaries were fine but pt told surgeon to take them out.  . VENTRAL HERNIA REPAIR  2016    Family History  Problem Relation Age of Onset  . Diabetes Mother   . Hypertension Mother   . Hearing loss Mother   . Depression Mother   . Diabetes Father   . Hearing loss Father   . Early death Father   . Stroke Maternal Grandmother   . Kidney disease Maternal Grandmother   . Diabetes Maternal Grandmother   . Heart disease Maternal Grandmother   . Arthritis Maternal Grandmother   . Stroke Maternal Grandfather   . Prostate cancer Maternal Grandfather   . Diabetes  Maternal Grandfather   . Heart disease Maternal Grandfather   . Arthritis Maternal Grandfather   . Alcohol abuse Maternal Grandfather   . Depression Maternal Grandfather   . Diabetes Paternal Grandmother   . Alcohol abuse Paternal Grandmother   . Diabetes Paternal Grandfather   . Alcohol abuse Paternal Grandfather   . Heart disease Paternal Grandfather     Social History   Socioeconomic History  . Marital status: Married    Spouse name: Not on file  . Number of children: Not on file  . Years of education: Not on file  . Highest education level: Not on file  Occupational History  . Not on file  Tobacco Use  . Smoking status: Former Smoker    Packs/day: 1.00    Years: 25.00    Pack years: 25.00    Types: Cigarettes    Quit date: 11/23/2010    Years since quitting: 9.3  . Smokeless tobacco: Never Used  Substance and Sexual Activity  . Alcohol use: No  . Drug use: No  . Sexual activity: Not on file  Other Topics Concern  . Not on file  Social History Narrative   Married, 1 daughter, several grandkids (2 of whom pt is raising).  Educ: some college   Occup: retired Development worker, [employer].   Tob: 20 pack-yr hx, quit approx 2005.   Alc: hx of alcoholism, quit 2018.   No hx of drug abuse.      Social Determinants of Health   Financial Resource Strain:   . Difficulty of Paying Living Expenses:   Food Insecurity:   . Worried About Charity fundraiser in the Last Year:   . Arboriculturist in the Last Year:   Transportation Needs:   . Film/video editor (Medical):   Marland Kitchen Lack of Transportation (Non-Medical):   Physical Activity:   . Days of Exercise per Week:   . Minutes of Exercise per Session:   Stress:   . Feeling of Stress :   Social Connections:   . Frequency of Communication with Friends and Family:   . Frequency of Social Gatherings with Friends and Family:   . Attends Religious Services:   . Active Member of Clubs or Organizations:   . Attends Archivist  Meetings:   Marland Kitchen Marital Status:   Intimate Partner Violence:   . Fear of Current or Ex-Partner:   . Emotionally Abused:   Marland Kitchen Physically Abused:   . Sexually Abused:     Outpatient Medications Prior to Visit  Medication Sig Dispense Refill  . ALPRAZolam (XANAX) 0.5 MG tablet Take 1 tablet (0.5 mg total) by mouth 3 (three) times daily as needed. 90 tablet 5  . benzonatate (TESSALON) 200 MG capsule Take 1 capsule (200 mg total) by mouth 3 (three) times daily as needed for cough. 30 capsule 0  . cyclobenzaprine (FLEXERIL) 10 MG tablet Take 10 mg by mouth 2 (two) times daily as needed.    . fluconazole (DIFLUCAN) 200 MG tablet Take 200 mg by mouth daily.    Marland Kitchen FLUoxetine (PROZAC) 40 MG capsule     . Glycopyrrolate-Formoterol (BEVESPI AEROSPHERE) 9-4.8 MCG/ACT AERO Inhale 2 puffs into the lungs 2 (two) times daily. 32.1 g 1  . magic mouthwash w/lidocaine SOLN 5 ml po swish and swallow qid prn 100 mL 1  . meloxicam (MOBIC) 15 MG tablet Take 15 mg by mouth daily.    . pantoprazole (PROTONIX) 40 MG tablet TAKE 1 TABLET BY MOUTH TWICE A DAY 180 tablet 0  . pravastatin (PRAVACHOL) 10 MG tablet Take 10 mg by mouth daily.    . traMADol (ULTRAM) 50 MG tablet Take 50 mg by mouth 4 (four) times daily as needed.    . valACYclovir (VALTREX) 500 MG tablet TAKE 1 TABLET BY MOUTH THEN TAKE 1 TABLET 12 HOURS LATER    . zolpidem (AMBIEN) 10 MG tablet Take 10 mg by mouth at bedtime as needed.    . Naltrexone-buPROPion HCl ER (CONTRAVE) 8-90 MG TB12 Take 2 tablets by mouth 2 (two) times daily.     No facility-administered medications prior to visit.    Allergies  Allergen Reactions  . Penicillins Anaphylaxis and Rash  . Sulfa Antibiotics Anaphylaxis and Rash    ROS Review of Systems  Constitutional: Negative for appetite change, chills, fatigue and fever.  HENT: Negative for congestion, dental problem, ear pain and sore throat.   Eyes: Negative for discharge, redness and visual disturbance.  Respiratory:  Negative for cough, chest tightness, shortness of breath and wheezing.   Cardiovascular: Negative for chest pain, palpitations and leg swelling.  Gastrointestinal: Negative for abdominal pain, blood in stool, diarrhea, nausea and vomiting.  Genitourinary: Negative for difficulty urinating, dysuria, flank pain, frequency,  hematuria and urgency.  Musculoskeletal: Negative for arthralgias, back pain, joint swelling, myalgias and neck stiffness.  Skin: Negative for pallor and rash.  Neurological: Negative for dizziness, speech difficulty, weakness and headaches.  Hematological: Negative for adenopathy. Does not bruise/bleed easily.  Psychiatric/Behavioral: Negative for confusion and sleep disturbance. The patient is not nervous/anxious.     PE; Vitals with BMI 03/28/2020 03/07/2020 10/31/2019  Height 5' 4.5" 5\' 3"  5\' 3"   Weight 233 lbs 236 lbs 10 oz 230 lbs  BMI 39.39 [id number]  Systolic [id number]  Diastolic 73 64 74  Pulse 82 85 101   Exam chaperoned by CMA today. Gen: Alert, well appearing.  Patient is oriented to person, place, time, and situation. AFFECT: pleasant, lucid thought and speech. ENT: Ears: EACs clear, normal epithelium.  TMs with good light reflex and landmarks bilaterally.  Eyes: no injection, icteris, swelling, or exudate.  EOMI, PERRLA. Nose: no drainage or turbinate edema/swelling.  No injection or focal lesion.  Mouth: lips without lesion/swelling.  Oral mucosa pink and moist.  Dentition intact and without obvious caries or gingival swelling.  Oropharynx without erythema, exudate, or swelling.  Neck: supple/nontender.  No LAD, mass, or TM.  Carotid pulses 2+ bilaterally, without bruits. CV: RRR, no m/r/g.   LUNGS: CTA bilat, nonlabored resps, good aeration in all lung fields. ABD: soft, NT, ND, BS normal.  No hepatospenomegaly or mass.  No bruits. EXT: no clubbing, cyanosis, or edema.  Musculoskeletal: no joint swelling, erythema, warmth, or tenderness.  ROM of all  joints intact. Skin - no sores or suspicious lesions or rashes or color changes  Pertinent labs:  none  ASSESSMENT AND PLAN:   Health maintenance exam: Reviewed age and gender appropriate health maintenance issues (prudent diet, regular exercise, health risks of tobacco and excessive alcohol, use of seatbelts, fire alarms in home, use of sunscreen).  Also reviewed age and gender appropriate health screening as well as vaccine recommendations. Vaccines: Pneumovax 23 (COPD)--given today.  Shingrix discussed-->she has had this approx 1 yr ago ([facility]  Tdap is UTD.  She has had 1 of 2 covid 19 shots. Labs: fasting HP labs, she declined hep c and hiv screening today. Cervical ca screening: pt is s/p TAH many years ago ("precervical ca").  States she did get appropriate f/u pap/pelvics for a few years after and all were normal.  Has not followed up in years.  No further pap/pelvic exams indicated. Breast ca screening: she had a mammogram approx 01/2020 ([facility])--normal per pt. Colon ca screening: last colonoscopy per pt approx 2016--"polyps" : will ask for records from [facility] specialists.  An After Visit Summary was printed and given to the patient.  FOLLOW UP:  No follow-ups on file.  Signed:  Crissie Sickles, MD           03/28/2020

## 2020-03-28 NOTE — Addendum Note (Signed)
Addended by: Jiles Prows on: 03/28/2020 09:13 AM   Modules accepted: Orders

## 2020-03-28 NOTE — Patient Instructions (Signed)
Health Maintenance, Female Adopting a healthy lifestyle and getting preventive care are important in promoting health and wellness. Ask your health care provider about:  The right schedule for you to have regular tests and exams.  Things you can do on your own to prevent diseases and keep yourself healthy. What should I know about diet, weight, and exercise? Eat a healthy diet   Eat a diet that includes plenty of vegetables, fruits, low-fat dairy products, and lean protein.  Do not eat a lot of foods that are high in solid fats, added sugars, or sodium. Maintain a healthy weight Body mass index (BMI) is used to identify weight problems. It estimates body fat based on height and weight. Your health care provider can help determine your BMI and help you achieve or maintain a healthy weight. Get regular exercise Get regular exercise. This is one of the most important things you can do for your health. Most adults should:  Exercise for at least 150 minutes each week. The exercise should increase your heart rate and make you sweat (moderate-intensity exercise).  Do strengthening exercises at least twice a week. This is in addition to the moderate-intensity exercise.  Spend less time sitting. Even light physical activity can be beneficial. Watch cholesterol and blood lipids Have your blood tested for lipids and cholesterol at 59 years of age, then have this test every 5 years. Have your cholesterol levels checked more often if:  Your lipid or cholesterol levels are high.  You are older than 59 years of age.  You are at high risk for heart disease. What should I know about cancer screening? Depending on your health history and family history, you may need to have cancer screening at various ages. This may include screening for:  Breast cancer.  Cervical cancer.  Colorectal cancer.  Skin cancer.  Lung cancer. What should I know about heart disease, diabetes, and high blood  pressure? Blood pressure and heart disease  High blood pressure causes heart disease and increases the risk of stroke. This is more likely to develop in people who have high blood pressure readings, are of African descent, or are overweight.  Have your blood pressure checked: ? Every 3-5 years if you are 18-39 years of age. ? Every year if you are 40 years old or older. Diabetes Have regular diabetes screenings. This checks your fasting blood sugar level. Have the screening done:  Once every three years after age 40 if you are at a normal weight and have a low risk for diabetes.  More often and at a younger age if you are overweight or have a high risk for diabetes. What should I know about preventing infection? Hepatitis B If you have a higher risk for hepatitis B, you should be screened for this virus. Talk with your health care provider to find out if you are at risk for hepatitis B infection. Hepatitis C Testing is recommended for:  Everyone born from 1945 through 1965.  Anyone with known risk factors for hepatitis C. Sexually transmitted infections (STIs)  Get screened for STIs, including gonorrhea and chlamydia, if: ? You are sexually active and are younger than 59 years of age. ? You are older than 59 years of age and your health care provider tells you that you are at risk for this type of infection. ? Your sexual activity has changed since you were last screened, and you are at increased risk for chlamydia or gonorrhea. Ask your health care provider if   you are at risk.  Ask your health care provider about whether you are at high risk for HIV. Your health care provider may recommend a prescription medicine to help prevent HIV infection. If you choose to take medicine to prevent HIV, you should first get tested for HIV. You should then be tested every 3 months for as long as you are taking the medicine. Pregnancy  If you are about to stop having your period (premenopausal) and  you may become pregnant, seek counseling before you get pregnant.  Take 400 to 800 micrograms (mcg) of folic acid every day if you become pregnant.  Ask for birth control (contraception) if you want to prevent pregnancy. Osteoporosis and menopause Osteoporosis is a disease in which the bones lose minerals and strength with aging. This can result in bone fractures. If you are 65 years old or older, or if you are at risk for osteoporosis and fractures, ask your health care provider if you should:  Be screened for bone loss.  Take a calcium or vitamin D supplement to lower your risk of fractures.  Be given hormone replacement therapy (HRT) to treat symptoms of menopause. Follow these instructions at home: Lifestyle  Do not use any products that contain nicotine or tobacco, such as cigarettes, e-cigarettes, and chewing tobacco. If you need help quitting, ask your health care provider.  Do not use street drugs.  Do not share needles.  Ask your health care provider for help if you need support or information about quitting drugs. Alcohol use  Do not drink alcohol if: ? Your health care provider tells you not to drink. ? You are pregnant, may be pregnant, or are planning to become pregnant.  If you drink alcohol: ? Limit how much you use to 0-1 drink a day. ? Limit intake if you are breastfeeding.  Be aware of how much alcohol is in your drink. In the [place] one drink equals one 12 oz bottle of beer (355 mL), one 5 oz glass of wine (148 mL), or one 1 oz glass of hard liquor (44 mL). General instructions  Schedule regular health, dental, and eye exams.  Stay current with your vaccines.  Tell your health care provider if: ? You often feel depressed. ? You have ever been abused or do not feel safe at home. Summary  Adopting a healthy lifestyle and getting preventive care are important in promoting health and wellness.  Follow your health care provider's instructions about healthy  diet, exercising, and getting tested or screened for diseases.  Follow your health care provider's instructions on monitoring your cholesterol and blood pressure. This information is not intended to replace advice given to you by your health care provider. Make sure you discuss any questions you have with your health care provider. Document Revised: 11/02/2018 Document Reviewed: 11/02/2018 Elsevier Patient Education  2020 [employer] Inc[employer]  

## 2020-03-29 ENCOUNTER — Encounter: Payer: Self-pay | Admitting: Family Medicine

## 2020-04-08 ENCOUNTER — Other Ambulatory Visit: Payer: Self-pay | Admitting: Family Medicine

## 2020-04-09 NOTE — Telephone Encounter (Signed)
Refill request for fluoxetime 40 mg, not previously prescribed by you.  Please advise?

## 2020-05-14 ENCOUNTER — Encounter: Payer: Self-pay | Admitting: Family Medicine

## 2020-05-16 ENCOUNTER — Encounter: Payer: Self-pay | Admitting: Family Medicine

## 2020-05-16 NOTE — Telephone Encounter (Signed)
I'm going to deny this. She has been getting this med prescribed by Dr. Berle Mull. She needs to continue to request this med from him.

## 2020-05-16 NOTE — Telephone Encounter (Signed)
Patient is requesting refill for tramadol. This medication was not originally prescribed by you.  Requesting: tramadol Contract:n/a UDS:n/a Last Visit:03/28/20 Next Visit:advised to f/u 5mo. Last Refill:n/a  Please Advise. Medication pending

## 2020-05-17 NOTE — Telephone Encounter (Signed)
Tylenol 1000 mg every 6 hours as needed for pain.

## 2020-05-17 NOTE — Telephone Encounter (Signed)
Do you have any recommendations for medications she can take? She cannot afford a visit with Dr.Kramer at this time.

## 2020-05-18 ENCOUNTER — Encounter: Payer: Self-pay | Admitting: Family Medicine

## 2020-05-20 NOTE — Telephone Encounter (Signed)
MyChart message read.

## 2020-05-28 ENCOUNTER — Telehealth: Payer: Self-pay

## 2020-05-28 NOTE — Telephone Encounter (Signed)
LM for pt to returncall

## 2020-05-28 NOTE — Telephone Encounter (Signed)
Olivia Mckee is experiencing pain all over. Requests call back whether she needs to make an appointment with Dr. Anitra Lauth or a specialist.

## 2020-05-28 NOTE — Telephone Encounter (Signed)
Appt with me, NOT a work-in.-thx

## 2020-05-28 NOTE — Telephone Encounter (Signed)
Patient believes she may have fibromyalgia, in the last 3 months pain started occurring in legs and is now spreading throughout entire body.   Appt with you or referral?

## 2020-05-29 ENCOUNTER — Other Ambulatory Visit: Payer: Self-pay

## 2020-05-29 NOTE — Telephone Encounter (Signed)
Patient notified to schedule appt with PCP. Will call back later today to set this up.

## 2020-05-29 NOTE — Telephone Encounter (Signed)
Patient was scheduled for 05/30/20

## 2020-05-30 ENCOUNTER — Other Ambulatory Visit: Payer: Self-pay

## 2020-05-30 ENCOUNTER — Encounter: Payer: Self-pay | Admitting: Family Medicine

## 2020-05-30 ENCOUNTER — Ambulatory Visit (INDEPENDENT_AMBULATORY_CARE_PROVIDER_SITE_OTHER): Payer: Commercial Managed Care - PPO | Admitting: Family Medicine

## 2020-05-30 VITALS — BP 109/69 | HR 93 | Temp 98.1°F | Resp 16 | Ht 64.5 in | Wt 225.6 lb

## 2020-05-30 DIAGNOSIS — R208 Other disturbances of skin sensation: Secondary | ICD-10-CM | POA: Diagnosis not present

## 2020-05-30 DIAGNOSIS — R5382 Chronic fatigue, unspecified: Secondary | ICD-10-CM

## 2020-05-30 DIAGNOSIS — M791 Myalgia, unspecified site: Secondary | ICD-10-CM | POA: Diagnosis not present

## 2020-05-30 LAB — CBC WITH DIFFERENTIAL/PLATELET
Basophils Absolute: 0.1 10*3/uL (ref 0.0–0.1)
Basophils Relative: 1.3 % (ref 0.0–3.0)
Eosinophils Absolute: 0.5 10*3/uL (ref 0.0–0.7)
Eosinophils Relative: 5.6 % — ABNORMAL HIGH (ref 0.0–5.0)
HCT: 38.7 % (ref 36.0–46.0)
Hemoglobin: 12.8 g/dL (ref 12.0–15.0)
Lymphocytes Relative: 24 % (ref 12.0–46.0)
Lymphs Abs: 1.9 10*3/uL (ref 0.7–4.0)
MCHC: 33.1 g/dL (ref 30.0–36.0)
MCV: 85 fl (ref 78.0–100.0)
Monocytes Absolute: 0.6 10*3/uL (ref 0.1–1.0)
Monocytes Relative: 7.3 % (ref 3.0–12.0)
Neutro Abs: 5 10*3/uL (ref 1.4–7.7)
Neutrophils Relative %: 61.8 % (ref 43.0–77.0)
Platelets: 337 10*3/uL (ref 150.0–400.0)
RBC: 4.56 Mil/uL (ref 3.87–5.11)
RDW: 17 % — ABNORMAL HIGH (ref 11.5–15.5)
WBC: 8 10*3/uL (ref 4.0–10.5)

## 2020-05-30 LAB — CK: Total CK: 104 U/L (ref 7–177)

## 2020-05-30 LAB — COMPREHENSIVE METABOLIC PANEL
ALT: 39 U/L — ABNORMAL HIGH (ref 0–35)
AST: 25 U/L (ref 0–37)
Albumin: 4.3 g/dL (ref 3.5–5.2)
Alkaline Phosphatase: 112 U/L (ref 39–117)
BUN: 20 mg/dL (ref 6–23)
CO2: 23 mEq/L (ref 19–32)
Calcium: 9.8 mg/dL (ref 8.4–10.5)
Chloride: 106 mEq/L (ref 96–112)
Creatinine, Ser: 0.94 mg/dL (ref 0.40–1.20)
GFR: 60.85 mL/min (ref >60.00)
Glucose, Bld: 112 mg/dL — ABNORMAL HIGH (ref 70–99)
Potassium: 4.1 mEq/L (ref 3.5–5.1)
Sodium: 138 mEq/L (ref 135–145)
Total Bilirubin: 0.2 mg/dL (ref 0.2–1.2)
Total Protein: 7.2 g/dL (ref 6.0–8.3)

## 2020-05-30 LAB — C-REACTIVE PROTEIN: CRP: 1.6 mg/dL (ref 0.5–20.0)

## 2020-05-30 LAB — SEDIMENTATION RATE: Sed Rate: 50 mm/hr — ABNORMAL HIGH (ref 0–30)

## 2020-05-30 NOTE — Progress Notes (Signed)
OFFICE VISIT  05/30/2020   CC:  Chief Complaint  Patient presents with  . Pain   HPI:    Patient is a 59 y.o. Caucasian adult who presents for pain. She established care with me 03/07/20, switched from [facility] in [place]. Reported to me at that time that she was on tramadol rx'd by Dr. Alfonso Ramus (what office?, ortho?) for "shoulder and feet pain" at that time.  Fell on shoulders 3 yrs ago, saw him and he thinks she may have torn RC but she cannot afford MRI.  Eventually was put on tramadol by him a couple years ago.  Feet pain she thinks is plantar fasciitis, which seems to still wax and wane.  I saw her for CPE 03/28/20 and no mention of pain was made at that time. I have no records from Dr. Alfonso Ramus.  There is no musculoskeletal imaging in our EMR.  HPI: About 3 mo hx of pain in ankles and feet, wrists hurt, shoulders hurt. Says it is actually skin and soft tissues of skin in these areas that hurt, not in any actual joint. Arms and legs diffusely as well?? Pt poor historian, giving equivocal answers even to specific/direct questioning. Gradually getting worse.  Essentially ANYWHERE she pinches her skin she says it feels burning/"on fire".  No rash.  No itching.  Sitting still and doing nothing she does not have any burning or pain.  Just when she pinches the skin or something comes up against skin firmly. Walking around does not elicit the sx's.  Feet feel cold a lot.  Hands don't feel cold.  No color changes in feet or hands.  No headaches or vision complaints. Says this started prior to being started on phentermine.  Has never had this before.   She is tired all the time.  +excessive daytime sleepiness, but no prob staying awake and alert when driving. She snores but has no apneic events in sleep. Has never had a sleep study. Has been rx'd flexeril for leg cramps.   Exercise: walks around yard, is very limited by her COPD.   PMP AWARE reviewed today: most recent rx for tramadol was  filled 05/20/20, # 66, rx by Dr. Berle Mull. Also noted is a rx for lomaira (phentermine) 58m filled 05/24/20, #60, [facility]. No red flags.  ROS: mild constipation---takes stool softener and helps. No fevers, no CP, no SOB, no wheezing, no cough, no dizziness, no HAs, no rashes, no melena/hematochezia.  No polyuria or polydipsia.  No focal weakness, or tremors.  No acute vision or hearing abnormalities. No n/v/d or abd pain.  No palpitations.     Past Medical History:  Diagnosis Date  . Alcoholism ([place]   . Allergic rhinitis   . Anxiety   . Cervical cancer ([place]   . COPD (chronic obstructive pulmonary disease) ([place]   . Diverticulosis 2015   Sigmoid colon (noted on colonoscopy)  . GERD (gastroesophageal reflux disease)   . History of adenomatous polyp of colon 04/02/2014   Recall 5 yrs  . History of pyelonephritis 12/2019  . Hypercholesterolemia   . Insomnia   . MDD (major depressive disorder)   . Perforation of cecum    perf'd in context of SBO that followed GB surgery; no colon removed.    Past Surgical History:  Procedure Laterality Date  . BREAST BIOPSY  2010   benign cyst  . CHOLECYSTECTOMY    . COLONOSCOPY  04/02/2014   2015 adenoma x 2->recall  5 yrs ([facility])  . Iliostomy     +  takedown  . perfor colon repair     SBO after gallbladder surgery  . TONSILLECTOMY    . TOTAL ABDOMINAL HYSTERECTOMY W/ BILATERAL SALPINGOOPHORECTOMY  1984   cervical precancer. Ovaries were fine but pt told surgeon to take them out.  . VENTRAL HERNIA REPAIR  2016   open, with mesh    Outpatient Medications Prior to Visit  Medication Sig Dispense Refill  . ALPRAZolam (XANAX) 0.5 MG tablet Take 1 tablet (0.5 mg total) by mouth 3 (three) times daily as needed. 90 tablet 5  . benzonatate (TESSALON) 200 MG capsule Take 1 capsule (200 mg total) by mouth 3 (three) times daily as needed for cough. 30 capsule 0  . cyclobenzaprine (FLEXERIL) 10  MG tablet Take 10 mg by mouth 2 (two) times daily as needed.    Marland Kitchen FLUoxetine (PROZAC) 40 MG capsule Take 1 capsule (40 mg total) by mouth daily. 90 capsule 3  . Glycopyrrolate-Formoterol (BEVESPI AEROSPHERE) 9-4.8 MCG/ACT AERO Inhale 2 puffs into the lungs 2 (two) times daily. 32.1 g 1  . LOMAIRA 8 MG TABS Take 1 tablet by mouth 2 (two) times daily.    . meloxicam (MOBIC) 15 MG tablet Take 15 mg by mouth daily.    . pantoprazole (PROTONIX) 40 MG tablet TAKE 1 TABLET BY MOUTH TWICE A DAY 180 tablet 0  . pravastatin (PRAVACHOL) 10 MG tablet Take 10 mg by mouth daily.    Marland Kitchen topiramate (TOPAMAX) 50 MG tablet Take 1/2 tablet po daily for one week and then increase to a full pill    . traMADol (ULTRAM) 50 MG tablet Take 50 mg by mouth 4 (four) times daily as needed.    . zolpidem (AMBIEN) 10 MG tablet Take 1 tablet (10 mg total) by mouth at bedtime as needed. 90 tablet 1  . fluconazole (DIFLUCAN) 200 MG tablet Take 200 mg by mouth daily. (Patient not taking: Reported on 05/30/2020)    . valACYclovir (VALTREX) 500 MG tablet TAKE 1 TABLET BY MOUTH THEN TAKE 1 TABLET 12 HOURS LATER (Patient not taking: Reported on 05/30/2020)    . magic mouthwash w/lidocaine SOLN 5 ml po swish and swallow qid prn (Patient not taking: Reported on 05/30/2020) 100 mL 1   No facility-administered medications prior to visit.    Allergies  Allergen Reactions  . Penicillins Anaphylaxis and Rash  . Sulfa Antibiotics Anaphylaxis and Rash    ROS As per HPI  PE: Vitals with BMI 05/30/2020 03/28/2020 03/07/2020  Height 5' 4.5" 5' 4.5" 5' 3"   Weight 225 lbs 10 oz 233 lbs 236 lbs 10 oz  BMI 38.14 73.42 87.68  Systolic 115 726 203  Diastolic 69 73 64  Pulse 93 82 85  O2 sat on RA today is 95%  Gen: Alert, well appearing.  Patient is oriented to person, place, time, and situation. AFFECT: pleasant, lucid thought and speech. ENT: Eyes: no injection, icteris, swelling, or exudate.  EOMI, PERRLA. Nose: no drainage or turbinate  edema/swelling.  No injection or focal lesion.  Mouth: lips without lesion/swelling.  Oral mucosa pink and moist.  Dentition intact and without obvious caries or gingival swelling.  Oropharynx without erythema, exudate, or swelling.  Neck: supple/nontender.  No LAD, mass, or TM.  C CV: RRR, no m/r/g.   LUNGS: CTA bilat, nonlabored resps, good aeration in all lung fields. ABD: soft, NT, ND EXT: no clubbing, cyanosis, or edema. DP and  PT pulses 2+ bilat.  No pallor or cyanosis of toes or feet.  Musculoskeletal: no joint swelling, erythema, warmth, or tenderness.  ROM of all joints intact.  Equivocal "discomfort" or "stretching" sensation when wrists and ankles are flexed/extended.  Pinching of the skin and subQ fat elicits a burning sensation essentially anywhere on her body.  She has some diffuse upper back TTP w/out focal point. Skin - no sores or suspicious lesions or rashes or color changes    LABS:  Lab Results  Component Value Date   TSH 1.71 03/28/2020   Lab Results  Component Value Date   WBC 8.2 03/28/2020   HGB 13.1 03/28/2020   HCT 38.7 03/28/2020   MCV 87.9 03/28/2020   PLT 333.0 03/28/2020   Lab Results  Component Value Date   CREATININE 0.87 03/28/2020   BUN 15 03/28/2020   NA 137 03/28/2020   K 4.3 03/28/2020   CL 104 03/28/2020   CO2 26 03/28/2020   Lab Results  Component Value Date   ALT 20 03/28/2020   AST 18 03/28/2020   ALKPHOS 88 03/28/2020   BILITOT 0.2 03/28/2020   Lab Results  Component Value Date   CHOL 179 03/28/2020   Lab Results  Component Value Date   HDL 55.00 03/28/2020   No results found for: [facility] Lab Results  Component Value Date   TRIG 212.0 (H) 03/28/2020   Lab Results  Component Value Date   CHOLHDL 3 03/28/2020     IMPRESSION AND PLAN:  1) Dysesthesia, with question of some myalgia component to things.  Possibly upper body/shoulders more than lower body.  Chronic fatigue. No alarming neurologic, rheumatologic, or  connective tissue-type symptoms or signs are present.  ? PMR. Will check CBC, CMET, parvo B19 ab's, ESR, CRP, vit B6, and CPK level. No rx's given today. No imaging indicated at this time.  Spent 40 min with pt today, with >50% of this time spent in counseling and care coordination regarding the above problems.  An After Visit Summary was printed and given to the patient.  FOLLOW UP: to be determined based on results of w/u  Signed:  Crissie Sickles, MD           05/30/2020

## 2020-06-04 ENCOUNTER — Telehealth: Payer: Self-pay | Admitting: Emergency Medicine

## 2020-06-04 MED ORDER — AZITHROMYCIN 250 MG PO TABS
ORAL_TABLET | ORAL | 0 refills | Status: DC
Start: 2020-06-04 — End: 2020-06-24

## 2020-06-04 MED ORDER — PREDNISONE 10 MG PO TABS
ORAL_TABLET | ORAL | 0 refills | Status: DC
Start: 2020-06-04 — End: 2020-06-24

## 2020-06-04 NOTE — Telephone Encounter (Signed)
Called and spoke with pt who is requesting to have prednisone sent to pharmacy. Pt stated 3 weeks ago she began to have complaints of wheezing, coughing, and SOB.  Pt stated symptoms started to get worse about 1 week ago and then 2 days ago she woke up wheezing more and coughing more. If pt is able to get any phlegm up, she states that it is green-yellow in color.  Pt is using her bevespi inhaler as prescribed, has been taking benzonatate as needed and using her proair inhaler about twice daily over the past 2 weeks.  Pt denied any complaints of fever and while on the phone with her, temp was 97.9. pt has not been around anyone that has had covid. Pt has also been vaccinated for covid (immunization list is updated).  Dr. Lamonte Sakai, please advise.

## 2020-06-04 NOTE — Telephone Encounter (Signed)
Called pt and advised message from the provider. Pt understood and verbalized understanding. Nothing further is needed.   Rx's sent in to her pharmacy.

## 2020-06-04 NOTE — Telephone Encounter (Signed)
Please order pred >> Take 40mg  daily for 3 days, then 30mg  daily for 3 days, then 20mg  daily for 3 days, then 10mg  daily for 3 days, then stop  Azithro > Z pack  She needs to call if sx persist so we can see her in office.

## 2020-06-05 LAB — PARVOVIRUS B19 ANTIBODY, IGG AND IGM
Parvovirus B19 IgG: 7.1 — ABNORMAL HIGH (ref <0.9)
Parvovirus B19 IgM: 0.1 (ref <0.9)

## 2020-06-05 LAB — VITAMIN B6: Vitamin B6: 7.7 ng/mL (ref 2.1–21.7)

## 2020-06-06 ENCOUNTER — Encounter: Payer: Self-pay | Admitting: Family Medicine

## 2020-06-06 ENCOUNTER — Other Ambulatory Visit: Payer: Self-pay

## 2020-06-06 DIAGNOSIS — R700 Elevated erythrocyte sedimentation rate: Secondary | ICD-10-CM

## 2020-06-06 NOTE — Telephone Encounter (Signed)
Patient worried about test results. She spoke with someone earlier this morning about her results and didn't quite understand, and is wanting someone to go over test results.  I cant find my documentation where I spoke to her this morning, I am starting with this note since it ties all together with her lab results.   She called back this afternoon (a 2nd call today 7/15) She just seen results where she has Contains abnormal dataParvovirus  And is "freaking out' she says.  She doesn't know what it is, she searched on google and she said it was not good.  I told her that as soon as someone was available they would call her back. She understood.  [phone]

## 2020-06-06 NOTE — Telephone Encounter (Signed)
Patient has been advised of results and clarified any confusion.

## 2020-06-06 NOTE — Telephone Encounter (Signed)
Patient was contacted and reviewed results with her again to clarify any confusion. Nothing further needed

## 2020-06-07 ENCOUNTER — Telehealth: Payer: Self-pay | Admitting: Emergency Medicine

## 2020-06-07 MED ORDER — LEVOFLOXACIN 500 MG PO TABS
500.0000 mg | ORAL_TABLET | Freq: Every day | ORAL | 0 refills | Status: DC
Start: 2020-06-07 — End: 2020-06-24

## 2020-06-07 NOTE — Telephone Encounter (Signed)
I could change her abx if she does not believe she is benefiting from [employer]. If she fails to respond through the weekend then she will likely need to seek care in the ED.  Send levaquin 500mg  qd x 7 days

## 2020-06-07 NOTE — Telephone Encounter (Signed)
Pt was just prescribed prednisone taper as well as zpak 06/04/20.  Dr. Lamonte Sakai, please advise on message sent by pt as she stated that she is no better and is having a hard time now coughing up yellow to brown phlegm.

## 2020-06-07 NOTE — Telephone Encounter (Signed)
Yes ok for to take together short term. Thanks.

## 2020-06-07 NOTE — Telephone Encounter (Signed)
Called pt's pharmacy and spoke with [name].  Per [name], levofloxacin with prednisone is considered a level 1 contraindication. [name] wants to make sure that RB is still okay with pt taking these two meds together as prescribed. Dr. Lamonte Sakai, please advise.

## 2020-06-07 NOTE — Telephone Encounter (Signed)
Addressed in her phone note - plan to broaden abx to levaquin and follow for improvement. If she does not improve then she needs to go to ED

## 2020-06-07 NOTE — Telephone Encounter (Signed)
Spoke with the pt and notified of recs per Dr. Lamonte Sakai  She verbalized understanding  Rx was sent

## 2020-06-07 NOTE — Telephone Encounter (Signed)
Called and spoke with [name] at pharmacy stating to her that [facility] was fine with pt taking both prednisone and levofloxacin short term and she verbalized understanding. Nothing further needed.

## 2020-06-14 ENCOUNTER — Other Ambulatory Visit: Payer: Self-pay

## 2020-06-14 MED ORDER — BENZONATATE 200 MG PO CAPS: 200.0000 mg | ORAL_CAPSULE | Freq: Three times a day (TID) | ORAL | 0 refills | Status: DC | PRN

## 2020-06-17 ENCOUNTER — Telehealth: Payer: Self-pay | Admitting: Emergency Medicine

## 2020-06-17 MED ORDER — FLUCONAZOLE 100 MG PO TABS: 100.0000 mg | ORAL_TABLET | Freq: Every day | ORAL | 0 refills | Status: DC

## 2020-06-17 NOTE — Telephone Encounter (Signed)
Okay to give fluconazole 1 mg daily for the next 3 days

## 2020-06-17 NOTE — Telephone Encounter (Signed)
Spoke with the pt  She states has a vaginal yeast infection "from all the antibiotics"  Would like rx for something to clear this up- requests fluconazole  Please advise, thanks!

## 2020-06-17 NOTE — Telephone Encounter (Signed)
Called and spoke with Patient.  Dr Agustina Caroli recommendations given.  Understanding stated.  Fluconazole prescription sent to requested [facility].  Nothing further at this time.

## 2020-06-18 ENCOUNTER — Ambulatory Visit (INDEPENDENT_AMBULATORY_CARE_PROVIDER_SITE_OTHER): Payer: Commercial Managed Care - PPO | Admitting: Family Medicine

## 2020-06-18 ENCOUNTER — Other Ambulatory Visit: Payer: Self-pay

## 2020-06-18 DIAGNOSIS — R700 Elevated erythrocyte sedimentation rate: Secondary | ICD-10-CM

## 2020-06-18 LAB — SEDIMENTATION RATE: Sed Rate: 13 mm/hr (ref 0–30)

## 2020-06-20 ENCOUNTER — Encounter: Payer: Self-pay | Admitting: Family Medicine

## 2020-06-21 ENCOUNTER — Other Ambulatory Visit: Payer: Self-pay | Admitting: Emergency Medicine

## 2020-06-21 ENCOUNTER — Telehealth: Payer: Self-pay | Admitting: Internal Medicine

## 2020-06-21 ENCOUNTER — Telehealth: Payer: Self-pay | Admitting: Emergency Medicine

## 2020-06-21 MED ORDER — NYSTATIN 100000 UNIT/ML MT SUSP
5.0000 mL | Freq: Four times a day (QID) | OROMUCOSAL | 0 refills | Status: DC
Start: 2020-06-21 — End: 2020-09-25

## 2020-06-21 MED ORDER — NYSTATIN 100000 UNIT/ML MT SUSP
20.0000 mL | Freq: Three times a day (TID) | OROMUCOSAL | 0 refills | Status: DC
Start: 2020-06-21 — End: 2020-09-25

## 2020-06-21 NOTE — Telephone Encounter (Signed)
Called and spoke with patient and provided instructions to patient per Dr. Lamonte Sakai.  She verbalized understanding.  Verified pharmacy and script sent.  Nothing further needed.

## 2020-06-21 NOTE — Telephone Encounter (Signed)
Confirmed appt for Monday with Dr. Anitra Lauth. She was trying to say that the prednisone makes her blood test results go abnormal. So if she could just hold off on the appt by come in later when she is OFF of prednisone.

## 2020-06-21 NOTE — Telephone Encounter (Signed)
Requesting 5 days rx for nystatin  But rx says 20cc tid and only has 60 cc   rx should have been 5 cc  tid   rx  60 more cc's  Re wrote rx, did not answer at number given to answering service

## 2020-06-21 NOTE — Telephone Encounter (Signed)
Called and spoke with pt. Pt stated that she took the fluconazole for the thrush which was prescribed by Dr. Lamonte Sakai but pt states that she is still having problems with the thrush.  Pt is wanting to know if she can get another Rx to help with the thrush. Dr. Lamonte Sakai, please advise. Also, please advise if we can add some refills on the Rx for pt in case she needs a refill of med.

## 2020-06-21 NOTE — Telephone Encounter (Signed)
Try nystatin S&S, 20cc TID for 5 days

## 2020-06-24 ENCOUNTER — Ambulatory Visit (INDEPENDENT_AMBULATORY_CARE_PROVIDER_SITE_OTHER): Payer: Commercial Managed Care - PPO | Admitting: Family Medicine

## 2020-06-24 ENCOUNTER — Encounter: Payer: Self-pay | Admitting: Family Medicine

## 2020-06-24 ENCOUNTER — Other Ambulatory Visit: Payer: Self-pay

## 2020-06-24 VITALS — BP 95/59 | HR 79 | Temp 98.0°F | Resp 16 | Ht 64.5 in | Wt 226.0 lb

## 2020-06-24 DIAGNOSIS — M2550 Pain in unspecified joint: Secondary | ICD-10-CM | POA: Diagnosis not present

## 2020-06-24 DIAGNOSIS — R700 Elevated erythrocyte sedimentation rate: Secondary | ICD-10-CM

## 2020-06-24 DIAGNOSIS — R208 Other disturbances of skin sensation: Secondary | ICD-10-CM | POA: Diagnosis not present

## 2020-06-24 DIAGNOSIS — R5382 Chronic fatigue, unspecified: Secondary | ICD-10-CM | POA: Diagnosis not present

## 2020-06-24 DIAGNOSIS — M791 Myalgia, unspecified site: Secondary | ICD-10-CM

## 2020-06-24 NOTE — Progress Notes (Signed)
OFFICE VISIT  06/24/2020   CC:  Chief Complaint  Patient presents with  . Follow-up    dysesthesia   HPI:    Patient is a 59 y.o. Caucasian adult who presents for 3 week f/u dysesthesias. A/P as of last visit: "Dysesthesia, with question of some myalgia component to things.  Possibly upper body/shoulders more than lower body.  Chronic fatigue. No alarming neurologic, rheumatologic, or connective tissue-type symptoms or signs are present.  ? PMR. Will check CBC, CMET, parvo B19 ab's, ESR, CRP, vit B6, and CPK level. No rx's given today. No imaging indicated at this time."  INTERIM HX: Labs all normal last visit except ESR mildly elevated: 50.  CRP was normal. Repeat ESR about 2 weeks later was 18.  She tells me today that she got prednisone taper for bronchitis about 2 wks ago, says she felt very good while on prednisone: no pain, no fatigue.  Says feeling of burning sensitivity in hands, feet, neck, forearms, shoulders, upper arms all went away.  Anything that puts pressure on any part of her body is painful. About the 5th day after finished steroids all her sx's came back.   Her resp sx's have resolved now.  No joint swelling or redness.  No joint warmth.  No rash or abnormal bruising. Denies temporal HA or vision abnormalities.  ROS: no fevers, no CP, no SOB, no wheezing, no cough, no dizziness, +tension-type HAs, no rashes, no melena/hematochezia.  No polyuria or polydipsia.  No focal weakness, paresthesias, or tremors.  No acute vision or hearing abnormalities. No n/v/d or abd pain.  No palpitations.     Past Medical History:  Diagnosis Date  . Alcoholism ([place])   . Allergic rhinitis   . Anxiety   . Cervical cancer ([place])   . COPD (chronic obstructive pulmonary disease) ([place])   . Diverticulosis 2015   Sigmoid colon (noted on colonoscopy)  . GERD (gastroesophageal reflux disease)   . History of adenomatous polyp of colon 04/02/2014   Recall 5 yrs  . History of  pyelonephritis 12/2019  . Hypercholesterolemia   . Insomnia   . MDD (major depressive disorder)   . Perforation of cecum    perf'd in context of SBO that followed GB surgery; no colon removed.    Past Surgical History:  Procedure Laterality Date  . BREAST BIOPSY  2010   benign cyst  . CHOLECYSTECTOMY    . COLONOSCOPY  04/02/2014   2015 adenoma x 2->recall 5 yrs ([facility])  . Iliostomy     +  takedown  . perfor colon repair     SBO after gallbladder surgery  . TONSILLECTOMY    . TOTAL ABDOMINAL HYSTERECTOMY W/ BILATERAL SALPINGOOPHORECTOMY  1984   cervical precancer. Ovaries were fine but pt told surgeon to take them out.  . VENTRAL HERNIA REPAIR  2016   open, with mesh    Outpatient Medications Prior to Visit  Medication Sig Dispense Refill  . ALPRAZolam (XANAX) 0.5 MG tablet Take 1 tablet (0.5 mg total) by mouth 3 (three) times daily as needed. 90 tablet 5  . benzonatate (TESSALON) 200 MG capsule Take 1 capsule (200 mg total) by mouth 3 (three) times daily as needed for cough. 30 capsule 0  . cyclobenzaprine (FLEXERIL) 10 MG tablet Take 10 mg by mouth 2 (two) times daily as needed.    Marland Kitchen FLUoxetine (PROZAC) 40 MG capsule Take 1 capsule (40 mg total) by mouth daily. 90 capsule 3  .  Glycopyrrolate-Formoterol (BEVESPI AEROSPHERE) 9-4.8 MCG/ACT AERO Inhale 2 puffs into the lungs 2 (two) times daily. 32.1 g 1  . LOMAIRA 8 MG TABS Take 1 tablet by mouth 2 (two) times daily.    . meloxicam (MOBIC) 15 MG tablet Take 15 mg by mouth daily.    Marland Kitchen nystatin (MYCOSTATIN) 100000 UNIT/ML suspension Take 20 mLs (2,000,000 Units total) by mouth 3 (three) times daily. Swish and swallow 20cc 3 times a day for 5 days. 60 mL 0  . nystatin (MYCOSTATIN) 100000 UNIT/ML suspension Take 5 mLs (500,000 Units total) by mouth 4 (four) times daily. 60 mL 0  . pantoprazole (PROTONIX) 40 MG tablet TAKE 1 TABLET BY MOUTH TWICE A DAY 180 tablet 0  . pravastatin (PRAVACHOL) 10 MG tablet Take 10 mg by mouth  daily.    Marland Kitchen topiramate (TOPAMAX) 50 MG tablet Take 1/2 tablet po daily for one week and then increase to a full pill    . traMADol (ULTRAM) 50 MG tablet Take 50 mg by mouth 4 (four) times daily as needed.    . valACYclovir (VALTREX) 500 MG tablet TAKE 1 TABLET BY MOUTH THEN TAKE 1 TABLET 12 HOURS LATER    . zolpidem (AMBIEN) 10 MG tablet Take 1 tablet (10 mg total) by mouth at bedtime as needed. 90 tablet 1  . azithromycin (ZITHROMAX) 250 MG tablet Take 2 pills today then one a day for 4 additional days (Patient not taking: Reported on 06/24/2020) 6 tablet 0  . fluconazole (DIFLUCAN) 100 MG tablet Take 1 tablet (100 mg total) by mouth daily. (Patient not taking: Reported on 06/24/2020) 3 tablet 0  . levofloxacin (LEVAQUIN) 500 MG tablet Take 1 tablet (500 mg total) by mouth daily. (Patient not taking: Reported on 06/24/2020) 7 tablet 0  . predniSONE (DELTASONE) 10 MG tablet Take 4 tabs by mouth for 3 days, then 3 for 3 days, 2 for 3 days, 1 for 3 days and stop (Patient not taking: Reported on 06/24/2020) 30 tablet 0   No facility-administered medications prior to visit.    Allergies  Allergen Reactions  . Penicillins Anaphylaxis and Rash  . Sulfa Antibiotics Anaphylaxis and Rash    ROS As per HPI  PE: Vitals with BMI 06/24/2020 05/30/2020 03/28/2020  Height 5' 4.5" 5' 4.5" 5' 4.5"  Weight 226 lbs 225 lbs 10 oz 233 lbs  BMI 38.21 08.65 78.46  Systolic 95 962 952  Diastolic 59 69 73  Pulse 79 93 82  O2 sat on RA today is 98% Gen: Alert, well appearing.  Patient is oriented to person, place, time, and situation. AFFECT: pleasant, lucid thought and speech. Joints: all joints without swelling or erythema, no deformity She is TTP over ANY area of her body that I palpate: scalp, neck, shoulders, back, upper arms, elbows, forearms, wrists, fingers, hips, thighs, knees, calves, ankles, feet/toes. Strength testing is 5/5 prox and dist mm's bilat.  LABS:  Lab Results  Component Value Date   TSH 1.71  03/28/2020   Lab Results  Component Value Date   WBC 8.0 05/30/2020   HGB 12.8 05/30/2020   HCT 38.7 05/30/2020   MCV 85.0 05/30/2020   PLT 337.0 05/30/2020   Lab Results  Component Value Date   CREATININE 0.94 05/30/2020   BUN 20 05/30/2020   NA 138 05/30/2020   K 4.1 05/30/2020   CL 106 05/30/2020   CO2 23 05/30/2020   Lab Results  Component Value Date   ALT 39 (H) 05/30/2020  AST 25 05/30/2020   ALKPHOS 112 05/30/2020   BILITOT 0.2 05/30/2020   Lab Results  Component Value Date   CHOL 179 03/28/2020   Lab Results  Component Value Date   HDL 55.00 03/28/2020   No results found for: [facility] Lab Results  Component Value Date   TRIG 212.0 (H) 03/28/2020   Lab Results  Component Value Date   CHOLHDL 3 03/28/2020   Lab Results  Component Value Date   ESRSEDRATE 13 06/18/2020   Lab Results  Component Value Date   CKTOTAL 104 05/30/2020   Lab Results  Component Value Date   CRP 1.6 05/30/2020   IMPRESSION AND PLAN:  1) Fatigue, widespread soft tissue and joint/bone pain, very tender/sensitive to mild pressure/palpation EVERYWHERE.  Recent mildly elevated ESR. Repeat ESR normal but pt was on prednisone for acute bronchitis at the time of this repeat. She felt ALL SXs completely gone while on prednisone--return of all sx's shortly after finishing prednisone. All of this has been going on for 4 mo or so.  All blood w/u reassuring except mild inc ESR. I have not done a battery or rheum blood work, though.  I will ask rheum to see.  Repeat ESR and CRP today: has been off prednisone for about 10-12 d now. Suspicion of fibromyalgia is pretty high.  An After Visit Summary was printed and given to the patient.  FOLLOW UP: Return in about 3 months (around 09/24/2020) for routine chronic illness f/u.  Signed:  Crissie Sickles, MD           06/24/2020

## 2020-06-25 LAB — C-REACTIVE PROTEIN: CRP: 1.8 mg/dL (ref 0.5–20.0)

## 2020-06-25 LAB — SEDIMENTATION RATE: Sed Rate: 10 mm/hr (ref 0–30)

## 2020-06-26 ENCOUNTER — Encounter: Payer: Self-pay | Admitting: Family Medicine

## 2020-07-02 ENCOUNTER — Telehealth: Payer: Self-pay | Admitting: Family Medicine

## 2020-07-02 NOTE — Telephone Encounter (Signed)
Records have been faxed to [facility]

## 2020-07-02 NOTE — Telephone Encounter (Signed)
Pt calling to have her records faxed to [facility] please. Dr Patrecia Pour cannot see her until January and she prefers that practice bc they can see her sooner.

## 2020-07-09 ENCOUNTER — Telehealth: Payer: Self-pay

## 2020-07-09 NOTE — Telephone Encounter (Signed)
As far as I know there is no recommendations for ANYONE to get a "booster" injection of covid vaccine.

## 2020-07-09 NOTE — Telephone Encounter (Signed)
Please advise if patient would be okay to get booster shot for covid vaccine

## 2020-07-09 NOTE — Telephone Encounter (Signed)
Patient is inquiring whether she should get the booster shot. She called her pulmonologist, they told her to check with PCP. Patient is aware that PCP is out this week.

## 2020-07-10 NOTE — Telephone Encounter (Signed)
MyChart message read.

## 2020-07-17 ENCOUNTER — Other Ambulatory Visit: Payer: Self-pay | Admitting: Family Medicine

## 2020-07-17 NOTE — Telephone Encounter (Signed)
RF request for cyclobenzaprine, medication not previously written by you. Start date of 12/13/19. Please advise if appropriate for refill, thanks.

## 2020-08-08 ENCOUNTER — Other Ambulatory Visit: Payer: Self-pay | Admitting: Emergency Medicine

## 2020-08-09 ENCOUNTER — Telehealth: Payer: Self-pay | Admitting: Family Medicine

## 2020-08-09 NOTE — Telephone Encounter (Signed)
Disregard, patient decided not to send message.

## 2020-08-12 ENCOUNTER — Telehealth: Payer: Self-pay | Admitting: Family Medicine

## 2020-08-12 DIAGNOSIS — Z86010 Personal history of colonic polyps: Secondary | ICD-10-CM

## 2020-08-12 DIAGNOSIS — R1310 Dysphagia, unspecified: Secondary | ICD-10-CM

## 2020-08-12 LAB — POCT ERYTHROCYTE SEDIMENTATION RATE, NON-AUTOMATED: Sed Rate: 46

## 2020-08-12 NOTE — Telephone Encounter (Signed)
Please advise, thanks.

## 2020-08-12 NOTE — Telephone Encounter (Signed)
Patient needs appointment. Dr.McGowen has not seen her for this issue

## 2020-08-12 NOTE — Telephone Encounter (Signed)
Patient is calling in asking for a referral to [facility] [place], [place]. States she has been having trouble swallowing, they are needing a referral for a endoscopy, and colonoscopy.

## 2020-08-12 NOTE — Telephone Encounter (Signed)
Patient states that she does not want an appointment for the trouble swallowing as she feels like he would not be able to help and she is due for the colonoscopy.

## 2020-08-12 NOTE — Telephone Encounter (Signed)
Referral ordered

## 2020-08-12 NOTE — Addendum Note (Signed)
Addended by: Tammi Sou on: 08/12/2020 02:17 PM   Modules accepted: Orders

## 2020-08-13 ENCOUNTER — Encounter: Payer: Self-pay | Admitting: Family Medicine

## 2020-08-14 ENCOUNTER — Encounter: Payer: Self-pay | Admitting: Family Medicine

## 2020-09-02 ENCOUNTER — Other Ambulatory Visit: Payer: Self-pay

## 2020-09-02 ENCOUNTER — Telehealth: Payer: Self-pay

## 2020-09-02 DIAGNOSIS — Z86010 Personal history of colonic polyps: Secondary | ICD-10-CM

## 2020-09-02 DIAGNOSIS — R1310 Dysphagia, unspecified: Secondary | ICD-10-CM

## 2020-09-02 MED ORDER — PANTOPRAZOLE SODIUM 40 MG PO TBEC: 40.0000 mg | DELAYED_RELEASE_TABLET | Freq: Two times a day (BID) | ORAL | 0 refills | Status: DC

## 2020-09-02 NOTE — Telephone Encounter (Signed)
Regarding referral to Dr. Collene Mares  Dr. Lorie Apley office called and stated that he has reviewed patient's chart and He will not be able to see patient.

## 2020-09-03 NOTE — Telephone Encounter (Signed)
Ask if she wants [place] to try a referral to [facility] gastroenterology.

## 2020-09-03 NOTE — Telephone Encounter (Addendum)
FYI   Pt will not go back to the GI dr that she has on her care team because of complications from surgery that she states was their fault. I told pt she can call Dr. Lorie Apley office if she is wanting a specific reason on why they will not take her as a pt.

## 2020-09-03 NOTE — Telephone Encounter (Signed)
She said she will go to [facility]

## 2020-09-03 NOTE — Addendum Note (Signed)
Addended by: Tammi Sou on: 09/03/2020 01:41 PM   Modules accepted: Orders

## 2020-09-04 ENCOUNTER — Encounter: Payer: Self-pay | Admitting: Family Medicine

## 2020-09-19 ENCOUNTER — Other Ambulatory Visit: Payer: Self-pay | Admitting: Family Medicine

## 2020-09-20 NOTE — Telephone Encounter (Signed)
Will do RF. I need to remember to do Tangent and UDS at next f/u 09/27/20.

## 2020-09-20 NOTE — Telephone Encounter (Signed)
Requesting: ALPRAZolam (XANAX) 0.5 MG tablet Contract: n/a UDS:n/a Last Visit:06/24/20 Next Visit:09/27/20 Last Refill: 03/15/20 (90,5)  Please Advise

## 2020-09-23 NOTE — Telephone Encounter (Signed)
Patient notified refill submitted for alprazolam.

## 2020-09-25 ENCOUNTER — Other Ambulatory Visit: Payer: Self-pay

## 2020-09-27 ENCOUNTER — Encounter: Payer: Self-pay | Admitting: Family Medicine

## 2020-09-27 ENCOUNTER — Ambulatory Visit (INDEPENDENT_AMBULATORY_CARE_PROVIDER_SITE_OTHER): Payer: Commercial Managed Care - PPO | Admitting: Family Medicine

## 2020-09-27 ENCOUNTER — Other Ambulatory Visit: Payer: Self-pay

## 2020-09-27 VITALS — BP 99/62 | HR 85 | Temp 97.6°F | Resp 16 | Ht 64.5 in | Wt 223.8 lb

## 2020-09-27 DIAGNOSIS — E7800 Pure hypercholesterolemia, unspecified: Secondary | ICD-10-CM

## 2020-09-27 DIAGNOSIS — F411 Generalized anxiety disorder: Secondary | ICD-10-CM

## 2020-09-27 DIAGNOSIS — M1380 Other specified arthritis, unspecified site: Secondary | ICD-10-CM

## 2020-09-27 DIAGNOSIS — Z79899 Other long term (current) drug therapy: Secondary | ICD-10-CM

## 2020-09-27 DIAGNOSIS — F330 Major depressive disorder, recurrent, mild: Secondary | ICD-10-CM

## 2020-09-27 NOTE — Progress Notes (Signed)
OFFICE VISIT  09/27/2020  CC:  Chief Complaint  Patient presents with  . Follow-up    HLD, anxiety/depression, insomnia. Pt is not fasting   HPI:    Patient is a 59 y.o. Caucasian adult who presents for 3 mo f/u insomnia, Anx/dep, HLD. Most recently we've been working her up for fibromyalgia-type sx's, pt had been adamant that ALL her sx's resolved when she was on steroids for bronchitis, then all sx's returned after off steroids a few days.  Lab eval by me unrevealing.  I referred her to rheumatology--she saw them on 07/31/20.  Interim hx: Rheum suspects seroneg inflamm arth.  Two week pred challenge did result in improvement. She was started on methotrexate q week injection about 2 mo ago, feels mild gen malaise on this med.  Some tongue ulcers and occ bad HA since getting on this med. Swimming for activity. Walking around yard/shopping makes her diffuse arthralgias/stiffness worse. Has f/u in about 10d with Rheum.  Mood/anx:  Still with chronic anxiety, struggling with approp grieving since death of her father within the last year.  Taking fluox 40mg  qd and alpraz 0.5mg  tid. Ambien helpful for sleep--adequate.  PMP AWARE reviewed today: most recent rx for alprazolam was filled 09/20/20, # 51, rx by me. Most recent ambien rx filled 07/16/20, #90, rx by me. Tramadol being rx'd by Dr. Alfonso Ramus for bilat shoulder pains, says they help mild/mod well. No red flags.  Past Medical History:  Diagnosis Date  . Alcoholism ([place])   . Allergic rhinitis   . Anxiety   . Cervical cancer ([place])   . COPD (chronic obstructive pulmonary disease) ([place])   . Diverticulosis 2015   Sigmoid colon (noted on colonoscopy)  . Elevated sedimentation rate    mildly elevated,+ arthralgias---she is tender EVERYWHERE (summer 2021)->rheum ref  . GERD (gastroesophageal reflux disease)   . History of adenomatous polyp of colon 04/02/2014   Recall 5 yrs  . History of pyelonephritis 12/2019  . Hypercholesterolemia    . Insomnia   . MDD (major depressive disorder)   . Myalgia, unspecified site    + arthralgias---she is tender EVERYWHERE (summer 2021)->rheum ref  . Perforation of cecum    perf'd in context of SBO that followed GB surgery; no colon removed.  . Polyarthralgia    Rheum eval 07/31/20 and started on 2 wks of low dose prednisone.    Past Surgical History:  Procedure Laterality Date  . BREAST BIOPSY  2010   benign cyst  . CHOLECYSTECTOMY    . COLONOSCOPY  04/02/2014   2015 adenoma x 2->recall 5 yrs ([facility])  . Iliostomy     +  takedown  . perfor colon repair     SBO after gallbladder surgery  . TONSILLECTOMY    . TOTAL ABDOMINAL HYSTERECTOMY W/ BILATERAL SALPINGOOPHORECTOMY  1984   cervical precancer. Ovaries were fine but pt told surgeon to take them out.  . VENTRAL HERNIA REPAIR  2016   open, with mesh    Outpatient Medications Prior to Visit  Medication Sig Dispense Refill  . ALPRAZolam (XANAX) 0.5 MG tablet TAKE 1 TABLET (0.5 MG TOTAL) BY MOUTH 3 (THREE) TIMES DAILY AS NEEDED. 90 tablet 5  . BEVESPI AEROSPHERE 9-4.8 MCG/ACT AERO TAKE 2 PUFFS BY MOUTH TWICE A DAY 32.1 each 1  . cyclobenzaprine (FLEXERIL) 10 MG tablet TAKE ONE TABLET (10 MG DOSE) BY MOUTH 2 (TWO) TIMES A DAY AS NEEDED FOR MUSCLE SPASMS. 180 tablet 1  . FLUoxetine (PROZAC)  40 MG capsule Take 1 capsule (40 mg total) by mouth daily. 90 capsule 3  . folic acid (FOLVITE) 1 MG tablet Take 1 mg by mouth daily.    Marland Kitchen LOMAIRA 8 MG TABS Take 1 tablet by mouth 2 (two) times daily.    . meloxicam (MOBIC) 15 MG tablet Take 15 mg by mouth daily.    . Methotrexate Sodium (METHOTREXATE, PF,) 50 MG/2ML injection     . pantoprazole (PROTONIX) 40 MG tablet Take 1 tablet (40 mg total) by mouth 2 (two) times daily. 180 tablet 0  . pravastatin (PRAVACHOL) 10 MG tablet Take 10 mg by mouth daily.    Marland Kitchen topiramate (TOPAMAX) 50 MG tablet Take 1/2 tablet po daily for one week and then increase to a full pill    . traMADol (ULTRAM) 50  MG tablet Take 50 mg by mouth 4 (four) times daily as needed.    . valACYclovir (VALTREX) 500 MG tablet TAKE 1 TABLET BY MOUTH THEN TAKE 1 TABLET 12 HOURS LATER    . zolpidem (AMBIEN) 10 MG tablet Take 1 tablet (10 mg total) by mouth at bedtime as needed. 90 tablet 1  . benzonatate (TESSALON) 200 MG capsule Take 1 capsule (200 mg total) by mouth 3 (three) times daily as needed for cough. (Patient not taking: Reported on 09/27/2020) 30 capsule 0   No facility-administered medications prior to visit.    Allergies  Allergen Reactions  . Penicillins Anaphylaxis and Rash  . Sulfa Antibiotics Anaphylaxis and Rash    ROS As per HPI  PE: Vitals with BMI 09/27/2020 06/24/2020 05/30/2020  Height 5' 4.5" 5' 4.5" 5' 4.5"  Weight 223 lbs 13 oz 226 lbs 225 lbs 10 oz  BMI 37.84 76.28 31.51  Systolic 99 95 761  Diastolic 62 59 69  Pulse 85 79 93     Gen: Alert, well appearing.  Patient is oriented to person, place, time, and situation. AFFECT: pleasant, lucid thought and speech. No further exam today.  LABS:  Lab Results  Component Value Date   TSH 1.71 03/28/2020   Lab Results  Component Value Date   WBC 8.0 05/30/2020   HGB 12.8 05/30/2020   HCT 38.7 05/30/2020   MCV 85.0 05/30/2020   PLT 337.0 05/30/2020   Lab Results  Component Value Date   CREATININE 0.94 05/30/2020   BUN 20 05/30/2020   NA 138 05/30/2020   K 4.1 05/30/2020   CL 106 05/30/2020   CO2 23 05/30/2020   Lab Results  Component Value Date   ALT 39 (H) 05/30/2020   AST 25 05/30/2020   ALKPHOS 112 05/30/2020   BILITOT 0.2 05/30/2020   Lab Results  Component Value Date   CHOL 179 03/28/2020   Lab Results  Component Value Date   HDL 55.00 03/28/2020   No results found for: [facility] Lab Results  Component Value Date   TRIG 212.0 (H) 03/28/2020   Lab Results  Component Value Date   CHOLHDL 3 03/28/2020   Lab Results  Component Value Date   CKTOTAL 104 05/30/2020   IMPRESSION AND PLAN:  1) GAD/MDD,  recurrent, mild episode in partial remission. Things are pretty stable. No changes: cont fluox 40mg  qd and alpraz 0.5mg  tid. No new rx given today. UDS today.  CSC today.  2) Hyperchol: tolerating statin. Diet discussed again, enc her to maximize physical activity---she is pretty limited secondary to her rheum condition.  3) Insomnia, mostly anxiety-related. Cont ambien 10mg  qd. CSC UTD.  UDS today.  4) Inflamm arth, seroneg: methotrexate IM q week started by rheum about 2 mo ago. She gets f/u with rheum soon, sounds like med not helping significantly yet. Labs per rheum, none done today.  An After Visit Summary was printed and given to the patient.  FOLLOW UP: Return in about 6 months (around 03/27/2021) for annual CPE (fasting).  Signed:  Crissie Sickles, MD           09/27/2020

## 2020-09-29 LAB — DRUG MONITORING, PANEL 8 WITH CONFIRMATION, URINE
6 Acetylmorphine: NEGATIVE ng/mL (ref <10)
Alcohol Metabolites: NEGATIVE ng/mL
Alphahydroxyalprazolam: 28 ng/mL — ABNORMAL HIGH (ref <25)
Alphahydroxymidazolam: NEGATIVE ng/mL (ref <50)
Alphahydroxytriazolam: NEGATIVE ng/mL (ref <50)
Aminoclonazepam: NEGATIVE ng/mL (ref <25)
Amphetamines: NEGATIVE ng/mL (ref <500)
Benzodiazepines: POSITIVE ng/mL — AB (ref <100)
Buprenorphine, Urine: NEGATIVE ng/mL (ref <5)
Cocaine Metabolite: NEGATIVE ng/mL (ref <150)
Creatinine: 18.8 mg/dL
Hydroxyethylflurazepam: NEGATIVE ng/mL (ref <50)
Lorazepam: NEGATIVE ng/mL (ref <50)
MDMA: NEGATIVE ng/mL (ref <500)
Marijuana Metabolite: NEGATIVE ng/mL (ref <20)
Nordiazepam: NEGATIVE ng/mL (ref <50)
Opiates: NEGATIVE ng/mL (ref <100)
Oxazepam: NEGATIVE ng/mL (ref <50)
Oxidant: NEGATIVE ug/mL
Oxycodone: NEGATIVE ng/mL (ref <100)
Specific Gravity: 1.003 (ref >=1.0)
Temazepam: NEGATIVE ng/mL (ref <50)
pH: 5.7 (ref 4.5–9.0)

## 2020-09-29 LAB — DM TEMPLATE

## 2020-09-30 ENCOUNTER — Other Ambulatory Visit: Payer: Self-pay

## 2020-10-09 ENCOUNTER — Telehealth: Payer: Self-pay

## 2020-10-09 NOTE — Telephone Encounter (Signed)
Pt needs to call [facility] to set this up. I have referred her to them but I have no role in determining what they do or when they do it.-thx

## 2020-10-09 NOTE — Telephone Encounter (Signed)
Last GI referral was completed on 09/03/20 for dysphagia and hx of polyps. No mention of endoscopy.   Please advise, thanks.

## 2020-10-09 NOTE — Telephone Encounter (Signed)
Requesting endoscopy and colonoscopy to be scheduled on same day before the end of year.  I did not see order for endoscopy.  Please advise.  Thank you

## 2020-10-10 ENCOUNTER — Encounter: Payer: Self-pay | Admitting: Internal Medicine

## 2020-10-10 NOTE — Telephone Encounter (Signed)
Patient was contacted by [facility] regarding both procedures and will follow up with them.

## 2020-10-14 ENCOUNTER — Encounter: Payer: Self-pay | Admitting: Emergency Medicine

## 2020-10-14 ENCOUNTER — Ambulatory Visit (INDEPENDENT_AMBULATORY_CARE_PROVIDER_SITE_OTHER): Payer: Commercial Managed Care - PPO

## 2020-10-14 ENCOUNTER — Other Ambulatory Visit: Payer: Self-pay

## 2020-10-14 ENCOUNTER — Ambulatory Visit (INDEPENDENT_AMBULATORY_CARE_PROVIDER_SITE_OTHER): Payer: Commercial Managed Care - PPO | Admitting: Emergency Medicine

## 2020-10-14 ENCOUNTER — Other Ambulatory Visit: Payer: Self-pay | Admitting: Family Medicine

## 2020-10-14 ENCOUNTER — Telehealth: Payer: Self-pay | Admitting: Emergency Medicine

## 2020-10-14 VITALS — BP 124/74 | HR 90 | Temp 97.9°F | Ht 64.0 in | Wt 220.0 lb

## 2020-10-14 DIAGNOSIS — R309 Painful micturition, unspecified: Secondary | ICD-10-CM

## 2020-10-14 DIAGNOSIS — R053 Chronic cough: Secondary | ICD-10-CM

## 2020-10-14 DIAGNOSIS — J449 Chronic obstructive pulmonary disease, unspecified: Secondary | ICD-10-CM

## 2020-10-14 DIAGNOSIS — M059 Rheumatoid arthritis with rheumatoid factor, unspecified: Secondary | ICD-10-CM | POA: Diagnosis not present

## 2020-10-14 DIAGNOSIS — M069 Rheumatoid arthritis, unspecified: Secondary | ICD-10-CM | POA: Insufficient documentation

## 2020-10-14 DIAGNOSIS — R300 Dysuria: Secondary | ICD-10-CM | POA: Insufficient documentation

## 2020-10-14 LAB — URINALYSIS, ROUTINE W REFLEX MICROSCOPIC
Bilirubin Urine: NEGATIVE
Ketones, ur: NEGATIVE
Leukocytes,Ua: NEGATIVE
Nitrite: NEGATIVE
Specific Gravity, Urine: <=1.005 — AB (ref 1.000–1.030)
Total Protein, Urine: NEGATIVE
Urine Glucose: NEGATIVE
Urobilinogen, UA: 0.2 (ref 0.0–1.0)
pH: 6 (ref 5.0–8.0)

## 2020-10-14 MED ORDER — ALBUTEROL SULFATE HFA 108 (90 BASE) MCG/ACT IN AERS: 2.0000 | INHALATION_SPRAY | Freq: Four times a day (QID) | RESPIRATORY_TRACT | 6 refills | Status: DC | PRN

## 2020-10-14 MED ORDER — BEVESPI AEROSPHERE 9-4.8 MCG/ACT IN AERO: INHALATION_SPRAY | RESPIRATORY_TRACT | 3 refills | Status: DC

## 2020-10-14 NOTE — Patient Instructions (Addendum)
We will perform urinalysis today.  Depending on the results you may benefit from an antibiotic. Plan to continue Bevespi 2 puffs twice a day. Keep albuterol available use 2 puffs if needed for shortness of breath, chest tightness, wheezing. Chest x-ray today We will hold off on sleep study at this time. Continue Protonix twice a day you have been using it Continue loratadine once daily as you have been using it COVID-19 vaccine up-to-date Flu shot up-to-date Follow with Dr. Lamonte Sakai in 12 months or sooner if you have any problems.

## 2020-10-14 NOTE — Assessment & Plan Note (Signed)
Flaring symptoms in March, treated with antibiotics and prednisone with improvement.  She needs Bevespi refill, required PA last year.  May require it again this year.  She is benefiting from the medication and I would like to continue it  Plan to continue Bevespi 2 puffs twice a day. Keep albuterol available use 2 puffs if needed for shortness of breath, chest tightness, wheezing. COVID-19 vaccine up-to-date Flu shot up-to-date Follow with Dr. Lamonte Sakai in 12 months or sooner if you have any problems.

## 2020-10-14 NOTE — Assessment & Plan Note (Signed)
Complaint flank pain and dysuria.  I will check a urinalysis.  If positive then initiate treatment.

## 2020-10-14 NOTE — Progress Notes (Signed)
   Subjective:    Patient ID: Olivia Mckee, female    DOB: [birth year], 59 y.o.   MRN: [id number]  HPI  ROV 10/14/20 --59 year old woman with moderate COPD, chronic cough, GERD, allergic rhinitis.  She follows up today, reports that she is feeling well. She does have exertional SOB with rapid walking or work. Also seems to happen if she gets flustered or frustrated. Minimal cough. C/o some L flank pain and dysuria for several days.  Responds to albuterol, uses about 2x a month. Remains on Bevespi, likes it, will need a refill and ? PA for this.  She had an acute exacerbation in March that was treated with antibiotics, steroids.  Most recent pulmonary function testing was done in 09/2017, spirometry 07/2018.  We had discussed possibly a sleep study in the past but has not been done - she does not want to do right now. She is on MTX for RA since 3 months ago. Protonix bid. Uses generic loratadine.  COVID up to date, with booster. Flu shot done.    Review of Systems  Constitutional: Negative for fever and unexpected weight change.  HENT: Negative for congestion, dental problem, ear pain, nosebleeds, postnasal drip, rhinorrhea, sinus pressure, sneezing, sore throat and trouble swallowing.   Eyes: Negative for redness and itching.  Respiratory: Positive for cough and shortness of breath. Negative for chest tightness and wheezing.   Cardiovascular: Negative for palpitations and leg swelling.  Gastrointestinal: Negative for nausea and vomiting.  Genitourinary: Positive for dysuria and flank pain.  Musculoskeletal: Negative for joint swelling.  Skin: Negative for rash.  Neurological: Negative for headaches.  Hematological: Does not bruise/bleed easily.  Psychiatric/Behavioral: Negative for dysphoric mood. The patient is not nervous/anxious.       Objective:   Physical Exam Vitals:   10/14/20 0853  BP: 124/74  Pulse: 90  Temp: 97.9 F (36.6 C)  SpO2: 99%  Weight: 220 lb (99.8 kg)  Height:  5\' 4"  (1.626 m)   Gen: Pleasant, Overweight woman, in no distress,  normal affect  ENT: No lesions,  mouth clear,  oropharynx clear, no postnasal drip  Neck: No JVD, no UA noise today  Lungs: No use of accessory muscles, no wheeze on a normal breath  Cardiovascular: RRR, heart sounds normal, no murmur or gallops, no peripheral edema  Musculoskeletal: No deformities, no cyanosis or clubbing  Neuro: alert, non focal  Skin: Warm, no lesions or rashes     Assessment & Plan:  Chronic obstructive pulmonary disease ([place]) Flaring symptoms in March, treated with antibiotics and prednisone with improvement.  She needs Bevespi refill, required PA last year.  May require it again this year.  She is benefiting from the medication and I would like to continue it  Plan to continue Bevespi 2 puffs twice a day. Keep albuterol available use 2 puffs if needed for shortness of breath, chest tightness, wheezing. COVID-19 vaccine up-to-date Flu shot up-to-date Follow with Dr. Lamonte Sakai in 12 months or sooner if you have any problems.   Rheumatoid arthritis (HCC) On methotrexate for the last 3 months.  Surveillance chest x-ray today.  She will need serial imaging to ensure no evolving sequela from either the RA or medication.  Chronic cough Continue treatment for allergic rhinitis, GERD  Dysuria Complaint flank pain and dysuria.  I will check a urinalysis.  If positive then initiate treatment.  Baltazar Apo, MD, PhD 10/14/2020, 1:30 PM [facility] [phone] or if no answer [phone]

## 2020-10-14 NOTE — Assessment & Plan Note (Signed)
On methotrexate for the last 3 months.  Surveillance chest x-ray today.  She will need serial imaging to ensure no evolving sequela from either the RA or medication.

## 2020-10-14 NOTE — Assessment & Plan Note (Signed)
Continue treatment for allergic rhinitis, GERD

## 2020-10-15 NOTE — Telephone Encounter (Signed)
Called and spoke to pt. Advised pt that the results are not available yet. Pt states she thinks instead of a kidney infection she thinks she pulled a muscle or aggravated her sciatic nerve a couple days ago.   Dr. Lamonte Sakai please advise on UA. Thanks.

## 2020-10-15 NOTE — Telephone Encounter (Signed)
Called and spoke to pt. Informed her of the results and recs per Dr. Byrum. Pt verbalized understanding and denied any further questions or concerns at this time.   

## 2020-10-15 NOTE — Telephone Encounter (Signed)
Please let her know that the UA does not show evidence for an infection. There may have been some microscopic blood seen. If her discomfort continues I want her to discuss with her PCP to see if any further workup is necessary.

## 2020-10-15 NOTE — Telephone Encounter (Signed)
Requesting: ambien 10 mg Contract: 09/27/20 UDS: 09/27/20 Last Visit: 09/27/20 Next Visit: n/a Last Refill: 03/28/20 (90,1)  Please Advise

## 2020-10-20 ENCOUNTER — Other Ambulatory Visit: Payer: Self-pay | Admitting: Family Medicine

## 2020-10-23 ENCOUNTER — Other Ambulatory Visit: Payer: Self-pay | Admitting: Family Medicine

## 2020-10-24 ENCOUNTER — Other Ambulatory Visit: Payer: Self-pay | Admitting: Family Medicine

## 2020-10-24 ENCOUNTER — Encounter: Payer: Self-pay | Admitting: Family Medicine

## 2020-10-24 NOTE — Telephone Encounter (Addendum)
RF request for Pravastatin LOV:09/27/20 Next ov: advised to f/u 6 mo. Last written: historical med  Medication pending. Please advise, thanks.

## 2020-10-29 ENCOUNTER — Encounter: Payer: Self-pay | Admitting: *Deleted

## 2020-11-05 ENCOUNTER — Ambulatory Visit: Payer: Commercial Managed Care - PPO | Admitting: Internal Medicine

## 2020-11-11 ENCOUNTER — Other Ambulatory Visit: Payer: Self-pay | Admitting: Family Medicine

## 2020-11-13 ENCOUNTER — Other Ambulatory Visit: Payer: Self-pay | Admitting: Emergency Medicine

## 2020-11-21 ENCOUNTER — Other Ambulatory Visit: Payer: Self-pay | Admitting: Primary Care

## 2020-12-16 ENCOUNTER — Other Ambulatory Visit: Payer: Self-pay | Admitting: Family Medicine

## 2020-12-17 NOTE — Telephone Encounter (Signed)
Pt made aware refill available

## 2020-12-17 NOTE — Telephone Encounter (Signed)
RF request for cyclobenzaprine LOV: 09/27/20 Next ov: advised to f/u 6 mo. Last written: 07/17/20(180,1)  Please advise, thanks. Medication pending

## 2021-01-16 ENCOUNTER — Other Ambulatory Visit: Payer: Self-pay | Admitting: Family Medicine

## 2021-02-21 ENCOUNTER — Telehealth: Payer: Self-pay | Admitting: Emergency Medicine

## 2021-02-21 NOTE — Telephone Encounter (Signed)
Called and spoke with patient, she states she does not tolerate any of the other inhalers, they all give her thrush.  The insurance company wants her to try other inhalers because the Bevespi is $1000 and she cannot afford $1000 for her inhaler.  She states she is doing very well on the [place] and wants to remain on the [place].  She states our office called the insurance company and she ended up getting it for free.  Advised the patient I would look into the matter and once we have the issue resolved we will call her back.  She verbalized understanding.  [facility]ed [facility] and spoke with Elmyra Ricks, she states it is not requiring a PA at this time, just a different product selection (Anoro or Stiolto).  Advised that she does not tolerate other inhalers, they give her terrible thrush.  I asked if I just needed to call the insurance company.  She is faxing me the rejection letter to [phone].  PA started:  PA request was received from ([facility]): [facility] Phone:  [phone] Fax: [phone] Medication name and strength: Bevespi Aerosphere [place]   Ordering Provider: Dr. Lamonte Sakai  Was PA started with [facility]?: yes  If yes, please enter KEY: [id number] Medication tried and failed:  Symbicort 160-4.5, Trelegy Ellipta 100,  Spiriva 18 mcg inhalation capsule, Stiolto, Anoro Covered Alternatives: Anoro Ellipta Aer 62.5-25 and Stiolto Aer 2.5-2.5  PA sent to plan, time frame for approval / denial: 5 business days Routing to [place] for follow-up

## 2021-02-21 NOTE — Telephone Encounter (Signed)
Checked PA status on [email or web address]. PA is still pending. I called [facility] and was advised the PA is still pending as well. Will check back later.

## 2021-02-25 ENCOUNTER — Telehealth: Payer: Self-pay | Admitting: Emergency Medicine

## 2021-02-25 MED ORDER — BEVESPI AEROSPHERE 9-4.8 MCG/ACT IN AERO: 2.0000 | INHALATION_SPRAY | Freq: Two times a day (BID) | RESPIRATORY_TRACT | 1 refills | Status: DC

## 2021-02-25 NOTE — Telephone Encounter (Signed)
Pt is calling in regards to [employer] medication for her COPD and stated that she is allergic to the other inhalers they give her thrush and she stated that she is needing for an rx to be called in to her pharmacy and for it to be taken care of because it has been an ongoing issue for ~ 2 wks now. Pls regard [phone]. Pharmacy; [id number] [place], [place] - [address]

## 2021-02-25 NOTE — Telephone Encounter (Signed)
Next Steps The plan will fax you a determination, typically within 1 to 5 business days.  PA started on 02/21/21 5 business days would be 02/28/21  Still nothing faxed over  Called and spoke with patient. She states that Dr. Lamonte Sakai calls insurance company stating she is allergic to everything. Patient is almost out of them.    [employer] company spoke with a PA representative through [facility] answered clinical questions that were holding the approval process for the medication. Patient was approved for medication for 12 months.  02/25/21-02/25/22.  PA number [id number]  We can't do this by CMM in future, we have to call [facility] and do it over the phone.  Called patient let her know of the approval. And Pharmacy test claim went through so it can be filled at pharmacy for patient today  Nothing further needed at this time.

## 2021-02-25 NOTE — Telephone Encounter (Signed)
lmtcb for pt.  

## 2021-02-25 NOTE — Telephone Encounter (Signed)
Checked PA folder, found the approval for Bevespi. Olivia Mckee has been approved 02/25/21-02/25/22. Patient is aware of this approval.   [facility] and spoke with pharmacist. Pharmacist stated that they were able to get the medication approved but for a 3 month supply, it would cost over $1000. She ran the RX through for a one month supply and the price was $391.   Left message for patient to call back.

## 2021-02-25 NOTE — Telephone Encounter (Signed)
Order was placed Pharmacy can fill

## 2021-02-26 NOTE — Telephone Encounter (Signed)
Yes ok to write that letter

## 2021-02-26 NOTE — Telephone Encounter (Signed)
Letter has been completed and sent to the pt via mychart.

## 2021-02-26 NOTE — Telephone Encounter (Signed)
Called and spoke with pt about the PA that was done and even after it being approved the cost was still $300 due to her having a high deductible.  Pt said that she called [facility] and they told her if a letter was written stating that it was medically necessary for her to remain on the [facility], they told her that they should be able to bring the copay to $0.  Dr. Lamonte Sakai, please advise if you are okay with a letter being written.

## 2021-02-27 ENCOUNTER — Telehealth: Payer: Self-pay | Admitting: Emergency Medicine

## 2021-02-27 ENCOUNTER — Encounter: Payer: Self-pay | Admitting: Family Medicine

## 2021-02-27 NOTE — Telephone Encounter (Signed)
Printed letter to fax to [employer]. Called and left message on patient's voicemail that letter has been faxed.  Nothing further needed at this time.

## 2021-03-03 NOTE — Telephone Encounter (Signed)
I called and spoke with patient. She states her insurance company needed a letter stating that she could not afford Bevespi and that it was medically necessary for her. I advised the patient that the letter that was sent over on 02/26/21 stated both things. I told patient that I will refax the letter and she states she will give them a few day to see if they received it.  Letter faxed, nothing further needed at this time.

## 2021-03-10 ENCOUNTER — Telehealth: Payer: Self-pay | Admitting: Emergency Medicine

## 2021-03-10 MED ORDER — DOXYCYCLINE HYCLATE 100 MG PO TABS: 100.0000 mg | ORAL_TABLET | Freq: Two times a day (BID) | ORAL | 0 refills | Status: AC

## 2021-03-10 MED ORDER — PREDNISONE 10 MG PO TABS: ORAL_TABLET | ORAL | 0 refills | Status: AC

## 2021-03-10 NOTE — Telephone Encounter (Signed)
Send in Doxycycline 100 mg twice daily x 7 days. Sunblock when in the sun. Probiotic or Acitvia Yogurt while on antibiotics Send Prednisone taper; 10 mg tablets: 4 tabs x 2 days, 3 tabs x 2 days, 2 tabs x 2 days 1 tab x 2 days then stop. Will need a follow up appointment in 2 weeks with whoever she sees or an APP. >> Must be seen in office as a follow up for tele prescribed meds. If she gets worse not better she needs yo seek emergency care.  See if she wants to come and get some Breztri samples.

## 2021-03-10 NOTE — Telephone Encounter (Signed)
Called and spoke with patient to go over [employer]. Patient expressed understanding and verified pharmacy for me. 2 prescriptions have been sent in for patient. She is also now scheduled for follow up OV on 5/2 at 9:30 advised patient that Judson Roch said it must be in person visit. She expressed understanding. Nothing further needed at this time.

## 2021-03-10 NOTE — Telephone Encounter (Signed)
Called and spoke with patient who states that she believes she has bronchitis and has been coughing a lot and having some SOB and needing an antibiotic and prednisone. She states that she has been fighting with her incurance to get her inhalers and went without it for awhile and was finally able to get her Bevespi on Friday it was $400. Has $3200 deductible. She states she has a deep productive cough with yellow is now green. Some shortness of breath. No fevers.   Sarah please advise as Dr. Lamonte Sakai is off this week

## 2021-03-14 ENCOUNTER — Other Ambulatory Visit: Payer: Self-pay | Admitting: Family Medicine

## 2021-03-18 ENCOUNTER — Other Ambulatory Visit: Payer: Self-pay | Admitting: Family Medicine

## 2021-03-18 NOTE — Telephone Encounter (Signed)
Requesting: alprazolam Contract: 09/27/20 UDS: 09/27/20 Last Visit: 09/27/20 Next Visit: advised to f/u May Last Refill: 09/20/20(90,5)  Please Advise. Medication pending

## 2021-03-19 NOTE — Telephone Encounter (Signed)
Pt advised refill sent. She is aware due for appt and will call back at a later time to schedule.

## 2021-03-24 ENCOUNTER — Ambulatory Visit: Payer: Commercial Managed Care - PPO | Admitting: Primary Care

## 2021-04-14 ENCOUNTER — Other Ambulatory Visit: Payer: Self-pay | Admitting: Family Medicine

## 2021-04-15 ENCOUNTER — Telehealth: Payer: Self-pay

## 2021-04-15 NOTE — Telephone Encounter (Signed)
OK I rx'd 30d supply of each med but she needs f/u for these meds before any FURTHER RF's.-thx

## 2021-04-15 NOTE — Telephone Encounter (Signed)
Spoke with pt and pt with cb and sched appt

## 2021-04-15 NOTE — Telephone Encounter (Signed)
Requesting:zolpidem Contract: n/a UDS:09/27/20 Last Visit:09/27/20 Next Visit:advised to f/u May for CPE  Last Refill: 10/15/20(90,1)  Requesting: alprazolam Contract:09/27/20 UDS:09/27/20 Last Visit:09/27/20 Next Visit: advised to f/u May for CPE Last Refill:03/19/21 (90,0)  Please Advise. Medications pending

## 2021-04-15 NOTE — Telephone Encounter (Signed)
Called pt and sent text for pt to sign up again    [facility] - Client Client Site [facility] - Day Physician Crissie Sickles - MD Contact Type Call Who Is Calling Patient / Member / Family / Caregiver Caller Name [place] Phone Number [phone] Patient Name Olivia Mckee Patient DOB Jan 24, 1961 Call Type Message Only Information Provided Reason for Call Request for General Office Information Initial Comment Caller states she is needing assistance on how to get back on the patient portal. Additional Comment Caller states she is not sure of her username on the app. Caller states she is needing to schedule a follow up appt and she wanted to make sure her prescriptions was called in to the pharmacy as well.

## 2021-04-20 ENCOUNTER — Other Ambulatory Visit: Payer: Self-pay | Admitting: Family Medicine

## 2021-04-22 ENCOUNTER — Other Ambulatory Visit: Payer: Self-pay

## 2021-04-22 DIAGNOSIS — R5382 Chronic fatigue, unspecified: Secondary | ICD-10-CM | POA: Insufficient documentation

## 2021-04-22 DIAGNOSIS — M2550 Pain in unspecified joint: Secondary | ICD-10-CM | POA: Insufficient documentation

## 2021-04-22 DIAGNOSIS — G9332 Myalgic encephalomyelitis/chronic fatigue syndrome: Secondary | ICD-10-CM | POA: Insufficient documentation

## 2021-04-23 DIAGNOSIS — D509 Iron deficiency anemia, unspecified: Secondary | ICD-10-CM

## 2021-04-23 HISTORY — DX: Iron deficiency anemia, unspecified: D50.9

## 2021-04-23 NOTE — Progress Notes (Signed)
Office Note 04/24/2021  CC:  Chief Complaint  Patient presents with  . Follow-up    RCI, pt is not fasting.   HPI:  Olivia Mckee is a 59 y.o. White female who is here for annual health maintenance exam and 7 mo f/u GAD/MDD, insomnia, HLD. 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 Things are pretty stable. No changes: cont fluox 17m qd and alpraz 0.537mtid. No new rx given today. UDS today.  CSC today.  2) Hyperchol: tolerating statin. Diet discussed again, enc her to maximize physical activity---she is pretty limited secondary to her rheum condition.  3) Insomnia, mostly anxiety-related. Cont ambien 1059md. CSC UTD.  UDS today.  4) Inflamm arth, seroneg: methotrexate IM q week started by rheum about 2 mo ago. She gets f/u with rheum soon, sounds like med not helping significantly yet. Labs per rheum, none done today."  INTERIM HX: Still on methotrexate for her RA.   COPD has been stable last 6 mo, followed up with her pulm MD about 6 mo ago. Continues to gradually gain wt.  States she doesn't eat much. Just restarted exercise in pool, just restarted nutrisystem. Says she has not had alcohol in 4 yrs.  Went to step into tub a few days ago, slipped and hit R tibia region, has noted a little ankle/foot swelling since.    Insomnia: takes amb[place]most every night, no side effects, helps well. Takes alprazolam bid-tid consistently, helps well with anxiety, although she still feels lots of chronic anxiety--mainly a response to family issues.  Also on fluox 40 qd.  PMP AWARE reviewed today: most recent rx for ambien 23m23ms filled 04/15/21, # 30, rx by me. Most recent alpraz 0.5mg 63mfilled 04/15/21, #90, rx by me. Most recent tramadol rx filled 04/15/21, #90, rx by Dr. KrameAlfonso Ramusred flags.   Past Medical History:  Diagnosis Date  . Alcoholism (HCC) [place] Allergic rhinitis   . Anxiety and depression   . Asthma   . Cervical  cancer (HCC) [place] Choledocholithiasis   . COPD (chronic obstructive pulmonary disease) (HCC) [place] Diverticulosis 2015   Sigmoid colon (noted on colonoscopy)  . Elevated LFTs   . GERD (gastroesophageal reflux disease)   . History of adenomatous polyp of colon 04/02/2014   Recall 5 yrs  . History of pyelonephritis 12/2019  . Hypercholesterolemia   . 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 Methotrexate INJ q week.  . Insomnia   . Myalgia, unspecified site    + arthralgias---she is tender EVERYWHERE (summer 2021)->rheum ref  . Osteoporosis   . Perforation of cecum    perf'd in context of SBO that followed GB surgery; no colon removed.  . Ventral hernia     Past Surgical History:  Procedure Laterality Date  . BREAST BIOPSY  2010   benign cyst  . CHOLECYSTECTOMY    . COLONOSCOPY  04/02/2014   2015 adenoma x 2->recall 5 yrs ([facility])  . Iliostomy     +  takedown  . MOUTH SURGERY    . NOSE SURGERY    . PARTIAL HYSTERECTOMY  1983  . perfor colon repair     SBO after gallbladder surgery  . SALPINGOOPHORECTOMY Bilateral 05/10/2014  . TONSILLECTOMY    . VENTRAL HERNIA REPAIR  2016   open,  with mesh    Family History  Problem Relation Age of Onset  . Diabetes Mother   . Hypertension Mother   . Hearing loss Mother   . Depression Mother   . Diabetes Father   . Hearing loss Father   . Early death Father   . Stroke Maternal Grandmother   . Kidney disease Maternal Grandmother   . Diabetes Maternal Grandmother   . Heart disease Maternal Grandmother   . Arthritis Maternal Grandmother   . Stroke Maternal Grandfather   . Prostate cancer Maternal Grandfather   . Diabetes Maternal Grandfather   . Heart disease Maternal Grandfather   . Arthritis Maternal Grandfather   . Alcohol abuse Maternal Grandfather   . Depression Maternal Grandfather   . Diabetes Paternal Grandmother   .  Alcohol abuse Paternal Grandmother   . Diabetes Paternal Grandfather   . Alcohol abuse Paternal Grandfather   . Heart disease Paternal Grandfather     Social History   Socioeconomic History  . Marital status: Married    Spouse name: Not on file  . Number of children: Not on file  . Years of education: Not on file  . Highest education level: Not on file  Occupational History  . Not on file  Tobacco Use  . Smoking status: Former Smoker    Packs/day: 1.00    Years: 25.00    Pack years: 25.00    Types: Cigarettes    Quit date: 11/23/2010    Years since quitting: 10.4  . Smokeless tobacco: Never Used  Substance and Sexual Activity  . Alcohol use: No  . Drug use: No  . Sexual activity: Not on file  Other Topics Concern  . Not on file  Social History Narrative   Married, 1 daughter, several grandkids (2 of whom pt is raising).   Educ: some college   Occup: retired Development worker, [employer].   Tob: 20 pack-yr hx, quit approx 2005.   Alc: hx of alcoholism, quit 2018.   No hx of drug abuse.      Social Determinants of Health   Financial Resource Strain: Not on file  Food Insecurity: Not on file  Transportation Needs: Not on file  Physical Activity: Not on file  Stress: Not on file  Social Connections: Not on file  Intimate Partner Violence: Not on file    Outpatient Medications Prior to Visit  Medication Sig Dispense Refill  . albuterol (VENTOLIN HFA) 108 (90 Base) MCG/ACT inhaler Inhale 2 puffs into the lungs every 6 (six) hours as needed for wheezing or shortness of breath. 8 g 6  . B-D TB SYRINGE 1CC/27GX1/2" 27G X 1/2" 1 ML MISC Inject into the skin.    . cyclobenzaprine (FLEXERIL) 10 MG tablet TAKE ONE TABLET (10 MG DOSE) BY MOUTH 2 (TWO) TIMES A DAY AS NEEDED FOR MUSCLE SPASMS. 180 tablet 1  . Glycopyrrolate-Formoterol (BEVESPI AEROSPHERE) 9-4.8 MCG/ACT AERO Take 2 puffs by mouth in the morning and at bedtime. 32.1 each 1  . leucovorin (WELLCOVORIN) 5 MG tablet Take 5 mg by mouth  daily.    Marland Kitchen LOMAIRA 8 MG TABS Take 1 tablet by mouth 2 (two) times daily.    . Methotrexate Sodium (METHOTREXATE, PF,) 50 MG/2ML injection 7 mL    . pantoprazole (PROTONIX) 40 MG tablet TAKE 1 TABLET BY MOUTH TWICE A DAY 180 tablet 0  . pravastatin (PRAVACHOL) 10 MG tablet TAKE 1 TABLET BY MOUTH EVERY DAY 30 tablet 0  . traMADol (ULTRAM) 50 MG tablet  Take 50 mg by mouth 4 (four) times daily as needed.    . valACYclovir (VALTREX) 500 MG tablet TAKE 1 TABLET BY MOUTH THEN TAKE 1 TABLET 12 HOURS LATER    . ALPRAZolam (XANAX) 0.5 MG tablet TAKE 1 TABLET BY MOUTH THREE TIMES A DAY AS NEEDED 90 tablet 0  . FLUoxetine (PROZAC) 40 MG capsule Take 1 capsule (40 mg total) by mouth daily. 90 capsule 3  . zolpidem (AMBIEN) 10 MG tablet TAKE 1 TABLET BY MOUTH AT BEDTIME AS NEEDED. 30 tablet 0  . benzonatate (TESSALON) 200 MG capsule Take 1 capsule (200 mg total) by mouth 3 (three) times daily as needed for cough. (Patient not taking: Reported on 04/24/2021) 30 capsule 0  . folic acid (FOLVITE) 1 MG tablet Take 1 mg by mouth daily. (Patient not taking: Reported on 04/24/2021)    . meloxicam (MOBIC) 15 MG tablet Take 15 mg by mouth daily. (Patient not taking: Reported on 04/24/2021)    . topiramate (TOPAMAX) 50 MG tablet Take 1/2 tablet po daily for one week and then increase to a full pill (Patient not taking: No sig reported)     No facility-administered medications prior to visit.    Allergies  Allergen Reactions  . Penicillins Anaphylaxis and Rash  . Sulfa Antibiotics Anaphylaxis and Rash    ROS Review of Systems  Constitutional: Negative for appetite change, chills, fatigue and fever.  HENT: Negative for congestion, dental problem, ear pain and sore throat.   Eyes: Negative for discharge, redness and visual disturbance.  Respiratory: Negative for cough, chest tightness, shortness of breath and wheezing.   Cardiovascular: Negative for chest pain, palpitations and leg swelling.  Gastrointestinal:  Negative for abdominal pain, blood in stool, diarrhea, nausea and vomiting.  Genitourinary: Negative for difficulty urinating, dysuria, flank pain, frequency, hematuria and urgency.  Musculoskeletal: Negative for arthralgias, back pain, joint swelling, myalgias and neck stiffness.  Skin: Negative for pallor and rash.  Neurological: Negative for dizziness, speech difficulty, weakness and headaches.  Hematological: Negative for adenopathy. Does not bruise/bleed easily.  Psychiatric/Behavioral: Negative for confusion and sleep disturbance. The patient is not nervous/anxious.     PE; Vitals with BMI 04/24/2021 10/14/2020 09/27/2020  Height _0  _1  5' 4.5"  Weight 239 lbs 6 oz 220 lbs 223 lbs 13 oz  BMI 41.07 29.79 89.21  Systolic 95 194 99  Diastolic 59 74 62  Pulse 89 90 85   Exam chaperoned by Deveron Furlong, CMA. Gen: Alert, well appearing.  Patient is oriented to person, place, time, and situation. AFFECT: pleasant, lucid thought and speech. ENT: Ears: EACs clear, normal epithelium.  TMs with good light reflex and landmarks bilaterally.  Eyes: no injection, icteris, swelling, or exudate.  EOMI, PERRLA. Nose: no drainage or turbinate edema/swelling.  No injection or focal lesion.  Mouth: lips without lesion/swelling.  Oral mucosa pink and moist.  Dentition intact and without obvious caries or gingival swelling.  Oropharynx without erythema, exudate, or swelling.  Neck: supple/nontender.  No LAD, mass, or TM.  Carotid pulses 2+ bilaterally, without bruits. CV: RRR, no m/r/g.   LUNGS: CTA bilat, nonlabored resps, good aeration in all lung fields. ABD: soft, NT, ND, BS normal.  No hepatospenomegaly or mass.  No bruits. EXT: no clubbing or cyanosis.  She has 1+ pitting edema in inferior 1/2 of LL on R.  Focal TTP and firm focal subQ nodularity in anteromedial R LL---no skin bruising.  No calf tenderness, homans neg.  Musculoskeletal: no  joint swelling, erythema, warmth, or tenderness.  ROM  of all joints intact. Skin - no sores or suspicious lesions or rashes or color changes    Pertinent labs:  Lab Results  Component Value Date   TSH 1.71 03/28/2020   Lab Results  Component Value Date   WBC 8.0 05/30/2020   HGB 12.8 05/30/2020   HCT 38.7 05/30/2020   MCV 85.0 05/30/2020   PLT 337.0 05/30/2020   Lab Results  Component Value Date   CREATININE 0.94 05/30/2020   BUN 20 05/30/2020   NA 138 05/30/2020   K 4.1 05/30/2020   CL 106 05/30/2020   CO2 23 05/30/2020   Lab Results  Component Value Date   ALT 39 (H) 05/30/2020   AST 25 05/30/2020   ALKPHOS 112 05/30/2020   BILITOT 0.2 05/30/2020   Lab Results  Component Value Date   CHOL 179 03/28/2020   Lab Results  Component Value Date   HDL 55.00 03/28/2020   No results found for: [facility] Lab Results  Component Value Date   TRIG 212.0 (H) 03/28/2020   Lab Results  Component Value Date   CHOLHDL 3 03/28/2020    ASSESSMENT AND PLAN:   1) MDD/GAD: doing fairly well overall on fluoxetine 27m qd and xanax 0.571mtid prn. CSC UTD.  New rx for alpraz 0.46m30mid prn, #90, RF x 5.  2) INsomnia: doing well on ambien 41m29ms. CSC UTD. New rx for ambien 41mg106mqhs prn, #30, RF x 5.  3) HLD: tolerating pravastatin 41mg 19mNot fasting today.  4) R LL hematoma s/p recent contusion. Reassured, discussed ice, rest, tylenol prn.  5) Health maintenance exam: Reviewed age and gender appropriate health maintenance issues (prudent diet, regular exercise, health risks of tobacco and excessive alcohol, use of seatbelts, fire alarms in home, use of sunscreen).  Also reviewed age and gender appropriate health screening as well as vaccine recommendations. Vaccines:  Prevnar 20->given today.  Otherwise all UTD. Labs: fasting HP labs ordered--she'll return for future fasting lab appt. Cervical ca screening: hx of cervical ca, partial hysterectomy-->refer to GYN locally. Breast ca screening: no mammograms in EMR->pt  states she had most recent last spring in [place].  She'll arrange rpt soon. Colon ca screening:  Hx of adenomatous polyps, recall was 03/2019 ([facility])->she won't go back to them and no other GI will accept her.  An After Visit Summary was printed and given to the patient.  FOLLOW UP:  Return in about 6 months (around 10/24/2021) for routine chronic illness f/u.  Signed:  Phil MCrissie Sickles         04/24/2021

## 2021-04-24 ENCOUNTER — Encounter: Payer: Self-pay | Admitting: Family Medicine

## 2021-04-24 ENCOUNTER — Ambulatory Visit (INDEPENDENT_AMBULATORY_CARE_PROVIDER_SITE_OTHER): Payer: Commercial Managed Care - PPO | Admitting: Family Medicine

## 2021-04-24 ENCOUNTER — Other Ambulatory Visit: Payer: Self-pay

## 2021-04-24 VITALS — BP 95/59 | HR 89 | Temp 97.5°F | Resp 16 | Ht 64.0 in | Wt 239.4 lb

## 2021-04-24 DIAGNOSIS — Z1231 Encounter for screening mammogram for malignant neoplasm of breast: Secondary | ICD-10-CM

## 2021-04-24 DIAGNOSIS — F5104 Psychophysiologic insomnia: Secondary | ICD-10-CM

## 2021-04-24 DIAGNOSIS — Z23 Encounter for immunization: Secondary | ICD-10-CM | POA: Diagnosis not present

## 2021-04-24 DIAGNOSIS — E7800 Pure hypercholesterolemia, unspecified: Secondary | ICD-10-CM | POA: Diagnosis not present

## 2021-04-24 DIAGNOSIS — Z0000 Encounter for general adult medical examination without abnormal findings: Secondary | ICD-10-CM

## 2021-04-24 DIAGNOSIS — F339 Major depressive disorder, recurrent, unspecified: Secondary | ICD-10-CM | POA: Diagnosis not present

## 2021-04-24 DIAGNOSIS — F411 Generalized anxiety disorder: Secondary | ICD-10-CM

## 2021-04-24 DIAGNOSIS — Z8541 Personal history of malignant neoplasm of cervix uteri: Secondary | ICD-10-CM

## 2021-04-24 DIAGNOSIS — Z90711 Acquired absence of uterus with remaining cervical stump: Secondary | ICD-10-CM

## 2021-04-24 DIAGNOSIS — Z0001 Encounter for general adult medical examination with abnormal findings: Secondary | ICD-10-CM | POA: Diagnosis not present

## 2021-04-24 DIAGNOSIS — S8011XA Contusion of right lower leg, initial encounter: Secondary | ICD-10-CM

## 2021-04-24 MED ORDER — ZOLPIDEM TARTRATE 10 MG PO TABS: 10.0000 mg | ORAL_TABLET | Freq: Every evening | ORAL | 5 refills | Status: DC | PRN

## 2021-04-24 MED ORDER — FLUOXETINE HCL 40 MG PO CAPS: 40.0000 mg | ORAL_CAPSULE | Freq: Every day | ORAL | 3 refills | Status: DC

## 2021-04-24 MED ORDER — ALPRAZOLAM 0.5 MG PO TABS: 0.5000 mg | ORAL_TABLET | Freq: Three times a day (TID) | ORAL | 5 refills | Status: DC | PRN

## 2021-04-24 NOTE — Addendum Note (Signed)
Addended by: Deveron Furlong D on: 04/24/2021 10:47 AM   Modules accepted: Orders

## 2021-04-24 NOTE — Patient Instructions (Signed)
Health Maintenance, Female Adopting a healthy lifestyle and getting preventive care are important in promoting health and wellness. Ask your health care provider about:  The right schedule for you to have regular tests and exams.  Things you can do on your own to prevent diseases and keep yourself healthy. What should I know about diet, weight, and exercise? Eat a healthy diet  Eat a diet that includes plenty of vegetables, fruits, low-fat dairy products, and lean protein.  Do not eat a lot of foods that are high in solid fats, added sugars, or sodium.   Maintain a healthy weight Body mass index (BMI) is used to identify weight problems. It estimates body fat based on height and weight. Your health care provider can help determine your BMI and help you achieve or maintain a healthy weight. Get regular exercise Get regular exercise. This is one of the most important things you can do for your health. Most adults should:  Exercise for at least 150 minutes each week. The exercise should increase your heart rate and make you sweat (moderate-intensity exercise).  Do strengthening exercises at least twice a week. This is in addition to the moderate-intensity exercise.  Spend less time sitting. Even light physical activity can be beneficial. Watch cholesterol and blood lipids Have your blood tested for lipids and cholesterol at 60 years of age, then have this test every 5 years. Have your cholesterol levels checked more often if:  Your lipid or cholesterol levels are high.  You are older than 60 years of age.  You are at high risk for heart disease. What should I know about cancer screening? Depending on your health history and family history, you may need to have cancer screening at various ages. This may include screening for:  Breast cancer.  Cervical cancer.  Colorectal cancer.  Skin cancer.  Lung cancer. What should I know about heart disease, diabetes, and high blood  pressure? Blood pressure and heart disease  High blood pressure causes heart disease and increases the risk of stroke. This is more likely to develop in people who have high blood pressure readings, are of African descent, or are overweight.  Have your blood pressure checked: ? Every 3-5 years if you are 18-39 years of age. ? Every year if you are 40 years old or older. Diabetes Have regular diabetes screenings. This checks your fasting blood sugar level. Have the screening done:  Once every three years after age 40 if you are at a normal weight and have a low risk for diabetes.  More often and at a younger age if you are overweight or have a high risk for diabetes. What should I know about preventing infection? Hepatitis B If you have a higher risk for hepatitis B, you should be screened for this virus. Talk with your health care provider to find out if you are at risk for hepatitis B infection. Hepatitis C Testing is recommended for:  Everyone born from 1945 through 1965.  Anyone with known risk factors for hepatitis C. Sexually transmitted infections (STIs)  Get screened for STIs, including gonorrhea and chlamydia, if: ? You are sexually active and are younger than 60 years of age. ? You are older than 60 years of age and your health care provider tells you that you are at risk for this type of infection. ? Your sexual activity has changed since you were last screened, and you are at increased risk for chlamydia or gonorrhea. Ask your health care provider   if you are at risk.  Ask your health care provider about whether you are at high risk for HIV. Your health care provider may recommend a prescription medicine to help prevent HIV infection. If you choose to take medicine to prevent HIV, you should first get tested for HIV. You should then be tested every 3 months for as long as you are taking the medicine. Pregnancy  If you are about to stop having your period (premenopausal) and  you may become pregnant, seek counseling before you get pregnant.  Take 400 to 800 micrograms (mcg) of folic acid every day if you become pregnant.  Ask for birth control (contraception) if you want to prevent pregnancy. Osteoporosis and menopause Osteoporosis is a disease in which the bones lose minerals and strength with aging. This can result in bone fractures. If you are 65 years old or older, or if you are at risk for osteoporosis and fractures, ask your health care provider if you should:  Be screened for bone loss.  Take a calcium or vitamin D supplement to lower your risk of fractures.  Be given hormone replacement therapy (HRT) to treat symptoms of menopause. Follow these instructions at home: Lifestyle  Do not use any products that contain nicotine or tobacco, such as cigarettes, e-cigarettes, and chewing tobacco. If you need help quitting, ask your health care provider.  Do not use street drugs.  Do not share needles.  Ask your health care provider for help if you need support or information about quitting drugs. Alcohol use  Do not drink alcohol if: ? Your health care provider tells you not to drink. ? You are pregnant, may be pregnant, or are planning to become pregnant.  If you drink alcohol: ? Limit how much you use to 0-1 drink a day. ? Limit intake if you are breastfeeding.  Be aware of how much alcohol is in your drink. In the [place] one drink equals one 12 oz bottle of beer (355 mL), one 5 oz glass of wine (148 mL), or one 1 oz glass of hard liquor (44 mL). General instructions  Schedule regular health, dental, and eye exams.  Stay current with your vaccines.  Tell your health care provider if: ? You often feel depressed. ? You have ever been abused or do not feel safe at home. Summary  Adopting a healthy lifestyle and getting preventive care are important in promoting health and wellness.  Follow your health care provider's instructions about healthy  diet, exercising, and getting tested or screened for diseases.  Follow your health care provider's instructions on monitoring your cholesterol and blood pressure. This information is not intended to replace advice given to you by your health care provider. Make sure you discuss any questions you have with your health care provider. Document Revised: 11/02/2018 Document Reviewed: 11/02/2018 Elsevier Patient Education  2021 Elsevier Inc.  

## 2021-04-25 ENCOUNTER — Ambulatory Visit (INDEPENDENT_AMBULATORY_CARE_PROVIDER_SITE_OTHER): Payer: Commercial Managed Care - PPO

## 2021-04-25 DIAGNOSIS — E7800 Pure hypercholesterolemia, unspecified: Secondary | ICD-10-CM

## 2021-04-25 LAB — CBC WITH DIFFERENTIAL/PLATELET
Basophils Absolute: 0 10*3/uL (ref 0.0–0.1)
Basophils Relative: 0.8 % (ref 0.0–3.0)
Eosinophils Absolute: 0.2 10*3/uL (ref 0.0–0.7)
Eosinophils Relative: 3.7 % (ref 0.0–5.0)
HCT: 36.8 % (ref 36.0–46.0)
Hemoglobin: 11.8 g/dL — ABNORMAL LOW (ref 12.0–15.0)
Lymphocytes Relative: 20.9 % (ref 12.0–46.0)
Lymphs Abs: 1.3 10*3/uL (ref 0.7–4.0)
MCHC: 32.1 g/dL (ref 30.0–36.0)
MCV: 87.1 fl (ref 78.0–100.0)
Monocytes Absolute: 0.4 10*3/uL (ref 0.1–1.0)
Monocytes Relative: 6.4 % (ref 3.0–12.0)
Neutro Abs: 4.2 10*3/uL (ref 1.4–7.7)
Neutrophils Relative %: 68.2 % (ref 43.0–77.0)
Platelets: 289 10*3/uL (ref 150.0–400.0)
RBC: 4.23 Mil/uL (ref 3.87–5.11)
RDW: 19.2 % — ABNORMAL HIGH (ref 11.5–15.5)
WBC: 6.1 10*3/uL (ref 4.0–10.5)

## 2021-04-25 LAB — TSH: TSH: 2.05 u[IU]/mL (ref 0.35–4.50)

## 2021-04-28 LAB — COMPREHENSIVE METABOLIC PANEL
ALT: 35 U/L (ref 0–35)
AST: 30 U/L (ref 0–37)
Albumin: 3.9 g/dL (ref 3.5–5.2)
Alkaline Phosphatase: 90 U/L (ref 39–117)
BUN: 14 mg/dL (ref 6–23)
CO2: 23 mEq/L (ref 19–32)
Calcium: 9.3 mg/dL (ref 8.4–10.5)
Chloride: 101 mEq/L (ref 96–112)
Creatinine, Ser: 0.87 mg/dL (ref 0.40–1.20)
GFR: 72.44 mL/min (ref >60.00)
Glucose, Bld: 97 mg/dL (ref 70–99)
Potassium: 4.1 mEq/L (ref 3.5–5.1)
Sodium: 137 mEq/L (ref 135–145)
Total Bilirubin: 0.3 mg/dL (ref 0.2–1.2)
Total Protein: 6.6 g/dL (ref 6.0–8.3)

## 2021-04-28 LAB — LIPID PANEL
Cholesterol: 184 mg/dL (ref 0–200)
HDL: 53.9 mg/dL (ref >39.00)
NonHDL: 130.58
Total CHOL/HDL Ratio: 3
Triglycerides: 248 mg/dL — ABNORMAL HIGH (ref 0.0–149.0)
VLDL: 49.6 mg/dL — ABNORMAL HIGH (ref 0.0–40.0)

## 2021-04-28 LAB — LDL CHOLESTEROL, DIRECT: Direct LDL: 100 mg/dL

## 2021-04-29 ENCOUNTER — Other Ambulatory Visit: Payer: Commercial Managed Care - PPO

## 2021-04-29 DIAGNOSIS — D649 Anemia, unspecified: Secondary | ICD-10-CM

## 2021-04-30 ENCOUNTER — Other Ambulatory Visit: Payer: Self-pay

## 2021-04-30 ENCOUNTER — Encounter: Payer: Self-pay | Admitting: Family Medicine

## 2021-04-30 DIAGNOSIS — D508 Other iron deficiency anemias: Secondary | ICD-10-CM

## 2021-04-30 LAB — IRON,TIBC AND FERRITIN PANEL
%SAT: 21 % (calc) (ref 16–45)
Ferritin: 11 ng/mL — ABNORMAL LOW (ref 16–232)
Iron: 96 ug/dL (ref 45–160)
TIBC: 461 mcg/dL (calc) — ABNORMAL HIGH (ref 250–450)

## 2021-05-06 ENCOUNTER — Encounter: Payer: Self-pay | Admitting: Family Medicine

## 2021-05-06 ENCOUNTER — Ambulatory Visit (INDEPENDENT_AMBULATORY_CARE_PROVIDER_SITE_OTHER): Payer: Commercial Managed Care - PPO

## 2021-05-06 DIAGNOSIS — D508 Other iron deficiency anemias: Secondary | ICD-10-CM

## 2021-05-06 NOTE — Progress Notes (Signed)
Pt dropped off hemoccult cards

## 2021-05-08 ENCOUNTER — Encounter: Payer: Self-pay | Admitting: Family Medicine

## 2021-05-08 ENCOUNTER — Other Ambulatory Visit: Payer: Commercial Managed Care - PPO

## 2021-05-08 LAB — HEMOCCULT SLIDES (X 3 CARDS)
Fecal Occult Blood: NEGATIVE
OCCULT 1: NEGATIVE
OCCULT 2: NEGATIVE
OCCULT 3: NEGATIVE
OCCULT 4: NEGATIVE
OCCULT 5: NEGATIVE

## 2021-05-12 ENCOUNTER — Other Ambulatory Visit: Payer: Self-pay | Admitting: Family Medicine

## 2021-05-12 NOTE — Telephone Encounter (Signed)
Please refuse Rx  Last refill done 04/24/21  Ambien 30,5 Xanax 90,5

## 2021-05-15 ENCOUNTER — Other Ambulatory Visit: Payer: Self-pay | Admitting: Family Medicine

## 2021-05-15 ENCOUNTER — Encounter: Payer: Self-pay | Admitting: Family Medicine

## 2021-05-15 NOTE — Telephone Encounter (Signed)
Please declined rx. Rx just sent for 6 mos supply

## 2021-05-16 ENCOUNTER — Other Ambulatory Visit: Payer: Self-pay

## 2021-05-16 MED ORDER — CYCLOBENZAPRINE HCL 10 MG PO TABS: ORAL_TABLET | ORAL | 1 refills | Status: DC

## 2021-05-16 MED ORDER — PANTOPRAZOLE SODIUM 40 MG PO TBEC: 1.0000 | DELAYED_RELEASE_TABLET | Freq: Two times a day (BID) | ORAL | 3 refills | Status: DC

## 2021-05-16 NOTE — Telephone Encounter (Signed)
I'll RF pantoprazole and cyclobenzaprine. I do not rx tramadol for this pt.  She gets this rx'd by Dr. Berle Mull so she needs to submit RF request to him.

## 2021-05-16 NOTE — Telephone Encounter (Signed)
RF request sent to PCP; see telephone encounter from 6/24.

## 2021-05-16 NOTE — Telephone Encounter (Signed)
LM for pt to return call regarding refills.  

## 2021-05-16 NOTE — Telephone Encounter (Signed)
Patient refill request  zolpidem (AMBIEN) 10 MG tablet [[id number]]    [id number] - [place], [place]

## 2021-05-16 NOTE — Telephone Encounter (Signed)
Pt made aware

## 2021-05-16 NOTE — Telephone Encounter (Signed)
Pt is requesting refills for multiple medications;  Requesting: tramadol Contract: n/a UDS:n/a Last Visit:04/24/21 Next Visit:06/03/21 Last Refill: 02/05/20, historical provider  RF request for pantoprazole LOV:04/24/21 Next ov: 06/03/21 Last written: 11/25/20(180,0) Dr.Byrum  RF request for flexeril LOV: 04/24/21 Next ov: 06/03/21 Last written: 12/17/20(180,1)   All meds pending. Please Advise. Spoke with Merrilee Seashore, pharmacist at [facility] to confirm they have received refills sent on 6/2 for alprazolam, zolpidem and fluoxetine.

## 2021-05-20 ENCOUNTER — Encounter: Payer: Self-pay | Admitting: Family Medicine

## 2021-05-20 NOTE — Telephone Encounter (Signed)
During last o/, her bp was 95/59 and is currently taking zolpidem 10mg . Please Advise

## 2021-05-20 NOTE — Telephone Encounter (Signed)
Pls advise pt: Cut ambien tab in half at bedtime. Do not take alprazolam any later than 4 PM. Do not take tramadol any later than 4 PM. Minimize alprazolam and tramadol as much as possible during the daytime as well b/c the main side effect from these meds is sleepiness.

## 2021-05-20 NOTE — Telephone Encounter (Signed)
FYI  Please see below

## 2021-05-21 ENCOUNTER — Other Ambulatory Visit: Payer: Self-pay

## 2021-05-21 DIAGNOSIS — G4719 Other hypersomnia: Secondary | ICD-10-CM

## 2021-05-21 NOTE — Telephone Encounter (Signed)
Her blood pressure being low would not be making her sleepy (possibly fatigued, but not sleepy).  The most common reason for excessive daytime sleepiness like this is obstructive sleep apnea. I recommend she see a sleep specialist to be evaluated for this. Pls refer to [facility] for dx of excessive daytime sleepiness. Tell her to expect the appt to likely be in 4-8 wks but will be worth the wait.-thx

## 2021-05-27 ENCOUNTER — Other Ambulatory Visit: Payer: Self-pay | Admitting: Family Medicine

## 2021-06-03 ENCOUNTER — Other Ambulatory Visit: Payer: Self-pay

## 2021-06-03 ENCOUNTER — Ambulatory Visit (INDEPENDENT_AMBULATORY_CARE_PROVIDER_SITE_OTHER): Payer: Commercial Managed Care - PPO | Admitting: Family Medicine

## 2021-06-03 ENCOUNTER — Encounter: Payer: Self-pay | Admitting: Family Medicine

## 2021-06-03 VITALS — BP 96/61 | HR 71 | Temp 97.5°F | Resp 16 | Ht 64.0 in | Wt 232.2 lb

## 2021-06-03 DIAGNOSIS — F341 Dysthymic disorder: Secondary | ICD-10-CM

## 2021-06-03 DIAGNOSIS — Z79899 Other long term (current) drug therapy: Secondary | ICD-10-CM | POA: Diagnosis not present

## 2021-06-03 DIAGNOSIS — G4719 Other hypersomnia: Secondary | ICD-10-CM

## 2021-06-03 DIAGNOSIS — F411 Generalized anxiety disorder: Secondary | ICD-10-CM | POA: Diagnosis not present

## 2021-06-03 DIAGNOSIS — D509 Iron deficiency anemia, unspecified: Secondary | ICD-10-CM

## 2021-06-03 NOTE — Progress Notes (Signed)
OFFICE VISIT  06/03/2021  CC:  Chief Complaint  Patient presents with   Follow-up    RCI,    HPI:    Patient is a 60 y.o. Caucasian female who presents for 6 wk f/u IDA, also discuss recent excessive daytime sleepiness. A/P as of last visit: "1) MDD/GAD: doing fairly well overall on fluoxetine 26m qd and xanax 0.568mtid prn. CSC UTD.  New rx for alpraz 0.59m56mid prn, #90, RF x 5.   2) INsomnia: doing well on ambien 86m7ms. CSC UTD. New rx for ambien 86mg20mqhs prn, #30, RF x 5.   3) HLD: tolerating pravastatin 86mg 31mNot fasting today.   4) R LL hematoma s/p recent contusion. Reassured, discussed ice, rest, tylenol prn.   5) Health maintenance exam: Reviewed age and gender appropriate health maintenance issues (prudent diet, regular exercise, health risks of tobacco and excessive alcohol, use of seatbelts, fire alarms in home, use of sunscreen).  Also reviewed age and gender appropriate health screening as well as vaccine recommendations. Vaccines:  Prevnar 20->given today.  Otherwise all UTD. Labs: fasting HP labs ordered--she'll return for future fasting lab appt. Cervical ca screening: hx of cervical ca, partial hysterectomy-->refer to GYN locally. Breast ca screening: no mammograms in EMR->pt states she had most recent last spring in [place].  She'll arrange rpt soon. Colon ca screening:  Hx of adenomatous polyps, recall was 03/2019 ([facility])->she won't go back to them and no other GI will accept her."  INTERIM HX: Generally not feeling well emotionally, chronic severe worry, mood down most days, poor energy level.  Taking care of grandkids who are somewhat difficult. Says RA under control except some chronic bilat shoulder pain and chronic L knee pain unrelated to RA. Taking prozac 40mg q9mngterm and alpraz 0.5 tid, ambien 86mg qh97mays she is too frustrated with wt loss clinic to continue going there.  She has shame regarding her wt/body appearance and this  affects her relationship/intimacy with husband. PHQ 9 today is 14, mild/mod diff. She says all of the above is pretty much the way she has been long term.  No SI or HI.    Iron def anemia: Labs 6 wks ago showed Hb 11.8, MCV 87, iron low-->ferritin 11, TIBC elevated at 461.  Started oral iron and checked hemoccults with were NEG X 3.  She called office 05/20/21 c/o excessive daytime sleepiness, I recommended ref to neurol/sleep medicine for possible dx of OSA.  Discussed minimization of her meds with sedative side effects (alpraz, tramadol, ambien).  She scheduled an appt with sleep medicine.  She endorses chronic snoring, reports no witnessed apneic events.  Says she has hx of nasal sinus surgery in remote past and has snored ever since.  Says primary timeframe of sleepiness is 8-12 AM, much better after that and goes to bed 10 pm nightly (ambien 86mg qhs51md says she sleeps well.  She had labs done by rheum 05/27/21->CBC nl (Hb 13.0), mcv 90, cmp all normal except mild inc ALT (38).    ROS as above, plus--> no fevers, no CP, no SOB, no wheezing, no cough, no dizziness, no HAs, no rashes, no melena/hematochezia.  No polyuria or polydipsia.  No myalgias.  No focal weakness, paresthesias, or tremors.  No acute vision or hearing abnormalities.  No dysuria or unusual/new urinary urgency or frequency.  No recent changes in lower legs. No n/v/d or abd pain.  No palpitations.    Past Medical  History:  Diagnosis Date   Alcoholism ([place])    Allergic rhinitis    Anxiety and depression    Asthma    Cervical cancer ([place])    Choledocholithiasis    COPD (chronic obstructive pulmonary disease) ([place])    Diverticulosis 2015   Sigmoid colon (noted on colonoscopy)   Elevated LFTs    GERD (gastroesophageal reflux disease)    History of adenomatous polyp of colon 04/02/2014   Recall 5 yrs   History of pyelonephritis 12/2019   Hypercholesterolemia    Inflammatory arthritis    +mild elev ESR, Rheum eval 07/2020:  rapid resp to prednisone, felt to have inflamm arth + OA and CCPD bilat knees. Methotrexate INJ q week.   Insomnia    Iron deficiency anemia 04/2021   started iron, hemoccults x 3 NEG   Myalgia, unspecified site    + arthralgias---she is tender EVERYWHERE (summer 2021)->rheum ref   Osteoporosis    Perforation of cecum    perf'd in context of SBO that followed GB surgery; no colon removed.   Ventral hernia     Past Surgical History:  Procedure Laterality Date   BREAST BIOPSY  2010   benign cyst   CHOLECYSTECTOMY     COLONOSCOPY  04/02/2014   2015 adenoma x 2->recall 5 yrs ([facility])   Iliostomy     +  takedown   MOUTH SURGERY     NOSE SURGERY     PARTIAL HYSTERECTOMY  1983   perfor colon repair     SBO after gallbladder surgery   SALPINGOOPHORECTOMY Bilateral 05/10/2014   TONSILLECTOMY     VENTRAL HERNIA REPAIR  2016   open, with mesh    Outpatient Medications Prior to Visit  Medication Sig Dispense Refill   ALPRAZolam (XANAX) 0.5 MG tablet Take 1 tablet (0.5 mg total) by mouth 3 (three) times daily as needed. 90 tablet 5   B-D TB SYRINGE 1CC/27GX1/2" 27G X 1/2" 1 ML MISC Inject into the skin.     cyclobenzaprine (FLEXERIL) 10 MG tablet TAKE ONE TABLET (10 MG DOSE) BY MOUTH 2 (TWO) TIMES A DAY AS NEEDED FOR MUSCLE SPASMS. 180 tablet 1   FERROUS SULFATE PO Take 325 mg by mouth daily.     FLUoxetine (PROZAC) 40 MG capsule Take 1 capsule (40 mg total) by mouth daily. 90 capsule 3   Glycopyrrolate-Formoterol (BEVESPI AEROSPHERE) 9-4.8 MCG/ACT AERO Take 2 puffs by mouth in the morning and at bedtime. 32.1 each 1   leucovorin (WELLCOVORIN) 5 MG tablet Take 5 mg by mouth daily.     meloxicam (MOBIC) 15 MG tablet Take 15 mg by mouth daily.     Methotrexate Sodium (METHOTREXATE, PF,) 50 MG/2ML injection 7 mL     pantoprazole (PROTONIX) 40 MG tablet Take 1 tablet (40 mg total) by mouth 2 (two) times daily. 180 tablet 3   pravastatin (PRAVACHOL) 10 MG tablet TAKE 1 TABLET BY MOUTH  EVERY DAY 90 tablet 1   traMADol (ULTRAM) 50 MG tablet Take 50 mg by mouth 4 (four) times daily as needed.     valACYclovir (VALTREX) 500 MG tablet TAKE 1 TABLET BY MOUTH THEN TAKE 1 TABLET 12 HOURS LATER     zolpidem (AMBIEN) 10 MG tablet Take 1 tablet (10 mg total) by mouth at bedtime as needed. 30 tablet 5   albuterol (VENTOLIN HFA) 108 (90 Base) MCG/ACT inhaler Inhale 2 puffs into the lungs every 6 (six) hours as needed for wheezing or shortness of  breath. (Patient not taking: Reported on 06/03/2021) 8 g 6   LOMAIRA 8 MG TABS Take 1 tablet by mouth 2 (two) times daily. (Patient not taking: Reported on 06/03/2021)     No facility-administered medications prior to visit.    Allergies  Allergen Reactions   Penicillins Anaphylaxis and Rash   Sulfa Antibiotics Anaphylaxis and Rash    ROS As per HPI  PE: Vitals with BMI 06/03/2021 04/24/2021 10/14/2020  Height 5' 4"  5' 4"  5' 4"   Weight 232 lbs 3 oz 239 lbs 6 oz 220 lbs  BMI 39.84 25.05 39.76  Systolic 96 95 734  Diastolic 61 59 74  Pulse 71 89 90   Gen: Alert, well appearing.  Patient is oriented to person, place, time, and situation. AFFECT: pleasant, lucid thought and speech. No further exam today.  LABS:  Lab Results  Component Value Date   TSH 2.05 04/25/2021   Lab Results  Component Value Date   WBC 6.1 04/25/2021   HGB 11.8 (L) 04/25/2021   HCT 36.8 04/25/2021   MCV 87.1 04/25/2021   PLT 289.0 04/25/2021   Lab Results  Component Value Date   IRON 96 04/29/2021   TIBC 461 (H) 04/29/2021   FERRITIN 11 (L) 04/29/2021   Lab Results  Component Value Date   CREATININE 0.87 04/25/2021   BUN 14 04/25/2021   NA 137 04/25/2021   K 4.1 04/25/2021   CL 101 04/25/2021   CO2 23 04/25/2021   Lab Results  Component Value Date   ALT 35 04/25/2021   AST 30 04/25/2021   ALKPHOS 90 04/25/2021   BILITOT 0.3 04/25/2021   Lab Results  Component Value Date   CHOL 184 04/25/2021   Lab Results  Component Value Date   HDL  53.90 04/25/2021   No results found for: [facility] Lab Results  Component Value Date   TRIG 248.0 (H) 04/25/2021   Lab Results  Component Value Date   CHOLHDL 3 04/25/2021   IMPRESSION AND PLAN:  1) Recurrent MDD/dysthymia, GAD. Seems like ongoing probs with this are mostly related to ongoing psychosocial stressors and body image/obesity affects.  No med changes at this time: continue fluox 40 qd and alpraz 0.63m tid. She'll remain on ambien 149mqhs for insomnia as well. CSC and UDS are UTD.  2) IDA, hemoccults neg. Tolerating oral iron for about 5 wks now->repeat of her Hb by rheum 1 wk ago was up to 13. Will recheck iron panel today. As long as iron increasing approp with oral supplemention and she is w/out any melena/hematochezia then we'll presume this is dietary/malabsorption related.  3) Excessive daytime sleepiness: Hard to tell if separate from her chronic fatigue. Still recommend she proceed with plan for sleep MD eval that she has scheduled and she was agreeable to this plan.  An After Visit Summary was printed and given to the patient.  FOLLOW UP: Return in about 6 months (around 12/04/2021) for routine chronic illness f/u.  Signed:  PhCrissie SicklesMD           06/03/2021

## 2021-06-04 LAB — IRON,TIBC AND FERRITIN PANEL
%SAT: 30 % (calc) (ref 16–45)
Ferritin: 23 ng/mL (ref 16–232)
Iron: 129 ug/dL (ref 45–160)
TIBC: 430 mcg/dL (calc) (ref 250–450)

## 2021-07-08 DIAGNOSIS — C539 Malignant neoplasm of cervix uteri, unspecified: Secondary | ICD-10-CM | POA: Insufficient documentation

## 2021-07-08 HISTORY — DX: Malignant neoplasm of cervix uteri, unspecified: C53.9

## 2021-07-09 LAB — HM PAP SMEAR: HM Pap smear: NORMAL

## 2021-07-09 LAB — HM MAMMOGRAPHY

## 2021-07-09 LAB — RESULTS CONSOLE HPV: CHL HPV: NEGATIVE

## 2021-07-09 LAB — CBC AND DIFFERENTIAL: Hemoglobin: 13.7 (ref 12.0–16.0)

## 2021-07-11 ENCOUNTER — Telehealth: Payer: Self-pay

## 2021-07-11 NOTE — Telephone Encounter (Signed)
Patient calling regarding bill for $215.14 for DOS 7/12.  Patient states this should have been for her yearly physical.  It was coded for office visit "follow up RCI".   Please call [phone] to speak to patient.

## 2021-07-11 NOTE — Telephone Encounter (Signed)
Dawn can you please review for coding?

## 2021-07-16 NOTE — Telephone Encounter (Signed)
Msg sent to billing customer service to contact pt. Correct coding & correct visit type per provider. CPE was not completed.

## 2021-07-23 ENCOUNTER — Encounter: Payer: Self-pay | Admitting: Neurology

## 2021-07-23 ENCOUNTER — Ambulatory Visit (INDEPENDENT_AMBULATORY_CARE_PROVIDER_SITE_OTHER): Payer: Commercial Managed Care - PPO | Admitting: Neurology

## 2021-07-23 VITALS — BP 122/80 | HR 76 | Ht 63.0 in | Wt 223.0 lb

## 2021-07-23 DIAGNOSIS — R5382 Chronic fatigue, unspecified: Secondary | ICD-10-CM

## 2021-07-23 DIAGNOSIS — R0683 Snoring: Secondary | ICD-10-CM

## 2021-07-23 DIAGNOSIS — M059 Rheumatoid arthritis with rheumatoid factor, unspecified: Secondary | ICD-10-CM | POA: Diagnosis not present

## 2021-07-23 DIAGNOSIS — J449 Chronic obstructive pulmonary disease, unspecified: Secondary | ICD-10-CM | POA: Diagnosis not present

## 2021-07-23 DIAGNOSIS — G9332 Myalgic encephalomyelitis/chronic fatigue syndrome: Secondary | ICD-10-CM

## 2021-07-23 DIAGNOSIS — G4719 Other hypersomnia: Secondary | ICD-10-CM | POA: Diagnosis not present
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
## 2021-08-18 ENCOUNTER — Telehealth: Payer: Self-pay

## 2021-08-18 NOTE — Telephone Encounter (Signed)
LVM for pt to call me back to schedule sleep study  

## 2021-08-23 DIAGNOSIS — G4733 Obstructive sleep apnea (adult) (pediatric): Secondary | ICD-10-CM

## 2021-08-23 HISTORY — PX: POLYSOMNOGRAPHY: SHX453

## 2021-08-23 HISTORY — DX: Obstructive sleep apnea (adult) (pediatric): G47.33

## 2021-09-03 ENCOUNTER — Ambulatory Visit (INDEPENDENT_AMBULATORY_CARE_PROVIDER_SITE_OTHER): Payer: Commercial Managed Care - PPO | Admitting: Neurology

## 2021-09-03 DIAGNOSIS — J449 Chronic obstructive pulmonary disease, unspecified: Secondary | ICD-10-CM

## 2021-09-03 DIAGNOSIS — R0683 Snoring: Secondary | ICD-10-CM

## 2021-09-03 DIAGNOSIS — Z9889 Other specified postprocedural states: Secondary | ICD-10-CM

## 2021-09-03 DIAGNOSIS — G4733 Obstructive sleep apnea (adult) (pediatric): Secondary | ICD-10-CM

## 2021-09-03 DIAGNOSIS — M059 Rheumatoid arthritis with rheumatoid factor, unspecified: Secondary | ICD-10-CM

## 2021-09-03 DIAGNOSIS — G9332 Myalgic encephalomyelitis/chronic fatigue syndrome: Secondary | ICD-10-CM

## 2021-09-03 DIAGNOSIS — G4719 Other hypersomnia: Secondary | ICD-10-CM

## 2021-09-08 DIAGNOSIS — R0683 Snoring: Secondary | ICD-10-CM | POA: Insufficient documentation

## 2021-09-08 DIAGNOSIS — G4719 Other hypersomnia: Secondary | ICD-10-CM | POA: Insufficient documentation

## 2021-09-08 NOTE — Progress Notes (Signed)
IMPRESSION:  This HST confirms the presence of severe, complex sleep apnea with the vast majority being obstructive in origin.  Since the device could not differentiate between non-REM and REM sleep it is likely that the patient did not enter more than 5% REM sleep.  OSA overlaps likely with COPD: She has significant hypoxia with a nadir of 73%.    RECOMMENDATION: The severity of apnea and hypoxia will not be effectively treated with any other option but positive airway pressure therapy.  This patient may benefit from CPAP or BiPAP. If in any way possible I would like a titration in the sleep lab to also see if oxygen supplementation may be necessary. Should this not be possible I will order as a Plan B an auto titration CPAP device with a setting between 6 and 18 cm water pressure, 3 cm EPR, heated humidification and a mask of patient's choice.   Please note that she has a history of facial reconstruction and deviated septum nasi.

## 2021-09-08 NOTE — Addendum Note (Signed)
Addended by: Larey Seat on: 09/08/2021 06:01 PM   Modules accepted: Orders

## 2021-09-08 NOTE — Progress Notes (Signed)
[facility] TEST REPORT ( by Watch PAT)   STUDY DATE:  loaded data 10-17 -22 DOB:  1961-05-21    ORDERING CLINICIAN: Larey Seat, MD  REFERRING CLINICIAN: McGowen, Philip,MD   CLINICAL INFORMATION/HISTORY: Olivia Mckee is a 60  year old female patient who had been seen here upon PCP referral on 07/23/2021 :   Chief concern according to patient :  " They say I have sleep apnea, I snored all my adult  life". 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 I sleep on my side. I am raising my 2 grandsons.  I am worried, overwhelmed at times, I am sleeping a lot"    Tinya Cadogan has a  medical history of Alcoholism ([employer]), Allergic rhinitis, Anxiety and depression, Asthma, Cervical cancer ([place]), Choledocholithiasis, rheumatoid arthritis on MTX- COPD (chronic obstructive pulmonary disease) ([place]), Diverticulosis (2015), Elevated LFTs, GERD (gastroesophageal reflux disease), History of adenomatous polyp of colon (04/02/2014), History of pyelonephritis (12/2019),  Hypercholesterolemia, chronic Insomnia, Iron deficiency anemia (04/2021), Osteoporosis, Perforation of cecum, and Ventral hernia..    Sleep relevant medical history: Nocturia: RLS, Insomnia, fatigue, anemia, Tonsillectomy,  deviated septum nasal reconstruction.     Epworth sleepiness score: 11/24. FSS at 36/63 points.    BMI: 39.5  kg/m   Neck Circumference: 16"   FINDINGS:   Sleep Summary:   Total Recording Time (hours, min): The total recording time for this home sleep test amounted to 9 hours and 21 minutes of which 8 hours and 29 minutes was a total sleep time calculated.  The device could not calculate REM sleep.                                    Respiratory Indices:   Calculated pAHI (per hour):   The overall AHI was 48.9/h.  Some of these apneas were central in nature or related to Cheyne-Stokes respirations about 20 total-but there were over 402 apneas and hypopneas.                           Supine AHI: Positional apnea data show a supine positional AHI of 59.1/h, in prone position 72.9/h, and on the left side at 36.4/h.  Snoring was loud with a mean volume of 46 dB.  Over 80% of the total sleep time were associated with snoring.                                                  Oxygen Saturation Statistics:            O2 Saturation Range (%): Oxygen saturation varied between a minimum or nadir of 73% and a maximum of 98% with a mean saturation of 89%                                     O2 Saturation (minutes) <89%: Total desaturation time or hypoxia time amounted 214.3 minutes the equivalent of 22.4% of the total sleep time.   Hypoxia lower than 90% oxygen saturation amounted to 205.2 minutes the equivalent of 40% of total sleep time.  Pulse Rate Statistics:            Pulse Range: Varied between a minimum of 53 and a maximum 101 bpm with a mean heart rate of 70 bpm.  Please note that this home sleep test device cannot differentiate cardiac rhythms and gives data of cardiac rate only.               IMPRESSION:  This HST confirms the presence of severe, complex sleep apnea with the vast majority being obstructive in origin.  Since the device could not differentiate between non-REM and REM sleep it is likely that the patient did not enter more than 5% REM sleep.  She also has significant hypoxia with a nadir of 73%.     RECOMMENDATION: The severity of apnea and hypoxia will not be effectively treated with any other option but positive airway pressure therapy.  This patient may benefit from CPAP or BiPAP. If in any way possible I would like a titration in the sleep lab to also see if oxygen supplementation may be necessary. Should this not be possible I will order as a Plan B an auto titration CPAP device with a setting between 6 and 18 cm water pressure, 3 cm EPR, heated humidification and a mask of patient's choice.   Please note that she has a history of facial  reconstruction and deviated septum nasi.    INTERPRETING PHYSICIAN:   Larey Seat, MD   Medical Director of [facility].

## 2021-09-08 NOTE — Procedures (Signed)
[facility] TEST REPORT ( by Watch PAT)   STUDY DATE:  loaded data 10-17 -22 DOB:  10-28-1961    ORDERING CLINICIAN: Larey Seat, MD  REFERRING CLINICIAN: McGowen, Philip,MD   CLINICAL INFORMATION/HISTORY: Olivia Mckee is a 60  year old female patient who had been seen here upon PCP referral on 07/23/2021 :   Chief concern according to patient :  " They say I have sleep apnea, I snored all my adult  life". 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 I sleep on my side. I am raising my 2 grandsons.  I am worried, overwhelmed at times, I am sleeping a lot"    Olivia Mckee has a  medical history of Alcoholism ([place]), Allergic rhinitis, Anxiety and depression, Asthma, Cervical cancer ([place]), Choledocholithiasis, rheumatoid arthritis on MTX- COPD (chronic obstructive pulmonary disease) ([place]), Diverticulosis (2015), Elevated LFTs, GERD (gastroesophageal reflux disease), History of adenomatous polyp of colon (04/02/2014), History of pyelonephritis (12/2019),  Hypercholesterolemia, chronic Insomnia, Iron deficiency anemia (04/2021), Osteoporosis, Perforation of cecum, and Ventral hernia..    Sleep relevant medical history: Nocturia: RLS, Insomnia, fatigue, anemia, Tonsillectomy,  deviated septum nasal reconstruction.     Epworth sleepiness score: 11/24. FSS at 36/63 points.    BMI: 39.5  kg/m   Neck Circumference: 16"   FINDINGS:   Sleep Summary:   Total Recording Time (hours, min): The total recording time for this home sleep test amounted to 9 hours and 21 minutes of which 8 hours and 29 minutes was a total sleep time calculated.  The device could not calculate REM sleep.                                    Respiratory Indices:   Calculated pAHI (per hour):   The overall AHI was 48.9/h.  Some of these apneas were central in nature or related to Cheyne-Stokes respirations about 20 total-but there were over 402 apneas and hypopneas.                          Supine AHI:  Positional apnea data show a supine positional AHI of 59.1/h, in prone position 72.9/h, and on the left side at 36.4/h.  Snoring was loud with a mean volume of 46 dB.  Over 80% of the total sleep time were associated with snoring.                                                  Oxygen Saturation Statistics:            O2 Saturation Range (%): Oxygen saturation varied between a minimum or nadir of 73% and a maximum of 98% with a mean saturation of 89%                                     O2 Saturation (minutes) <89%: Total desaturation time or hypoxia time amounted 214.3 minutes the equivalent of 22.4% of the total sleep time.   Hypoxia lower than 90% oxygen saturation amounted to 205.2 minutes the equivalent of 40% of total sleep time.          Pulse Rate Statistics:  Pulse Range: Varied between a minimum of 53 and a maximum 101 bpm with a mean heart rate of 70 bpm.  Please note that this home sleep test device cannot differentiate cardiac rhythms and gives data of cardiac rate only.               IMPRESSION:  This HST confirms the presence of severe, complex sleep apnea with the vast majority being obstructive in origin.  Since the device could not differentiate between non-REM and REM sleep it is likely that the patient did not enter more than 5% REM sleep.  She also has significant hypoxia with a nadir of 73%.     RECOMMENDATION: The severity of apnea and hypoxia will not be effectively treated with any other option but positive airway pressure therapy.  This patient may benefit from CPAP or BiPAP. If in any way possible I would like a titration in the sleep lab to also see if oxygen supplementation may be necessary. Should this not be possible I will order as a Plan B an auto titration CPAP device with a setting between 6 and 18 cm water pressure, 3 cm EPR, heated humidification and a mask of patient's choice.   Please note that she has a history of facial reconstruction and  deviated septum nasi.    INTERPRETING PHYSICIAN:   Larey Seat, MD   Medical Director of [facility] at [facility].

## 2021-09-09 ENCOUNTER — Encounter: Payer: Self-pay | Admitting: Family Medicine

## 2021-09-09 ENCOUNTER — Telehealth: Payer: Self-pay

## 2021-09-09 NOTE — Telephone Encounter (Signed)
I called pt. I advised pt that Dr. Brett Fairy reviewed their sleep study results and found that pt has severe osa. Dr. Brett Fairy recommends that pt come in for an in lab cpap titration but this is unlikely to be approved by insurance. The alternative is an auto-pap. I reviewed PAP compliance expectations with the pt. Pt is agreeable to starting an auto-PAP. I advised pt that an order will be sent to a DME, [facility], and [facility] will call the pt within about one week after they file with the pt's insurance. [facility] will show the pt how to use the machine, fit for masks, and troubleshoot the auto-PAP if needed. A follow up appt was made for insurance purposes with Amy, NP on 01/19/2022 at 8:30am. Pt verbalized understanding to arrive 15 minutes early and bring their auto-PAP. A letter with all of this information in it will be mailed to the pt as a reminder. I verified with the pt that the address we have on file is correct. Pt verbalized understanding of results. Pt had no questions at this time but was encouraged to call back if questions arise. I have sent the order to [facility] and have received confirmation that they have received the order.

## 2021-09-09 NOTE — Telephone Encounter (Signed)
Patient called office and I was able to take this call.  She is wondering how much a cpap titration would cost her if she decided to pay OOP. I will ask the sleep lab manager to return her call to discuss further.  Per Dr. Edwena Felty result note for the PSG: "The severity of apnea and hypoxia will not be effectively treated with any other option but positive airway pressure therapy.  This patient may benefit from CPAP or BiPAP. If in any way possible I would like a titration in the sleep lab to also see if oxygen supplementation may be necessary. Should this not be possible I will order as a Plan B an auto titration CPAP device with a setting between 6 and 18 cm water pressure, 3 cm EPR, heated humidification and a mask of patient's choice.   Please note that she has a history of facial reconstruction and deviated septum nasi."

## 2021-09-09 NOTE — Telephone Encounter (Signed)
-----   Message from Larey Seat, MD sent at 09/08/2021  6:01 PM EDT ----- IMPRESSION:  This HST confirms the presence of severe, complex sleep apnea with the vast majority being obstructive in origin.  Since the device could not differentiate between non-REM and REM sleep it is likely that the patient did not enter more than 5% REM sleep.  OSA overlaps likely with COPD: She has significant hypoxia with a nadir of 73%.    RECOMMENDATION: The severity of apnea and hypoxia will not be effectively treated with any other option but positive airway pressure therapy.  This patient may benefit from CPAP or BiPAP. If in any way possible I would like a titration in the sleep lab to also see if oxygen supplementation may be necessary. Should this not be possible I will order as a Plan B an auto titration CPAP device with a setting between 6 and 18 cm water pressure, 3 cm EPR, heated humidification and a mask of patient's choice.   Please note that she has a history of facial reconstruction and deviated septum nasi.

## 2021-09-10 ENCOUNTER — Encounter: Payer: Self-pay | Admitting: Family Medicine

## 2021-09-11 ENCOUNTER — Telehealth: Payer: Self-pay | Admitting: Emergency Medicine

## 2021-09-11 MED ORDER — AZITHROMYCIN 250 MG PO TABS: ORAL_TABLET | ORAL | 0 refills | Status: DC

## 2021-09-11 MED ORDER — PREDNISONE 10 MG PO TABS
ORAL_TABLET | ORAL | 0 refills | Status: DC
Start: 2021-09-11 — End: 2021-10-30

## 2021-09-11 NOTE — Telephone Encounter (Signed)
Spoke with pt  She is c/o increased wheezing and cough with green sputum for about 3 wks now  She states no f/c/s, aches, sore throat, HA  She has taken covid test and this was neg  She is on her Bevespi and she has albuterol inhaler but rarely uses this  She is scheduled for appt with RB here on 10/10/21  I offered sooner appt with APP bc she has not been seen since Nov 2021  She only wants to see RB and nothing sooner with him  Please advise thanks  Allergies  Allergen Reactions   Penicillins Anaphylaxis and Rash   Sulfa Antibiotics Anaphylaxis and Rash

## 2021-09-11 NOTE — Telephone Encounter (Signed)
Spoke with the pt and notified of response per Dr Lamonte Sakai  Pt verbalized understanding  Rxs were sent to pharm  Nothing further needed

## 2021-09-11 NOTE — Telephone Encounter (Signed)
Pred >> Take 40mg  daily for 3 days, then 30mg  daily for 3 days, then 20mg  daily for 3 days, then 10mg  daily for 3 days, then stop Azithro >> Z pack  Thanks.

## 2021-10-10 ENCOUNTER — Ambulatory Visit (INDEPENDENT_AMBULATORY_CARE_PROVIDER_SITE_OTHER): Payer: Commercial Managed Care - PPO | Admitting: Emergency Medicine

## 2021-10-10 ENCOUNTER — Encounter: Payer: Self-pay | Admitting: Emergency Medicine

## 2021-10-10 ENCOUNTER — Other Ambulatory Visit: Payer: Self-pay

## 2021-10-10 VITALS — BP 114/70 | HR 77 | Temp 98.3°F | Ht 64.0 in | Wt 225.6 lb

## 2021-10-10 DIAGNOSIS — J449 Chronic obstructive pulmonary disease, unspecified: Secondary | ICD-10-CM | POA: Diagnosis not present

## 2021-10-10 DIAGNOSIS — G4733 Obstructive sleep apnea (adult) (pediatric): Secondary | ICD-10-CM | POA: Insufficient documentation

## 2021-10-10 DIAGNOSIS — J301 Allergic rhinitis due to pollen: Secondary | ICD-10-CM

## 2021-10-10 DIAGNOSIS — Z23 Encounter for immunization: Secondary | ICD-10-CM

## 2021-10-10 DIAGNOSIS — R053 Chronic cough: Secondary | ICD-10-CM

## 2021-10-10 NOTE — Progress Notes (Signed)
Subjective:    Patient ID: Olivia Mckee, female    DOB: [birth year], 60 y.o.   MRN: [id number]  HPI  ROV 10/14/20 --60 year old woman with moderate COPD, chronic cough, GERD, allergic rhinitis.  She follows up today, reports that she is feeling well. She does have exertional SOB with rapid walking or work. Also seems to happen if she gets flustered or frustrated. Minimal cough. C/o some L flank pain and dysuria for several days.  Responds to albuterol, uses about 2x a month. Remains on Bevespi, likes it, will need a refill and ? PA for this.  She had an acute exacerbation in March that was treated with antibiotics, steroids.  Most recent pulmonary function testing was done in 09/2017, spirometry 07/2018.  We had discussed possibly a sleep study in the past but has not been done - she does not want to do right now. She is on MTX for RA since 3 months ago. Protonix bid. Uses generic loratadine.  COVID up to date, with booster. Flu shot done.   ROV 10/10/21 --follow-up visit for 60 year old woman with a history of moderate COPD, rheumatoid arthritis on methotrexate, chronic cough with GERD and allergic rhinitis.  This is her annual follow-up.  She reports that she has had flaring symptoms in the last month, treated with azithromycin and prednisone.  She has flares like this about 2-4 times a year.  Usually cough. She is still dealing with some residual throat clearing. Off her loratadine right now.  She has been maintained on Bevespi, uses albuterol approximately 2x a week, when she rushes and get SOB or before a walk She was started on CPAP by neurology, good compliance.   Review of Systems  Constitutional:  Negative for fever and unexpected weight change.  HENT:  Negative for congestion, dental problem, ear pain, nosebleeds, postnasal drip, rhinorrhea, sinus pressure, sneezing, sore throat and trouble swallowing.   Eyes:  Negative for redness and itching.  Respiratory:  Positive for cough and  shortness of breath. Negative for chest tightness and wheezing.   Cardiovascular:  Negative for palpitations and leg swelling.  Gastrointestinal:  Negative for nausea and vomiting.  Genitourinary:  Positive for dysuria and flank pain.  Musculoskeletal:  Negative for joint swelling.  Skin:  Negative for rash.  Neurological:  Negative for headaches.  Hematological:  Does not bruise/bleed easily.  Psychiatric/Behavioral:  Negative for dysphoric mood. The patient is not nervous/anxious.      Objective:   Physical Exam Vitals:   10/10/21 0855  BP: 114/70  Pulse: 77  Temp: 98.3 F (36.8 C)  TempSrc: Oral  SpO2: 99%  Weight: 225 lb 9.6 oz (102.3 kg)  Height: 5\' 4"  (1.626 m)   Gen: Pleasant, Overweight woman, in no distress,  normal affect, frequent throat clearing  ENT: No lesions,  mouth clear,  oropharynx clear, no postnasal drip  Neck: No JVD, upper airway noise on forced expiration otherwise clear  Lungs: No use of accessory muscles, no wheeze on a normal breath, some referred upper airway noise  Cardiovascular: RRR, heart sounds normal, no murmur or gallops, no peripheral edema  Musculoskeletal: No deformities, no cyanosis or clubbing  Neuro: alert, non focal  Skin: Warm, no lesions or rashes     Assessment & Plan:  Chronic obstructive pulmonary disease ([place]) Considered changing her to an alternative that includes an ICS but she has had lots of difficulty in the past with intolerances, thrush.  She would like to stay on the [place].  Continue your Bevespi 2 puffs twice a day. Keep your albuterol available to use 2 puffs when you needed for shortness of breath, chest tightness, wheezing.. Flu shot today COVID-19 vaccine up-to-date Follow with Dr. Lamonte Sakai in 12 months or sooner if you have any problems.   Allergic rhinitis Still having some residual upper airway irritation, globus sensation, throat clearing.  She is not currently on her loratadine and we will restart  this.  Advised that she take this during the spring and fall months.  Obstructive sleep apnea Started on CPAP by neurology.  Good compliance.  Chronic cough She flares about twice a year, usually in the allergy season.  Baltazar Apo, MD, PhD 10/10/2021, 9:20 AM [facility] [phone] or if no answer [phone]

## 2021-10-10 NOTE — Assessment & Plan Note (Signed)
Started on CPAP by neurology.  Good compliance.

## 2021-10-10 NOTE — Patient Instructions (Signed)
Continue your Bevespi 2 puffs twice a day. Keep your albuterol available to use 2 puffs when you needed for shortness of breath, chest tightness, wheezing. Restart your loratadine (generic Claritin) 10 mg through the fall season.  You should probably take this during the fall and spring. Flu shot today COVID-19 vaccine up-to-date Continue CPAP every night Follow with Dr. Lamonte Sakai in 12 months or sooner if you have any problems.

## 2021-10-10 NOTE — Assessment & Plan Note (Signed)
Still having some residual upper airway irritation, globus sensation, throat clearing.  She is not currently on her loratadine and we will restart this.  Advised that she take this during the spring and fall months.

## 2021-10-10 NOTE — Assessment & Plan Note (Signed)
She flares about twice a year, usually in the allergy season.

## 2021-10-10 NOTE — Assessment & Plan Note (Addendum)
Considered changing her to an alternative that includes an ICS but she has had lots of difficulty in the past with intolerances, thrush.  She would like to stay on the [place].  Continue your Bevespi 2 puffs twice a day. Keep your albuterol available to use 2 puffs when you needed for shortness of breath, chest tightness, wheezing.. Flu shot today COVID-19 vaccine up-to-date Follow with Dr. Lamonte Sakai in 12 months or sooner if you have any problems.

## 2021-10-27 NOTE — Progress Notes (Signed)
Dr Dohmeier's    PATIENT: Olivia Mckee DOB: Aug 19, 1961  REASON FOR VISIT: follow up HISTORY FROM: patient  Chief Complaint  Patient presents with   Follow-up    New room, alone. CPAP f/u. Set up 09/17/21 w/ machine.      HISTORY OF PRESENT ILLNESS:  10/28/21 ALL:  Olivia Mckee is a 60 y.o. female here today for follow up for OSA on CPAP.  HST 09/03/2021 showed severe, complex sleep apnea with mostly obstructive events. AHI was 48.8/hr. O2 nadir 73% with 214 minutes of hypoxia noted. She was started on AutoPAP. She reports doing fairly well. She is not dozing off as much during the day. She wakes feeling more refreshed. She continues to follow with rheumatology for rheumatoid arthritis versus fibromyalgia.     HISTORY: (copied from Dr Dohmeier's previous note)  Olivia Mckee is a 60 y.o. year old White or Caucasian female patient seen here as a referral on 07/23/2021 :   Chief concern according to patient :  " they say I have sleep apnea, I snore all my adult  life". 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 I am raising my 2 grandsons. I am worried, overwhelmed at times, I am sleeping a lot'- I am more concerned about memory- "    I have the pleasure of seeing Olivia Mckee today, a right-handed White or Caucasian female with a possible sleep disorder.  She has a  has a past medical history of Alcoholism ([place]), Allergic rhinitis, Anxiety and depression, Asthma, Cervical cancer ([place]), Choledocholithiasis, rheumatoid arthritis, MTX-   COPD (chronic obstructive pulmonary disease) ([place]), Diverticulosis (2015), Elevated LFTs, GERD (gastroesophageal reflux disease), History of adenomatous polyp of colon (04/02/2014), History of pyelonephritis (12/2019),  Hypercholesterolemia, Insomnia, Iron deficiency anemia (04/2021), Osteoporosis, Perforation of cecum, and Ventral hernia..    Sleep relevant medical history: Nocturia: RLS, anemia,  Tonsillectomy,  deviated septum nasal reconstruction-     Family medical /sleep history: mother  with OSA, insomnia, sleep walkers.    Social history:  Patient is working as  and lives in a household with husband and 2 grand children- her daughter was a cocaine user- at the time 6 months and age 52.  The patient currently is staying home- Tobacco use/ quit 15 years ago .  ETOH use / sober for 6 years ,  Caffeine intake in form of Coffee( /) Soda( 2 cans in Pm ) Tea ( /) or energy drinks. Regular exercise in form of swimming. .   Hobbies : cooking.   Sleep habits are as follows: The patient's dinner time is between 5-7 PM. The patient goes to bed at 9 PM and continues to sleep for 2-3 hours, wakes for 1-2 bathroom breaks, the first time at 12 AM.   The preferred sleep position is sideways, with the support of 1 pillow.  Bed is raised too .Dreams are reportedly frequent/vivid.  5.30  AM is the usual rise time. The patient wakes up spontaneously.  He/ She reports not feeling refreshed or restored in AM, with symptoms such as dry mouth, morning headaches, and residual fatigue.  Naps are taken frequently, unscheduled, lasting from 15 to 120 minutes and are more refreshing than nocturnal sleep.    REVIEW OF SYSTEMS: Out of a complete 14 system review of symptoms, the patient complains only of the following symptoms, generalized pain, fatigue and all other reviewed systems are negative.  ESS: 8/24, previously 11/24  ALLERGIES: Allergies  Allergen Reactions   Penicillins Anaphylaxis and Rash  Sulfa Antibiotics Anaphylaxis and Rash    HOME MEDICATIONS: Outpatient Medications Prior to Visit  Medication Sig Dispense Refill   albuterol (VENTOLIN HFA) 108 (90 Base) MCG/ACT inhaler Inhale 2 puffs into the lungs every 6 (six) hours as needed for wheezing or shortness of breath. 8 g 6   ALPRAZolam (XANAX) 0.5 MG tablet Take 1 tablet (0.5 mg total) by mouth 3 (three) times daily as needed. 90 tablet 5   azithromycin (ZITHROMAX Z-PAK) 250 MG tablet Take as  directed 6 each 0   B-D TB SYRINGE 1CC/27GX1/2" 27G X 1/2" 1 ML MISC Inject into the skin.     cyclobenzaprine (FLEXERIL) 10 MG tablet TAKE ONE TABLET (10 MG DOSE) BY MOUTH 2 (TWO) TIMES A DAY AS NEEDED FOR MUSCLE SPASMS. 180 tablet 1   FERROUS SULFATE PO Take 325 mg by mouth daily.     FLUoxetine (PROZAC) 40 MG capsule Take 1 capsule (40 mg total) by mouth daily. 90 capsule 3   Glycopyrrolate-Formoterol (BEVESPI AEROSPHERE) 9-4.8 MCG/ACT AERO Take 2 puffs by mouth in the morning and at bedtime. 32.1 each 1   leucovorin (WELLCOVORIN) 5 MG tablet Take 5 mg by mouth daily.     meloxicam (MOBIC) 15 MG tablet Take 15 mg by mouth daily.     Methotrexate Sodium (METHOTREXATE, PF,) 50 MG/2ML injection 7 mL     pantoprazole (PROTONIX) 40 MG tablet Take 1 tablet (40 mg total) by mouth 2 (two) times daily. 180 tablet 3   pravastatin (PRAVACHOL) 10 MG tablet TAKE 1 TABLET BY MOUTH EVERY DAY 90 tablet 1   traMADol (ULTRAM) 50 MG tablet Take 50 mg by mouth 4 (four) times daily as needed.     valACYclovir (VALTREX) 500 MG tablet TAKE 1 TABLET BY MOUTH THEN TAKE 1 TABLET 12 HOURS LATER     zolpidem (AMBIEN) 10 MG tablet Take 1 tablet (10 mg total) by mouth at bedtime as needed. 30 tablet 5   predniSONE (DELTASONE) 10 MG tablet 4 x 3 days, 3 x 3 days, 2 x 3 days, 1 x 3 days, then stop (Patient not taking: Reported on 10/28/2021) 30 tablet 0   No facility-administered medications prior to visit.    PAST MEDICAL HISTORY: Past Medical History:  Diagnosis Date   Alcoholism ([place])    Allergic rhinitis    Anxiety and depression    Arthritis of knee    bilat, CPPD+ OA   Asthma    Cervical cancer ([place])    Choledocholithiasis    COPD (chronic obstructive pulmonary disease) ([place])    Diverticulosis 2015   Sigmoid colon (noted on colonoscopy)   Elevated LFTs    GERD (gastroesophageal reflux disease)    History of adenomatous polyp of colon 04/02/2014   Recall 5 yrs   History of pyelonephritis 12/2019    Hypercholesterolemia    Inflammatory arthritis    +mild elev ESR, Rheum eval 07/2020: rapid resp to prednisone, felt to have inflamm arth + OA and CCPD bilat knees. Methotrexate INJ q week.   Insomnia    Iron deficiency anemia 04/2021   started iron, hemoccults x 3 NEG   Myalgia, unspecified site    + arthralgias---she is tender EVERYWHERE (summer 2021)->rheum ref   OSA (obstructive sleep apnea) 08/2021   2022 sleep study (Dr. Brett Fairy) severe, with oxygen nadir of 73%. CPAP or BiPap recommended   Osteoporosis    Perforation of cecum    perf'd in context of SBO that followed GB surgery; no colon  removed.   Ventral hernia     PAST SURGICAL HISTORY: Past Surgical History:  Procedure Laterality Date   BREAST BIOPSY  2010   benign cyst   CHOLECYSTECTOMY     COLONOSCOPY  04/02/2014   2015 adenoma x 2->recall 5 yrs ([facility])   Iliostomy     +  takedown   MOUTH SURGERY     NOSE SURGERY     PARTIAL HYSTERECTOMY  1983   perfor colon repair     SBO after gallbladder surgery   SALPINGOOPHORECTOMY Bilateral 05/10/2014   TONSILLECTOMY     VENTRAL HERNIA REPAIR  2016   open, with mesh    FAMILY HISTORY: Family History  Problem Relation Age of Onset   Diabetes Mother    Hypertension Mother    Hearing loss Mother    Depression Mother    Diabetes Father    Hearing loss Father    Early death Father    Stroke Maternal Grandmother    Kidney disease Maternal Grandmother    Diabetes Maternal Grandmother    Heart disease Maternal Grandmother    Arthritis Maternal Grandmother    Stroke Maternal Grandfather    Prostate cancer Maternal Grandfather    Diabetes Maternal Grandfather    Heart disease Maternal Grandfather    Arthritis Maternal Grandfather    Alcohol abuse Maternal Grandfather    Depression Maternal Grandfather    Diabetes Paternal Grandmother    Alcohol abuse Paternal Grandmother    Diabetes Paternal Grandfather    Alcohol abuse Paternal Grandfather    Heart  disease Paternal Grandfather     SOCIAL HISTORY: Social History   Socioeconomic History   Marital status: Married    Spouse name: Dominica Severin   Number of children: 1   Years of education: Not on file   Highest education level: GED or equivalent  Occupational History   Not on file  Tobacco Use   Smoking status: Former    Packs/day: 1.00    Years: 25.00    Pack years: 25.00    Types: Cigarettes    Quit date: 11/23/2010    Years since quitting: 10.9   Smokeless tobacco: Never  Substance and Sexual Activity   Alcohol use: No   Drug use: No   Sexual activity: Not on file  Other Topics Concern   Not on file  Social History Narrative   Married, 1 daughter, several grandkids (2 of whom pt is raising).   Educ: some college   Occup: retired Development worker, international aid.   Tob: 20 pack-yr hx, quit approx 2005.   Alc: hx of alcoholism, quit 2018.   No hx of drug abuse.      left handed   Caffeine: 2 12oz can a day   Social Determinants of Radio broadcast assistant Strain: Low Risk    Difficulty of Paying Living Expenses: Not very hard  Food Insecurity: No Food Insecurity   Worried About Charity fundraiser in the Last Year: Never true   Ran Out of Food in the Last Year: Never true  Transportation Needs: No Transportation Needs   Lack of Transportation (Medical): No   Lack of Transportation (Non-Medical): No  Physical Activity: Insufficiently Active   Days of Exercise per Week: 3 days   Minutes of Exercise per Session: 30 min  Stress: Stress Concern Present   Feeling of Stress : Very much  Social Connections: Moderately Isolated   Frequency of Communication with Friends and Family: More than three  times a week   Frequency of Social Gatherings with Friends and Family: Never   Attends Religious Services: Never   Marine scientist or Organizations: No   Attends Music therapist: Not on file   Marital Status: Married  Human resources officer Violence: Not on file     PHYSICAL  EXAM  Vitals:   10/28/21 0941  BP: 121/67  Pulse: 70  Weight: 221 lb (100.2 kg)  Height: 5' 4" (1.626 m)   Body mass index is 37.93 kg/m.  Generalized: Well developed, in no acute distress  Cardiology: normal rate and rhythm, no murmur noted Respiratory: clear to auscultation bilaterally  Neurological examination  Mentation: Alert oriented to time, place, history taking. Follows all commands speech and language fluent Cranial nerve II-XII: Pupils were equal round reactive to light. Extraocular movements were full, visual field were full  Motor: The motor testing reveals 5 over 5 strength of all 4 extremities. Good symmetric motor tone is noted throughout.  Gait and station: Gait is normal.    DIAGNOSTIC DATA (LABS, IMAGING, TESTING) - I reviewed patient records, labs, notes, testing and imaging myself where available.  No flowsheet data found.   Lab Results  Component Value Date   WBC 6.1 04/25/2021   HGB 11.8 (L) 04/25/2021   HCT 36.8 04/25/2021   MCV 87.1 04/25/2021   PLT 289.0 04/25/2021      Component Value Date/Time   NA 137 04/25/2021 0831   K 4.1 04/25/2021 0831   CL 101 04/25/2021 0831   CO2 23 04/25/2021 0831   GLUCOSE 97 04/25/2021 0831   BUN 14 04/25/2021 0831   CREATININE 0.87 04/25/2021 0831   CALCIUM 9.3 04/25/2021 0831   PROT 6.6 04/25/2021 0831   ALBUMIN 3.9 04/25/2021 0831   AST 30 04/25/2021 0831   ALT 35 04/25/2021 0831   ALKPHOS 90 04/25/2021 0831   BILITOT 0.3 04/25/2021 0831   Lab Results  Component Value Date   CHOL 184 04/25/2021   HDL 53.90 04/25/2021   LDLDIRECT 100.0 04/25/2021   TRIG 248.0 (H) 04/25/2021   CHOLHDL 3 04/25/2021   No results found for: HGBA1C No results found for: VITAMINB12 Lab Results  Component Value Date   TSH 2.05 04/25/2021     ASSESSMENT AND PLAN 60 y.o. year old female  has a past medical history of Alcoholism ([place]), Allergic rhinitis, Anxiety and depression, Arthritis of knee, Asthma, Cervical  cancer ([place]), Choledocholithiasis, COPD (chronic obstructive pulmonary disease) ([place]), Diverticulosis (2015), Elevated LFTs, GERD (gastroesophageal reflux disease), History of adenomatous polyp of colon (04/02/2014), History of pyelonephritis (12/2019), Hypercholesterolemia, Inflammatory arthritis, Insomnia, Iron deficiency anemia (04/2021), Myalgia, unspecified site, OSA (obstructive sleep apnea) (08/2021), Osteoporosis, Perforation of cecum, and Ventral hernia. here with     ICD-10-CM   1. Complex sleep apnea syndrome  G47.31     2. Sleep apnea with use of continuous positive airway pressure (CPAP)  G47.30 For home use only DME continuous positive airway pressure (CPAP)        Carter Kaman is doing well on CPAP therapy. Compliance report reveals excellent compliance. She is feeling more energized and is resting better. She does have a significant air leak. AHI is mildly elevated. I will send her for a mask refitting. May adjust max pressure if needed after correcting air leak. She was encouraged to continue using CPAP nightly and for greater than 4 hours each night. We will update supply orders as indicated. Risks of untreated sleep apnea review and  education materials provided. Healthy lifestyle habits encouraged. She will follow up in 4-6 months, sooner if needed. She verbalizes understanding and agreement with this plan.    Orders Placed This Encounter  Procedures   For home use only DME continuous positive airway pressure (CPAP)    Mask refitting for elevated leak, prefers [place] office    Order Specific Question:   Length of Need    Answer:   Lifetime    Order Specific Question:   Patient has OSA or probable OSA    Answer:   Yes    Order Specific Question:   Is the patient currently using CPAP in the home    Answer:   Yes    Order Specific Question:   Settings    Answer:   Other see comments    Order Specific Question:   CPAP supplies needed    Answer:   Mask, headgear,  cushions, filters, heated tubing and water chamber      No orders of the defined types were placed in this encounter.     Debbora Presto, FNP-C 10/28/2021, 10:22 AM [facility] [address] [place], [place] [address] [phone]

## 2021-10-27 NOTE — Patient Instructions (Addendum)
Please continue using your CPAP regularly. While your insurance requires that you use CPAP at least 4 hours each night on 70% of the nights, I recommend, that you not skip any nights and use it throughout the night if you can. Getting used to CPAP and staying with the treatment long term does take time and patience and discipline. Untreated obstructive sleep apnea when it is moderate to severe can have an adverse impact on cardiovascular health and raise her risk for heart disease, arrhythmias, hypertension, congestive heart failure, stroke and diabetes. Untreated obstructive sleep apnea causes sleep disruption, nonrestorative sleep, and sleep deprivation. This can have an impact on your day to day functioning and cause daytime sleepiness and impairment of cognitive function, memory loss, mood disturbance, and problems focussing. Using CPAP regularly can improve these symptoms.   Follow up with me in 4-6 months

## 2021-10-28 ENCOUNTER — Ambulatory Visit (INDEPENDENT_AMBULATORY_CARE_PROVIDER_SITE_OTHER): Payer: Commercial Managed Care - PPO | Admitting: Family Medicine

## 2021-10-28 ENCOUNTER — Encounter: Payer: Self-pay | Admitting: Family Medicine

## 2021-10-28 VITALS — BP 121/67 | HR 70 | Ht 64.0 in | Wt 221.0 lb

## 2021-10-28 DIAGNOSIS — G4730 Sleep apnea, unspecified: Secondary | ICD-10-CM | POA: Diagnosis not present

## 2021-10-28 DIAGNOSIS — G4731 Primary central sleep apnea: Secondary | ICD-10-CM

## 2021-10-28 DIAGNOSIS — G4739 Other sleep apnea: Secondary | ICD-10-CM

## 2021-10-28 NOTE — Progress Notes (Signed)
CM sent to [facility]

## 2021-10-30 ENCOUNTER — Other Ambulatory Visit: Payer: Self-pay

## 2021-10-30 ENCOUNTER — Encounter: Payer: Self-pay | Admitting: Family Medicine

## 2021-10-30 ENCOUNTER — Ambulatory Visit (INDEPENDENT_AMBULATORY_CARE_PROVIDER_SITE_OTHER): Payer: Commercial Managed Care - PPO | Admitting: Family Medicine

## 2021-10-30 VITALS — BP 116/68 | HR 75 | Temp 97.5°F | Ht 64.0 in | Wt 222.8 lb

## 2021-10-30 DIAGNOSIS — F331 Major depressive disorder, recurrent, moderate: Secondary | ICD-10-CM | POA: Diagnosis not present

## 2021-10-30 DIAGNOSIS — D509 Iron deficiency anemia, unspecified: Secondary | ICD-10-CM

## 2021-10-30 DIAGNOSIS — Z79899 Other long term (current) drug therapy: Secondary | ICD-10-CM | POA: Diagnosis not present

## 2021-10-30 DIAGNOSIS — E782 Mixed hyperlipidemia: Secondary | ICD-10-CM

## 2021-10-30 DIAGNOSIS — F411 Generalized anxiety disorder: Secondary | ICD-10-CM

## 2021-10-30 LAB — COMPREHENSIVE METABOLIC PANEL
ALT: 35 U/L (ref 0–35)
AST: 33 U/L (ref 0–37)
Albumin: 4.3 g/dL (ref 3.5–5.2)
Alkaline Phosphatase: 80 U/L (ref 39–117)
BUN: 12 mg/dL (ref 6–23)
CO2: 26 mEq/L (ref 19–32)
Calcium: 10 mg/dL (ref 8.4–10.5)
Chloride: 102 mEq/L (ref 96–112)
Creatinine, Ser: 0.87 mg/dL (ref 0.40–1.20)
GFR: 72.18 mL/min (ref >60.00)
Glucose, Bld: 87 mg/dL (ref 70–99)
Potassium: 4.2 mEq/L (ref 3.5–5.1)
Sodium: 137 mEq/L (ref 135–145)
Total Bilirubin: 0.5 mg/dL (ref 0.2–1.2)
Total Protein: 7.4 g/dL (ref 6.0–8.3)

## 2021-10-30 LAB — CBC
HCT: 43.1 % (ref 36.0–46.0)
Hemoglobin: 14 g/dL (ref 12.0–15.0)
MCHC: 32.6 g/dL (ref 30.0–36.0)
MCV: 95.8 fl (ref 78.0–100.0)
Platelets: 299 10*3/uL (ref 150.0–400.0)
RBC: 4.5 Mil/uL (ref 3.87–5.11)
RDW: 15.6 % — ABNORMAL HIGH (ref 11.5–15.5)
WBC: 5 10*3/uL (ref 4.0–10.5)

## 2021-10-30 LAB — LIPID PANEL
Cholesterol: 177 mg/dL (ref 0–200)
HDL: 63.1 mg/dL (ref >39.00)
LDL Cholesterol: 90 mg/dL (ref 0–99)
NonHDL: 114.1
Total CHOL/HDL Ratio: 3
Triglycerides: 122 mg/dL (ref 0.0–149.0)
VLDL: 24.4 mg/dL (ref 0.0–40.0)

## 2021-10-30 MED ORDER — ALPRAZOLAM 0.5 MG PO TABS: 0.5000 mg | ORAL_TABLET | Freq: Three times a day (TID) | ORAL | 5 refills | Status: DC | PRN

## 2021-10-30 MED ORDER — ZOLPIDEM TARTRATE 10 MG PO TABS: 10.0000 mg | ORAL_TABLET | Freq: Every evening | ORAL | 5 refills | Status: DC | PRN

## 2021-10-30 MED ORDER — VALACYCLOVIR HCL 500 MG PO TABS: ORAL_TABLET | ORAL | 6 refills | Status: DC

## 2021-10-30 NOTE — Telephone Encounter (Signed)
RF request for Valtrex LOV: 10/30/21 Next ov: 04/30/22 Last written: 11/28/19, historical provider  Please review and advise. Medication pending

## 2021-10-30 NOTE — Progress Notes (Signed)
See student note for this encounter. I have attested it. Signed:  Crissie Sickles, MD           10/30/2021

## 2021-10-30 NOTE — Progress Notes (Addendum)
OFFICE VISIT  10/30/2021  CC:  Chief Complaint  Patient presents with   Follow-up    RCI; pt is fasting     HPI:    Patient is a 60 y.o. female who presents for f/u anxiety and depression, insomnia, and HLD. A/P as of last visit 5 mo ago: 1) Recurrent MDD/dysthymia, GAD: Patients feels that a lot of her depression and anxiety stems from her husband workload, raising teenagers, and grief. Is currently managing it the best she can. Did not mention she feels she needs to go up on her medications.   2) IDA: Did not mentioned blood in her stool or excessive fatigue   3) Excessive daytime sleepiness: Improving a little since using her CPAP machine. Feels she sleeps better at night.  4) Muscle pain: patient reports improved joint pain but feels diffuse muscle pain on her back, arms, and legs.   ROS negative (cough, congestion)  INTERIM HX: Per Dr. Anitra Lauth:  Most recent sleep med f/u was 2 days ago-->she was improved on CPAP, working on mask issues.  Pt has been taking 340m FeSO4 daily for months now. No melena.  PMP AWARE reviewed today: most recent rx for ambien and xanax were filled 10/08/21, # 30 and #60 respectively, rx by me. Her tramadol is rx'd by Dr. KAlfonso Ramus No red flags.  Past Medical History:  Diagnosis Date   [place]    Allergic rhinitis    Anxiety and depression    Arthritis of knee    bilat, CPPD+ OA   Asthma    [place]    Choledocholithiasis    COPD ([place]ronic obstructive pulmonary disease) ([place]    Diverticulosis 2015   Sigmoid colon (noted on colonoscopy)   Elevated LFTs    GERD (gastroesophageal reflux disease)    History of adenomatous polyp of colon 04/02/2014   Recall 5 yrs   History of pyelonephritis 12/2019   Hypercholesterolemia    Inflammatory arthritis    +mild elev ESR, Rheum eval 07/2020: rapid resp to prednisone, felt to have inflamm arth + OA and CCPD bilat knees. Methotrexate INJ q week.   Insomnia    Iron  deficiency anemia 04/2021   started iron, hemoccults x 3 NEG   Myalgia, unspecified site    + arthralgias---she is tender EVERYWHERE (summer 2021)->rheum ref   OSA (obstructive sleep apnea) 08/2021   2022 sleep study (Dr. DBrett Fairy severe, with oxygen nadir of 73%. CPAP.   Osteoporosis    Perforation of cecum    perf'd in context of SBO that followed GB surgery; no colon removed.   Ventral hernia     Past Surgical History:  Procedure Laterality Date   BREAST BIOPSY  2010   benign cyst   CHOLECYSTECTOMY     COLONOSCOPY  04/02/2014   2015 adenoma x 2->recall 5 yrs ([facility])   Iliostomy     +  takedown   MOUTH SURGERY     NOSE SURGERY     PARTIAL HYSTERECTOMY  1983   perfor colon repair     SBO after gallbladder surgery   SALPINGOOPHORECTOMY Bilateral 05/10/2014   TONSILLECTOMY     VENTRAL HERNIA REPAIR  2016   open, with mesh    Outpatient Medications Prior to Visit  Medication Sig Dispense Refill   albuterol (VENTOLIN HFA) 108 (90 Base) MCG/ACT inhaler Inhale 2 puffs into the lungs every 6 (six) hours as needed for wheezing or shortness of breath. 8 g 6  B-D TB SYRINGE 1CC/27GX1/2" 27G X 1/2" 1 ML MISC Inject into the skin.     cyclobenzaprine (FLEXERIL) 10 MG tablet TAKE ONE TABLET (10 MG DOSE) BY MOUTH 2 (TWO) TIMES A DAY AS NEEDED FOR MUSCLE SPASMS. 180 tablet 1   FERROUS SULFATE PO Take 325 mg by mouth daily.     FLUoxetine (PROZAC) 40 MG capsule Take 1 capsule (40 mg total) by mouth daily. 90 capsule 3   Glycopyrrolate-Formoterol (BEVESPI AEROSPHERE) 9-4.8 MCG/ACT AERO Take 2 puffs by mouth in the morning and at bedtime. 32.1 each 1   leucovorin (WELLCOVORIN) 5 MG tablet Take 5 mg by mouth daily.     meloxicam (MOBIC) 15 MG tablet Take 15 mg by mouth daily.     methotrexate 250 MG/10ML injection 0.60m     pantoprazole (PROTONIX) 40 MG tablet Take 1 tablet (40 mg total) by mouth 2 (two) times daily. 180 tablet 3   pravastatin (PRAVACHOL) 10 MG tablet TAKE 1 TABLET  BY MOUTH EVERY DAY 90 tablet 1   traMADol (ULTRAM) 50 MG tablet Take 50 mg by mouth 4 (four) times daily as needed.     valACYclovir (VALTREX) 500 MG tablet TAKE 1 TABLET BY MOUTH THEN TAKE 1 TABLET 12 HOURS LATER     ALPRAZolam (XANAX) 0.5 MG tablet Take 1 tablet (0.5 mg total) by mouth 3 (three) times daily as needed. 90 tablet 5   zolpidem (AMBIEN) 10 MG tablet Take 1 tablet (10 mg total) by mouth at bedtime as needed. 30 tablet 5   azithromycin (ZITHROMAX Z-PAK) 250 MG tablet Take as directed 6 each 0   Methotrexate Sodium (METHOTREXATE, PF,) 50 MG/2ML injection 7 mL     predniSONE (DELTASONE) 10 MG tablet 4 x 3 days, 3 x 3 days, 2 x 3 days, 1 x 3 days, then stop (Patient not taking: Reported on 10/28/2021) 30 tablet 0   No facility-administered medications prior to visit.    Allergies  Allergen Reactions   Penicillins Anaphylaxis and Rash   Sulfa Antibiotics Anaphylaxis and Rash    ROS As per HPI  PE: Vitals with BMI 10/30/2021 10/28/2021 10/10/2021  Height _0  _1  _2   Weight 222 lbs 13 oz 221 lbs 225 lbs 10 oz  BMI 38.22 [phone] Systolic [id number] Diastolic 68 67 70  Pulse 75 70 77   Physical Exam Constitutional:      Appearance: Normal appearance.  Pulmonary:     Effort: Pulmonary effort is normal.     Breath sounds: Normal breath sounds.  Musculoskeletal:        General: Tenderness present.     Comments: Diffuse tenderness in arms, back, and legs  Neurological:     Mental Status: She is alert.  Psychiatric:        Mood and Affect: Mood normal.        Behavior: Behavior normal.     LABS:  Lab Results  Component Value Date   TSH 2.05 04/25/2021   Lab Results  Component Value Date   WBC 6.1 04/25/2021   HGB 11.8 (L) 04/25/2021   HCT 36.8 04/25/2021   MCV 87.1 04/25/2021   PLT 289.0 04/25/2021   Lab Results  Component Value Date   IRON 129 06/03/2021   TIBC 430 06/03/2021   FERRITIN 23 06/03/2021   Lab Results  Component Value Date    CREATININE 0.87 04/25/2021   BUN 14 04/25/2021   NA 137 04/25/2021  K 4.1 04/25/2021   CL 101 04/25/2021   CO2 23 04/25/2021   Lab Results  Component Value Date   ALT 35 04/25/2021   AST 30 04/25/2021   ALKPHOS 90 04/25/2021   BILITOT 0.3 04/25/2021   Lab Results  Component Value Date   CHOL 184 04/25/2021   Lab Results  Component Value Date   HDL 53.90 04/25/2021   No results found for: [facility] Lab Results  Component Value Date   TRIG 248.0 (H) 04/25/2021   Lab Results  Component Value Date   CHOLHDL 3 04/25/2021    IMPRESSION AND PLAN: Non-toxic appearing female who presents for chronic illness f/u  1) Recurrent MDD/dysthymia, GAD: Symptoms have increased since a death in the family; however, her status is stable given that the patient feels that she is able to handle her mood changes. She is still able to cope with her other stressors. No med changes at this time: continue fluox 40 qd and alpraz 0.57m tid. She'll remain on ambien 118mqhs for insomnia as well.   2) IDA: Stable - iron studies from October unremarkable. Will recheck iron panel today. Continue oral iron 325 mg daily. No concerns for blood loss given hemoccult is negative.    3) Excessive daytime sleepiness: Improved. Patient reports feeling less since using her CPAP in October.   4) Fibromyalgia: Stable.  Of note, normal CK level in the past.  Continue cyclobenzaprine 10 mg prn.  She is on tramadol most nights, rx'd by Dr. KrOpal Sidles An After Visit Summary was printed and given to the patient.  Lab Orders         Drug Monitoring Panel [id number] Urine         Comprehensive metabolic panel         Lipid panel         CBC         Iron, TIBC and Ferritin Panel      FOLLOW UP: Return in about 6 months (around 04/30/2022) for annual CPE (fasting).  Next cpe 04/2022  HePhil [id number]  Signed:  PhCrissie SicklesMD           10/30/2021

## 2021-10-31 MED ORDER — VALACYCLOVIR HCL 500 MG PO TABS: ORAL_TABLET | ORAL | 6 refills | Status: DC

## 2021-10-31 NOTE — Telephone Encounter (Signed)
New acyclovir rx sent in.  Pt will likely have to call pharmacy to request that it be filled.

## 2021-10-31 NOTE — Telephone Encounter (Signed)
Pt advised refill sent. °

## 2021-11-03 ENCOUNTER — Other Ambulatory Visit: Payer: Self-pay | Admitting: Family Medicine

## 2021-11-04 ENCOUNTER — Telehealth: Payer: Self-pay | Admitting: Family Medicine

## 2021-11-04 LAB — DRUG MONITORING PANEL 376104, URINE
Alphahydroxyalprazolam: 70 ng/mL — ABNORMAL HIGH (ref <25)
Alphahydroxymidazolam: NEGATIVE ng/mL (ref <50)
Alphahydroxytriazolam: NEGATIVE ng/mL (ref <50)
Aminoclonazepam: NEGATIVE ng/mL (ref <25)
Amphetamines: NEGATIVE ng/mL (ref <500)
Barbiturates: NEGATIVE ng/mL (ref <300)
Benzodiazepines: POSITIVE ng/mL — AB (ref <100)
Cocaine Metabolite: NEGATIVE ng/mL (ref <150)
Desmethyltramadol: 1365 ng/mL — ABNORMAL HIGH (ref <100)
Hydroxyethylflurazepam: NEGATIVE ng/mL (ref <50)
Lorazepam: NEGATIVE ng/mL (ref <50)
Nordiazepam: NEGATIVE ng/mL (ref <50)
Opiates: NEGATIVE ng/mL (ref <100)
Oxazepam: NEGATIVE ng/mL (ref <50)
Oxycodone: NEGATIVE ng/mL (ref <100)
Temazepam: NEGATIVE ng/mL (ref <50)
Tramadol: >10000 ng/mL — ABNORMAL HIGH (ref <100)

## 2021-11-04 LAB — IRON,TIBC AND FERRITIN PANEL
%SAT: 23 % (calc) (ref 16–45)
Ferritin: 45 ng/mL (ref 16–232)
Iron: 92 ug/dL (ref 45–160)
TIBC: 402 mcg/dL (calc) (ref 250–450)

## 2021-11-04 LAB — DM TEMPLATE

## 2021-11-04 NOTE — Telephone Encounter (Signed)
Error below -  Olivia Mckee left voicemail returning call for lab results

## 2021-11-04 NOTE — Telephone Encounter (Signed)
Olivia Mckee, Olivia Mckee  11/04/2021  8:47 AM EST     Spoke with pt regarding results/recommendations,voiced understanding.

## 2021-11-04 NOTE — Telephone Encounter (Deleted)
Pt left voicemail requesting medication for cough and antibiotic be sent in. She said the cough won't go away & has talked to Dr about 12/8. Pt states this morning also had low grade fever 100.1.  Call back # [phone]

## 2021-11-27 ENCOUNTER — Ambulatory Visit: Payer: Commercial Managed Care - PPO | Admitting: Family Medicine

## 2021-11-28 ENCOUNTER — Other Ambulatory Visit: Payer: Self-pay | Admitting: Family Medicine

## 2021-12-24 ENCOUNTER — Other Ambulatory Visit: Payer: Self-pay | Admitting: Family Medicine

## 2022-01-15 ENCOUNTER — Telehealth: Payer: Self-pay | Admitting: Emergency Medicine

## 2022-01-15 MED ORDER — DOXYCYCLINE HYCLATE 100 MG PO TABS: 100.0000 mg | ORAL_TABLET | Freq: Two times a day (BID) | ORAL | 0 refills | Status: DC

## 2022-01-15 MED ORDER — PREDNISONE 10 MG PO TABS: ORAL_TABLET | ORAL | 0 refills | Status: DC

## 2022-01-15 NOTE — Telephone Encounter (Signed)
Patient is aware of recommendations and voiced her understanding.  Prednisone and doxycycline has been sent to preferred pharmacy. Nothing further needed.

## 2022-01-15 NOTE — Telephone Encounter (Signed)
Spoke to patient.  She feels that she is currently having a COPD flare.  C/o prod cough with yellow sputum, wheezing,headache,  increased sob with exertion, chills, sweats and low grade of 99 yesterday. Sx started on Sunday. two negative covid test. She used Bevespi BID and albuterol HFA BID. Fully vaccinated against covid and flu.  Dr. Lamonte Sakai, please advise. Thanks  Current Outpatient Medications on File Prior to Visit  Medication Sig Dispense Refill   albuterol (VENTOLIN HFA) 108 (90 Base) MCG/ACT inhaler Inhale 2 puffs into the lungs every 6 (six) hours as needed for wheezing or shortness of breath. 8 g 6   ALPRAZolam (XANAX) 0.5 MG tablet Take 1 tablet (0.5 mg total) by mouth 3 (three) times daily as needed. 90 tablet 5   B-D TB SYRINGE 1CC/27GX1/2" 27G X 1/2" 1 ML MISC Inject into the skin.     cyclobenzaprine (FLEXERIL) 10 MG tablet TAKE ONE TABLET BY MOUTH 2 TIMES A DAY AS NEEDED FOR MUSCLE SPASMS. 180 tablet 1   FERROUS SULFATE PO Take 325 mg by mouth daily.     FLUoxetine (PROZAC) 40 MG capsule Take 1 capsule (40 mg total) by mouth daily. 90 capsule 3   Glycopyrrolate-Formoterol (BEVESPI AEROSPHERE) 9-4.8 MCG/ACT AERO Take 2 puffs by mouth in the morning and at bedtime. 32.1 each 1   leucovorin (WELLCOVORIN) 5 MG tablet Take 5 mg by mouth daily.     meloxicam (MOBIC) 15 MG tablet Take 15 mg by mouth daily.     methotrexate 250 MG/10ML injection 0.52ml     pantoprazole (PROTONIX) 40 MG tablet Take 1 tablet (40 mg total) by mouth 2 (two) times daily. 180 tablet 3   pravastatin (PRAVACHOL) 10 MG tablet TAKE 1 TABLET BY MOUTH EVERY DAY 90 tablet 1   traMADol (ULTRAM) 50 MG tablet Take 50 mg by mouth 4 (four) times daily as needed.     valACYclovir (VALTREX) 500 MG tablet TAKE 1 TABLET BY MOUTH THEN TAKE 1 TABLET 12 HOURS LATER 30 tablet 6   zolpidem (AMBIEN) 10 MG tablet Take 1 tablet (10 mg total) by mouth at bedtime as needed. 30 tablet 5   No current facility-administered medications  on file prior to visit.    Allergies  Allergen Reactions   Penicillins Anaphylaxis and Rash   Sulfa Antibiotics Anaphylaxis and Rash

## 2022-01-15 NOTE — Telephone Encounter (Signed)
Please have her start doxycycline 100 mg twice daily for 7 days Prednisone > Take 40mg  daily for 3 days, then 30mg  daily for 3 days, then 20mg  daily for 3 days, then 10mg  daily for 3 days, then stop She is to call us next week to be seen in office if she is not getting better

## 2022-01-19 ENCOUNTER — Ambulatory Visit: Payer: Commercial Managed Care - PPO | Admitting: Family Medicine

## 2022-01-20 ENCOUNTER — Ambulatory Visit (INDEPENDENT_AMBULATORY_CARE_PROVIDER_SITE_OTHER): Payer: Commercial Managed Care - PPO

## 2022-01-20 ENCOUNTER — Ambulatory Visit (INDEPENDENT_AMBULATORY_CARE_PROVIDER_SITE_OTHER): Payer: Commercial Managed Care - PPO | Admitting: Pulmonary Disease

## 2022-01-20 ENCOUNTER — Encounter: Payer: Self-pay | Admitting: Pulmonary Disease

## 2022-01-20 ENCOUNTER — Telehealth: Payer: Self-pay | Admitting: Emergency Medicine

## 2022-01-20 ENCOUNTER — Other Ambulatory Visit: Payer: Self-pay

## 2022-01-20 ENCOUNTER — Telehealth: Payer: Self-pay | Admitting: Pulmonary Disease

## 2022-01-20 VITALS — BP 124/86 | HR 84 | Temp 98.0°F | Ht 64.0 in | Wt 218.0 lb

## 2022-01-20 DIAGNOSIS — R0602 Shortness of breath: Secondary | ICD-10-CM

## 2022-01-20 DIAGNOSIS — J441 Chronic obstructive pulmonary disease with (acute) exacerbation: Secondary | ICD-10-CM

## 2022-01-20 DIAGNOSIS — J181 Lobar pneumonia, unspecified organism: Secondary | ICD-10-CM | POA: Diagnosis not present

## 2022-01-20 MED ORDER — PREDNISONE 10 MG PO TABS: ORAL_TABLET | ORAL | 0 refills | Status: DC

## 2022-01-20 MED ORDER — ALBUTEROL SULFATE (2.5 MG/3ML) 0.083% IN NEBU: 2.5000 mg | INHALATION_SOLUTION | Freq: Four times a day (QID) | RESPIRATORY_TRACT | 12 refills | Status: DC | PRN

## 2022-01-20 MED ORDER — MONTELUKAST SODIUM 10 MG PO TABS: 10.0000 mg | ORAL_TABLET | Freq: Every day | ORAL | 11 refills | Status: DC

## 2022-01-20 MED ORDER — FLUTICASONE PROPIONATE 50 MCG/ACT NA SUSP: 1.0000 | Freq: Every day | NASAL | 2 refills | Status: DC

## 2022-01-20 NOTE — Telephone Encounter (Signed)
Patient scheduled to see Dr Erin Fulling today at 11:45am, with Stat CXR.   Nothing further needed.

## 2022-01-20 NOTE — Telephone Encounter (Signed)
Called and spoke to patient medication was handled nothing further needed.

## 2022-01-20 NOTE — Patient Instructions (Signed)
Your chest x-ray is concerning for possible pneumonia  Start prednisone taper: 40mg  x 3 days 30mg  x 3 days 20mg  x 3 days 10mg  x 3 days  Start montelukast 10mg  daily for allergies and your breathing  Continue taking claritin generic allergy medicine  Use flonase nasal spray, 2 sprays per nostril daily  Complete course of doxycyline

## 2022-01-20 NOTE — Telephone Encounter (Signed)
Dr Lamonte Sakai please advise:   Patient states you treated her 2 weeks ago with prednisone and doxycycline. Patient states she does not feel any better, she is afraid its turning into pneumonia. I offered her an appt but she declined as she does not want to see a NP.   Patient states she would like another round of prednisone and antibiotic.

## 2022-01-20 NOTE — Telephone Encounter (Signed)
She needs to be seen w a CXR

## 2022-01-20 NOTE — Progress Notes (Signed)
Synopsis: Referred in February 2023 for acute visit, followed by Dr. Lamonte Sakai  Subjective:   PATIENT ID: Olivia Mckee GENDER: female DOB: [birth year], MRN: [id number]  HPI  Chief Complaint  Patient presents with   Acute Visit    Was prescribed doxy 174m and prednisone taper last week. States despite having these medications, she is not feeling any better. Increased SOB.    Olivia Aldamais a 61year old woman, former smoker with COPD, GERD and allergic rhinitis who returns to pulmonary clinic for acute visit.   She reports having a viral gastrointestinal illness a couple of weeks ago where she experienced high fever and diarrhea.  Last week she developed increasing shortness of breath, cough, sputum production and wheezing.  She called into our clinic on 2/23 and was prescribed doxycycline 100 mg twice daily for 7 days and started on an extended prednisone taper.  She comes to clinic today with minimal improvement in her cough, shortness of breath and wheezing.  Chest radiograph shows right lower lobe infiltrate.  She reports testing negative for COVID x2 via home testing.  She complains of seasonal allergies and is on Claritin daily.  She has sinus congestion and drainage.  She is not currently using any nasal sprays.  She has history of rheumatoid arthritis and is currently on methotrexate.  Past Medical History:  Diagnosis Date   [place]    Allergic rhinitis    Anxiety and depression    Arthritis of knee    bilat, CPPD+ OA   Asthma    [place]    Choledocholithiasis    [place]    Diverticulosis 2015   Sigmoid colon (noted on colonoscopy)   Elevated LFTs    GERD (gastroesophageal reflux disease)    History of adenomatous polyp of colon 04/02/2014   Recall 5 yrs   History of pyelonephritis 12/2019   Hypercholesterolemia    Inflammatory arthritis    +mild elev ESR, Rheum eval 07/2020: rapid resp to  prednisone, felt to have inflamm arth + OA and CCPD bilat knees. Methotrexate INJ q week.   Insomnia    Iron deficiency anemia 04/2021   started iron, hemoccults x 3 NEG   Myalgia, unspecified site    + arthralgias---she is tender EVERYWHERE (summer 2021)->rheum ref   OSA (obstructive sleep apnea) 08/2021   2022 sleep study (Dr. DBrett Fairy severe, with oxygen nadir of 73%. CPAP.   Osteoporosis    Perforation of cecum    perf'd in context of SBO that followed GB surgery; no colon removed.   Ventral hernia      Family History  Problem Relation Age of Onset   Diabetes Mother    Hypertension Mother    Hearing loss Mother    Depression Mother    Diabetes Father    Hearing loss Father    Early death Father    Stroke Maternal Grandmother    Kidney disease Maternal Grandmother    Diabetes Maternal Grandmother    Heart disease Maternal Grandmother    Arthritis Maternal Grandmother    Stroke Maternal Grandfather    Prostate cancer Maternal Grandfather    Diabetes Maternal Grandfather    Heart disease Maternal Grandfather    Arthritis Maternal Grandfather    Alcohol abuse Maternal Grandfather    Depression Maternal Grandfather    Diabetes Paternal Grandmother    Alcohol abuse Paternal Grandmother    Diabetes Paternal Grandfather    Alcohol abuse Paternal  Grandfather    Heart disease Paternal Grandfather      Social History   Socioeconomic History   Marital status: Married    Spouse name: Olivia Mckee   Number of children: 1   Years of education: Not on file   Highest education level: GED or equivalent  Occupational History   Not on file  Tobacco Use   Smoking status: Former    Packs/day: 1.00    Years: 25.00    Pack years: 25.00    Types: Cigarettes    Quit date: 11/23/2010    Years since quitting: 11.1   Smokeless tobacco: Never  Substance and Sexual Activity   Alcohol use: No   Drug use: No   Sexual activity: Not on file  Other Topics Concern   Not on file  Social  History Narrative   Married, 1 daughter, several grandkids (2 of whom pt is raising).   Educ: some college   Occup: retired Development worker, [employer].   Tob: 20 pack-yr hx, quit approx 2005.   Alc: hx of alcoholism, quit 2018.   No hx of drug abuse.      left handed   Caffeine: 2 12oz can a day   Social Determinants of Radio broadcast assistant Strain: Low Risk    Difficulty of Paying Living Expenses: Not very hard  Food Insecurity: No Food Insecurity   Worried About Charity fundraiser in the Last Year: Never true   Ran Out of Food in the Last Year: Never true  Transportation Needs: No Transportation Needs   Lack of Transportation (Medical): No   Lack of Transportation (Non-Medical): No  Physical Activity: Insufficiently Active   Days of Exercise per Week: 3 days   Minutes of Exercise per Session: 30 min  Stress: Stress Concern Present   Feeling of Stress : Very much  Social Connections: Moderately Isolated   Frequency of Communication with Friends and Family: More than three times a week   Frequency of Social Gatherings with Friends and Family: Never   Attends Religious Services: Never   Marine scientist or Organizations: No   Attends Music therapist: Not on file   Marital Status: Married  Human resources officer Violence: Not on file     Allergies  Allergen Reactions   Penicillins Anaphylaxis and Rash   Sulfa Antibiotics Anaphylaxis and Rash     Outpatient Medications Prior to Visit  Medication Sig Dispense Refill   albuterol (VENTOLIN HFA) 108 (90 Base) MCG/ACT inhaler Inhale 2 puffs into the lungs every 6 (six) hours as needed for wheezing or shortness of breath. 8 g 6   ALPRAZolam (XANAX) 0.5 MG tablet Take 1 tablet (0.5 mg total) by mouth 3 (three) times daily as needed. 90 tablet 5   B-D TB SYRINGE 1CC/27GX1/2" 27G X 1/2" 1 ML MISC Inject into the skin.     cyclobenzaprine (FLEXERIL) 10 MG tablet TAKE ONE TABLET BY MOUTH 2 TIMES A DAY AS NEEDED FOR MUSCLE  SPASMS. 180 tablet 1   doxycycline (VIBRA-TABS) 100 MG tablet Take 1 tablet (100 mg total) by mouth 2 (two) times daily. 14 tablet 0   FERROUS SULFATE PO Take 325 mg by mouth daily.     FLUoxetine (PROZAC) 40 MG capsule Take 1 capsule (40 mg total) by mouth daily. 90 capsule 3   Glycopyrrolate-Formoterol (BEVESPI AEROSPHERE) 9-4.8 MCG/ACT AERO Take 2 puffs by mouth in the morning and at bedtime. 32.1 each 1   leucovorin (WELLCOVORIN) 5 MG  tablet Take 5 mg by mouth daily.     meloxicam (MOBIC) 15 MG tablet Take 15 mg by mouth daily.     methotrexate 250 MG/10ML injection 0.20m     pantoprazole (PROTONIX) 40 MG tablet Take 1 tablet (40 mg total) by mouth 2 (two) times daily. 180 tablet 3   pravastatin (PRAVACHOL) 10 MG tablet TAKE 1 TABLET BY MOUTH EVERY DAY 90 tablet 1   predniSONE (DELTASONE) 10 MG tablet 4tabx3d, 3tabx3d, 2tabx3d, 1tabx3d 30 tablet 0   traMADol (ULTRAM) 50 MG tablet Take 50 mg by mouth 4 (four) times daily as needed.     valACYclovir (VALTREX) 500 MG tablet TAKE 1 TABLET BY MOUTH THEN TAKE 1 TABLET 12 HOURS LATER 30 tablet 6   zolpidem (AMBIEN) 10 MG tablet Take 1 tablet (10 mg total) by mouth at bedtime as needed. 30 tablet 5   No facility-administered medications prior to visit.    Review of Systems  Constitutional:  Positive for malaise/fatigue. Negative for chills, fever and weight loss.  HENT:  Positive for congestion. Negative for sinus pain and sore throat.   Eyes: Negative.   Respiratory:  Positive for cough, sputum production, shortness of breath and wheezing. Negative for hemoptysis.   Cardiovascular:  Negative for chest pain, palpitations, orthopnea, claudication and leg swelling.  Gastrointestinal:  Negative for abdominal pain, heartburn, nausea and vomiting.  Genitourinary: Negative.   Musculoskeletal:  Negative for joint pain and myalgias.  Skin:  Negative for rash.  Neurological:  Negative for weakness.  Endo/Heme/Allergies: Negative.    Psychiatric/Behavioral: Negative.       Objective:   Vitals:   01/20/22 1140  BP: 124/86  Pulse: 84  Temp: 98 F (36.7 C)  TempSrc: Oral  SpO2: 96%  Weight: 218 lb (98.9 kg)  Height: 5' 4"  (16.269m)     Physical Exam Constitutional:      General: She is not in acute distress.    Appearance: She is ill-appearing.  HENT:     Head: Normocephalic and atraumatic.  Eyes:     General: No scleral icterus.    Conjunctiva/sclera: Conjunctivae normal.     Pupils: Pupils are equal, round, and reactive to light.  Cardiovascular:     Rate and Rhythm: Normal rate and regular rhythm.     Pulses: Normal pulses.     Heart sounds: Normal heart sounds. No murmur heard. Pulmonary:     Effort: Pulmonary effort is normal.     Breath sounds: Wheezing present. No rhonchi or rales.  Abdominal:     General: Bowel sounds are normal.     Palpations: Abdomen is soft.  Musculoskeletal:     Right lower leg: No edema.     Left lower leg: No edema.  Lymphadenopathy:     Cervical: No cervical adenopathy.  Skin:    General: Skin is warm and dry.  Neurological:     General: No focal deficit present.     Mental Status: She is alert.  Psychiatric:        Mood and Affect: Mood normal.        Behavior: Behavior normal.        Thought Content: Thought content normal.        Judgment: Judgment normal.    CBC    Component Value Date/Time   WBC 5.0 10/30/2021 0850   RBC 4.50 10/30/2021 0850   HGB 14.0 10/30/2021 0850   HCT 43.1 10/30/2021 0850   PLT 299.0 10/30/2021 0850  MCV 95.8 10/30/2021 0850   MCHC 32.6 10/30/2021 0850   RDW 15.6 (H) 10/30/2021 0850   LYMPHSABS 1.3 04/25/2021 0831   MONOABS 0.4 04/25/2021 0831   EOSABS 0.2 04/25/2021 0831   BASOSABS 0.0 04/25/2021 0831   BMP Latest Ref Rng & Units 10/30/2021 04/25/2021 05/30/2020  Glucose 70 - 99 mg/dL 87 97 112(H)  BUN 6 - 23 mg/dL 12 14 20   Creatinine 0.40 - 1.20 mg/dL 0.87 0.87 0.94  Sodium 135 - 145 mEq/L 137 137 138  Potassium  3.5 - 5.1 mEq/L 4.2 4.1 4.1  Chloride 96 - 112 mEq/L 102 101 106  CO2 19 - 32 mEq/L 26 23 23   Calcium 8.4 - 10.5 mg/dL 10.0 9.3 9.8     Chest imaging: CXR 01/20/22 Patchy opacity projecting over the right lower lobe on the frontal projection could reflect infection in the correct clinical setting. Recommend follow-up radiographs in 3-4 weeks to assess for resolution.  PFT: PFT Results Latest Ref Rng & Units 10/21/2017  FVC-Pre L 2.56  FVC-Predicted Pre % 84  FVC-Post L 2.64  FVC-Predicted Post % 87  Pre FEV1/FVC % % 70  Post FEV1/FCV % % 70  FEV1-Pre L 1.79  FEV1-Predicted Pre % 75  FEV1-Post L 1.85  DLCO uncorrected ml/min/mmHg 16.57  DLCO UNC% % 81  DLCO corrected ml/min/mmHg 17.69  DLCO COR %Predicted % 87  DLVA Predicted % 86  TLC L 4.91  TLC % Predicted % 106  RV % Predicted % 114    Labs:  Path:  Echo:  Heart Catheterization:       Assessment & Plan:   COPD with acute exacerbation (HCC) - Plan: predniSONE (DELTASONE) 10 MG tablet, montelukast (SINGULAIR) 10 MG tablet, fluticasone (FLONASE) 50 MCG/ACT nasal spray, albuterol (PROVENTIL) (2.5 MG/3ML) 0.083% nebulizer solution  Lobar pneumonia, unspecified organism (HCC)  Discussion: Olivia Mckee is a 61 year old woman, former smoker with COPD, GERD and allergic rhinitis who returns to pulmonary clinic for acute visit.   She is having acute COPD exacerbation in setting of right lower lobe pneumonia.  She is to complete her course of doxycycline as prescribed.  We will send in another extended prednisone taper for her COPD exacerbation.  She is to continue Bevespi inhaler therapy.  She is unable to tolerate ICS therapy due to history of thrush.  We will start her on montelukast 10 mg daily for her history of allergies.  She should continue taking Claritin daily.  Recommend that she use Flonase nasal spray 2 sprays per nostril daily.  She is to call our office should she continue to have persistent symptoms  and lack of improvement with the above therapy.  Freda Jackson, MD [facility] Office: [phone]    Current Outpatient Medications:    albuterol (PROVENTIL) (2.5 MG/3ML) 0.083% nebulizer solution, Take 3 mLs (2.5 mg total) by nebulization every 6 (six) hours as needed for wheezing or shortness of breath., Disp: 75 mL, Rfl: 12   albuterol (VENTOLIN HFA) 108 (90 Base) MCG/ACT inhaler, Inhale 2 puffs into the lungs every 6 (six) hours as needed for wheezing or shortness of breath., Disp: 8 g, Rfl: 6   ALPRAZolam (XANAX) 0.5 MG tablet, Take 1 tablet (0.5 mg total) by mouth 3 (three) times daily as needed., Disp: 90 tablet, Rfl: 5   B-D TB SYRINGE 1CC/27GX1/2" 27G X 1/2" 1 ML MISC, Inject into the skin., Disp: , Rfl:    cyclobenzaprine (FLEXERIL) 10 MG tablet, TAKE ONE  TABLET BY MOUTH 2 TIMES A DAY AS NEEDED FOR MUSCLE SPASMS., Disp: 180 tablet, Rfl: 1   doxycycline (VIBRA-TABS) 100 MG tablet, Take 1 tablet (100 mg total) by mouth 2 (two) times daily., Disp: 14 tablet, Rfl: 0   FERROUS SULFATE PO, Take 325 mg by mouth daily., Disp: , Rfl:    FLUoxetine (PROZAC) 40 MG capsule, Take 1 capsule (40 mg total) by mouth daily., Disp: 90 capsule, Rfl: 3   fluticasone (FLONASE) 50 MCG/ACT nasal spray, Place 1 spray into both nostrils daily., Disp: 16 g, Rfl: 2   Glycopyrrolate-Formoterol (BEVESPI AEROSPHERE) 9-4.8 MCG/ACT AERO, Take 2 puffs by mouth in the morning and at bedtime., Disp: 32.1 each, Rfl: 1   leucovorin (WELLCOVORIN) 5 MG tablet, Take 5 mg by mouth daily., Disp: , Rfl:    meloxicam (MOBIC) 15 MG tablet, Take 15 mg by mouth daily., Disp: , Rfl:    methotrexate 250 MG/10ML injection, 0.14m, Disp: , Rfl:    montelukast (SINGULAIR) 10 MG tablet, Take 1 tablet (10 mg total) by mouth at bedtime., Disp: 30 tablet, Rfl: 11   pantoprazole (PROTONIX) 40 MG tablet, Take 1 tablet (40 mg total) by mouth 2 (two) times daily., Disp: 180 tablet, Rfl: 3   pravastatin (PRAVACHOL) 10  MG tablet, TAKE 1 TABLET BY MOUTH EVERY DAY, Disp: 90 tablet, Rfl: 1   predniSONE (DELTASONE) 10 MG tablet, 4tabx3d, 3tabx3d, 2tabx3d, 1tabx3d, Disp: 30 tablet, Rfl: 0   predniSONE (DELTASONE) 10 MG tablet, Take 4 tablets (40 mg total) by mouth daily with breakfast for 3 days, THEN 3 tablets (30 mg total) daily with breakfast for 3 days, THEN 2 tablets (20 mg total) daily with breakfast for 3 days, THEN 1 tablet (10 mg total) daily with breakfast for 3 days., Disp: 30 tablet, Rfl: 0   traMADol (ULTRAM) 50 MG tablet, Take 50 mg by mouth 4 (four) times daily as needed., Disp: , Rfl:    valACYclovir (VALTREX) 500 MG tablet, TAKE 1 TABLET BY MOUTH THEN TAKE 1 TABLET 12 HOURS LATER, Disp: 30 tablet, Rfl: 6   zolpidem (AMBIEN) 10 MG tablet, Take 1 tablet (10 mg total) by mouth at bedtime as needed., Disp: 30 tablet, Rfl: 5

## 2022-01-26 ENCOUNTER — Other Ambulatory Visit: Payer: Self-pay

## 2022-01-26 ENCOUNTER — Ambulatory Visit (INDEPENDENT_AMBULATORY_CARE_PROVIDER_SITE_OTHER): Payer: Commercial Managed Care - PPO | Admitting: Internal Medicine

## 2022-01-26 ENCOUNTER — Encounter: Payer: Self-pay | Admitting: Internal Medicine

## 2022-01-26 VITALS — BP 112/64 | HR 94 | Temp 97.7°F | Ht 64.0 in | Wt 218.0 lb

## 2022-01-26 DIAGNOSIS — J441 Chronic obstructive pulmonary disease with (acute) exacerbation: Secondary | ICD-10-CM

## 2022-01-26 MED ORDER — PREDNISONE 10 MG PO TABS: ORAL_TABLET | ORAL | 0 refills | Status: AC

## 2022-01-26 MED ORDER — BENZONATATE 100 MG PO CAPS: 100.0000 mg | ORAL_CAPSULE | Freq: Four times a day (QID) | ORAL | 1 refills | Status: DC | PRN

## 2022-01-26 NOTE — Patient Instructions (Signed)
Follow up with Dr Lamonte Sakai if not feeling better.  ? ?Take the prednisone taper as prescribed. ? ?Continue Bevespi.  ? ?You can take the albuterol inhaler 2 puffs up to 4 times a day as needed for shortness of breath. ? ?Take benzonatate cough suppressant as needed.  ? ? ?

## 2022-01-26 NOTE — Progress Notes (Signed)
? ?      Olivia Mckee    [id number]    1961/09/19 ? ?Primary Care Physician:McGowen, Adrian Blackwater, MD ?Date of Appointment: 01/26/2022 ?Established Patient Visit ? ?Chief complaint:   ?Chief Complaint  ?Patient presents with  ? Acute Visit  ?  Pt states she still does not feel much better after visit 2/28 when she said she was told she had pna. Pt states she does have pain in her lungs, coughing and states she did get up yellow phlegm. Pt also states she is wheezing.  ? ? ? ?HPI: ?Olivia Mckee is a 61 y.o. woman with COPD - patient of Dr. Lamonte Sakai. Typically requires steroids 1-2 times/year. Last seen last week by Dr. Erin Fulling for acute exacerbation of asthma not managed by prednisone/abx over the phone.  ? ?Interval Updates: ?Feels that she is not much better since last week. ? ?She is on Bevespi inhaler which works well for her.  ?Three weeks ago had sick contact in her grand children with virus.  ?Has already had prednisone taper and abx x2. She is still finishing the prednisone taper that Dr. Erin Fulling gave her. Nasonex is helping her nose - feels pretty dry.  ? ?She has a nebulizer machine and does not feel like it's helping her.  ?The albuterol inhaler treatments do help for a couple of hours.  ? ?There is a pill Dr. Lamonte Sakai gave her (benzonatate?) that helped her cough.  ? ?She continues to have coughing and shortness of breath.  ? ?She is short of breath and can't lay flat on her back. Worse with exertion ? ?No fevers. Oxygen saturations are ok. Appetite is ok. Overall very fatigued such as after making dinner.  ? ?I have reviewed the patient's family social and past medical history and updated as appropriate.  ? ?Past Medical History:  ?Diagnosis Date  ? Alcoholism ([place])   ? Allergic rhinitis   ? Anxiety and depression   ? 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 Asthma   ? Cervical cancer ([place])   ? Choledocholithiasis   ? COPD (chronic obstructive pulmonary disease) ([place])   ? Diverticulosis 2015  ? Sigmoid  colon (noted on colonoscopy)  ? Elevated LFTs   ? GERD (gastroesophageal reflux disease)   ? History of adenomatous polyp of colon 04/02/2014  ? Recall 5 yrs  ? History of pyelonephritis 12/2019  ? Hypercholesterolemia   ? Inflammatory arthritis   ? +mild elev ESR, Rheum eval 07/2020: rapid resp to prednisone, felt to have inflamm arth + OA and CCPD bilat knees. Methotrexate INJ q week.  ? Insomnia   ? Iron deficiency anemia 04/2021  ? started iron, hemoccults x 3 NEG  ? Myalgia, unspecified site   ? + arthralgias---she is tender EVERYWHERE (summer 2021)->rheum ref  ? OSA (obstructive sleep apnea) 08/2021  ? 2022 sleep study (Dr. Brett Fairy) severe, with oxygen nadir of 73%. CPAP.  ? Osteoporosis   ? Perforation of cecum   ? perf'd in context of SBO that followed GB surgery; no colon removed.  ? Ventral hernia   ? ? ?Past Surgical History:  ?Procedure Laterality Date  ? BREAST BIOPSY  2010  ? benign cyst  ? CHOLECYSTECTOMY    ? COLONOSCOPY  04/02/2014  ? 2015 adenoma x 2->recall 5 yrs ([facility])  ? Iliostomy    ? +  takedown  ? MOUTH SURGERY    ? NOSE SURGERY    ? PARTIAL HYSTERECTOMY  1983  ? perfor colon repair    ? SBO after gallbladder surgery  ? SALPINGOOPHORECTOMY Bilateral 05/10/2014  ? TONSILLECTOMY    ? VENTRAL HERNIA REPAIR  2016  ? open, with mesh  ? ? ?Family History  ?Problem Relation Age of Onset  ? Diabetes Mother   ? Hypertension Mother   ? Hearing loss Mother   ? Depression Mother   ? Diabetes Father   ? Hearing loss Father   ? Early death Father   ? Stroke Maternal Grandmother   ? Kidney disease Maternal Grandmother   ? Diabetes Maternal Grandmother   ? Heart disease Maternal Grandmother   ? Arthritis Maternal Grandmother   ? Stroke Maternal Grandfather   ? Prostate cancer Maternal Grandfather   ? Diabetes Maternal Grandfather   ? Heart disease Maternal Grandfather   ? Arthritis Maternal Grandfather   ? Alcohol abuse Maternal Grandfather   ? Depression Maternal Grandfather   ? Diabetes Paternal  Grandmother   ? Alcohol abuse Paternal Grandmother   ? Diabetes Paternal Grandfather   ? Alcohol abuse Paternal Grandfather   ? Heart disease Paternal Grandfather   ? ? ?Social History  ? ?Occupational History  ? Not on file  ?Tobacco Use  ? Smoking status: Former  ?  Packs/day: 1.00  ?  Years: 25.00  ?  Pack years: 25.00  ?  Types: Cigarettes  ?  Quit date: 11/23/2010  ?  Years since quitting: 11.1  ? Smokeless tobacco: Never  ?Substance and Sexual Activity  ? Alcohol use: No  ? Drug use: No  ? Sexual activity: Not on file  ? ? ? ?Physical Exam: ?Blood pressure 112/64, pulse 94, temperature 97.7 ?F (36.5 ?C), temperature source Oral, height 5' 4"  (1.626 m), weight 218 lb (98.9 kg), SpO2 98 %. ? ?Gen:      No acute distress, hoarse voice frequent coughing ?ENT: no nasal polyps, mucus membranes moist ?Lungs:    decreased air entry bilaterally, no wheezes but breath sounds are diminished ?CV:         Regular rate and rhythm; no murmurs, rubs, or gallops.  No pedal edema ? ? ?Data Reviewed: ?Imaging: ?I have personally reviewed the chest xray 01/20/22 possible RLL pna.  ? ?PFTs: ? ?PFT Results Latest Ref Rng & Units 10/21/2017  ?FVC-Pre L 2.56  ?FVC-Predicted Pre % 84  ?FVC-Post L 2.64  ?FVC-Predicted Post % 87  ?Pre FEV1/FVC % % 70  ?Post FEV1/FCV % % 70  ?FEV1-Pre L 1.79  ?FEV1-Predicted Pre % 75  ?FEV1-Post L 1.85  ?DLCO uncorrected ml/min/mmHg 16.57  ?DLCO UNC% % 81  ?DLCO corrected ml/min/mmHg 17.69  ?DLCO COR %Predicted % 87  ?DLVA Predicted % 86  ?TLC L 4.91  ?TLC % Predicted % 106  ?RV % Predicted % 114  ? ?I have personally reviewed the patient's PFTs and mild airflow limitation with hyperinflation and air trapping.  ? ?Labs: ? ?Immunization status: ?Immunization History  ?Administered Date(s) Administered  ? Influenza Split 12/23/2015, 07/31/2016  ? Influenza, Quadrivalent, Recombinant, Inj, Pf 07/28/2019  ? Influenza, Seasonal, Injecte, Preservative Fre 11/13/2014, 12/23/2015, 07/31/2016  ? Influenza,inj,Quad  PF,6+ Mos 09/08/2018, 07/28/2019, 10/10/2021  ? Influenza-Unspecified 11/13/2014, 09/02/2020  ? Moderna SARS-COV2 Booster Vaccination 10/07/2020, 02/20/2021, 09/04/2021  ? Moderna Sars-Covid-2 Vaccination 03/07/2020, 04/04/2020  ? PNEUMOCOCCAL CONJUGATE-20 04/24/2021  ? Pneumococcal Polysaccharide-23 03/25/2017, 03/28/2020  ? Tdap 11/28/2013  ? Unspecified SARS-COV-2 Vaccination 02/05/2020, 05/07/2020, 11/06/2020, 02/04/2021  ? Zoster Recombinat (Shingrix) 07/28/2019, 07/28/2019, 11/22/2019  ? ? ?  External Records Personally Reviewed: previous notes from pulmonary ? ?Assessment:  ?COPD with exacerbation, not resolving.  ? ?Plan/Recommendations: ?Will extend predisone taper. Suspect viral trigger.  Do not think another course of abx will help  ?Will prescribe benzonatate lozenges ?Will hold off on chlor tabs since she feels pretty dried out from the intranasal steroid for now.  ?Additional imaging unlikely to be helpful since she just had an xray last week.  ? ? ?Return to Care: ?She would need to follow up with Dr Lamonte Sakai if not improving at this point.  ? ? ?Lenice Llamas, MD ?Pulmonary and Critical Care Medicine ?[place] ?Office:[phone] ? ? ? ? ? ?

## 2022-02-02 ENCOUNTER — Telehealth: Payer: Self-pay | Admitting: Emergency Medicine

## 2022-02-02 MED ORDER — DOXYCYCLINE HYCLATE 100 MG PO TABS: 100.0000 mg | ORAL_TABLET | Freq: Two times a day (BID) | ORAL | 0 refills | Status: DC

## 2022-02-02 NOTE — Telephone Encounter (Signed)
Spoke with the pt  ?Notified of recs per RB ?I have sent rx for Doxy  ?She will call for appt if not improving ?

## 2022-02-02 NOTE — Telephone Encounter (Signed)
Called and spoke with patient who is calling because she's having coughing spells, she's coughing up green and brown mucus. Wants to know what Dr. Lamonte Sakai thinks she should do. She has seen Dr. Erin Fulling and Dr. Shearon Stalls but is not feeling any better and now her cough is productive and her voice is going in and out.  ? ?Dr. Lamonte Sakai please advise ?

## 2022-02-02 NOTE — Telephone Encounter (Signed)
Called the pt and there was no answer- LMTCB    

## 2022-02-02 NOTE — Telephone Encounter (Signed)
She has been treated for flare with serial runs steroids and also abx.  Unclear whether repeat abx will offer any benefit here, but ok to try. Would have her take doxycycline '100mg'$  bid x 7 days. The persistent sx would argue against an acute infection. I believe she will probably need to be seen in office again to trouble shoot.  ?

## 2022-02-06 ENCOUNTER — Telehealth: Payer: Self-pay | Admitting: Emergency Medicine

## 2022-02-06 MED ORDER — FLUCONAZOLE 150 MG PO TABS: 150.0000 mg | ORAL_TABLET | Freq: Every day | ORAL | 0 refills | Status: DC

## 2022-02-06 NOTE — Telephone Encounter (Signed)
Called and spoke with patient. She stated that she normally develops a yeast infection about several rounds of antibiotics and asks for Diflucan but forgot at her last office visit. She developed a yeast infection yesterday.  ? ?RB is off today and ND is working nights.  ? ?Dr. Annamaria Boots, can you please advise?  ? ?[facility] [place].  ?

## 2022-02-06 NOTE — Telephone Encounter (Signed)
Diflucan 150 mg, # 7, 1 daily 

## 2022-02-06 NOTE — Telephone Encounter (Signed)
Called and spoke with patient. She is aware that [place] has approved the Diflucan RX. RX has been sent in for her.  ? ?Nothing further needed at time of call.  ?

## 2022-02-12 ENCOUNTER — Telehealth: Payer: Self-pay | Admitting: Emergency Medicine

## 2022-02-12 NOTE — Telephone Encounter (Signed)
Spoke with the pt ?She states cost of Bevespi went up to $1200  ?She wants to know if there is something else she can try  ?She has not tried Librarian, academic and we have samples of that as well as $0 copay cards that I believe she could use with her  [facility] insurance ? ?Please advise thanks ?

## 2022-02-13 MED ORDER — BREZTRI AEROSPHERE 160-9-4.8 MCG/ACT IN AERO: 2.0000 | INHALATION_SPRAY | Freq: Two times a day (BID) | RESPIRATORY_TRACT | 0 refills | Status: DC

## 2022-02-13 NOTE — Telephone Encounter (Signed)
I would be ok changing to breztri and using the coupon. She needs to be sure to rinse/gargle after taking ?

## 2022-02-13 NOTE — Telephone Encounter (Signed)
Called and spoke with patient. Breztri was sent in to her pharmacy. Nothing further needed.  ?

## 2022-02-18 ENCOUNTER — Telehealth: Payer: Self-pay | Admitting: Emergency Medicine

## 2022-02-18 NOTE — Telephone Encounter (Signed)
Called and LVM  in regards to patients concerns about breztri and Bird.  Will try again later.  ?

## 2022-03-10 ENCOUNTER — Other Ambulatory Visit: Payer: Self-pay | Admitting: Emergency Medicine

## 2022-03-12 ENCOUNTER — Telehealth: Payer: Self-pay | Admitting: Emergency Medicine

## 2022-03-12 ENCOUNTER — Telehealth: Payer: Self-pay | Admitting: Internal Medicine

## 2022-03-13 MED ORDER — BEVESPI AEROSPHERE 9-4.8 MCG/ACT IN AERO: 2.0000 | INHALATION_SPRAY | Freq: Two times a day (BID) | RESPIRATORY_TRACT | 11 refills | Status: DC

## 2022-03-13 NOTE — Telephone Encounter (Signed)
I am ok changing to bevespi bid, refills for 1 year.  ?

## 2022-03-13 NOTE — Telephone Encounter (Signed)
Spoke with pt who states [facility] r/t it being expired. Pt states she has enough to get her through until end of next month. When it does come time to refill Bevespi pt would like it to be ready to do so. May we please place a order for pt's refill of Bevespi and d/c pt's Breztri as pt states that cost to much. Dr. Lamonte Sakai please advise.  ?

## 2022-03-13 NOTE — Telephone Encounter (Signed)
Called and spoke with patient. She is aware that I will [facility] in the [facility]. RX has been sent.  ? ?Nothing further needed at time of call.  ?

## 2022-03-16 ENCOUNTER — Encounter: Payer: Self-pay | Admitting: Family Medicine

## 2022-03-16 ENCOUNTER — Ambulatory Visit (INDEPENDENT_AMBULATORY_CARE_PROVIDER_SITE_OTHER): Payer: Commercial Managed Care - PPO | Admitting: Family Medicine

## 2022-03-16 VITALS — BP 117/76 | HR 70 | Ht 64.0 in | Wt 223.0 lb

## 2022-03-16 DIAGNOSIS — G4731 Primary central sleep apnea: Secondary | ICD-10-CM | POA: Diagnosis not present

## 2022-03-16 NOTE — Patient Instructions (Addendum)
Please continue using your CPAP regularly. While your insurance requires that you use CPAP at least 4 hours each night on 70% of the nights, I recommend, that you not skip any nights and use it throughout the night if you can. Getting used to CPAP and staying with the treatment long term does take time and patience and discipline. Untreated obstructive sleep apnea when it is moderate to severe can have an adverse impact on cardiovascular health and raise her risk for heart disease, arrhythmias, hypertension, congestive heart failure, stroke and diabetes. Untreated obstructive sleep apnea causes sleep disruption, nonrestorative sleep, and sleep deprivation. This can have an impact on your day to day functioning and cause daytime sleepiness and impairment of cognitive function, memory loss, mood disturbance, and problems focussing. Using CPAP regularly can improve these symptoms. ? ? ?Try taking alprazolam 0.'5mg'$  around 2pm and 4pm then decrease dose of Ambien to '5mg'$  at bedtime to see if this helps with morning sleepiness. Discuss with Dr Anitra Lauth.  ? ? ?Make sure to get your mask sealed correctly when starting therapy. I am going to adjust pressure settings as you are requiring more pressure to manage the apneic events. If mouth continues to stay dry, try talking to the DME about a chin strap. You can try Biotene products over the counter as well.  ? ? ?Follow up with me in 4-6 months  ?

## 2022-03-16 NOTE — Progress Notes (Signed)
Dr Dohmeier's  ? ? ?PATIENT: Olivia Mckee ?DOB: 1961/01/03 ? ?REASON FOR VISIT: follow up ?HISTORY FROM: patient ? ?Chief Complaint  ?Patient presents with  ? Obstructive Sleep Apnea  ?  Rm 13, alone. Here for CPAP f/u. Pt reports last 6 weeks she has not been sleeping well. Waking up tired, not getting enough sleep. Waking up with mask popping off, pt will be getting a new mask.   ? ? ?HISTORY OF PRESENT ILLNESS: ? ?03/16/22 ALL:  ?Olivia Mckee returns for follow up for OSA on CPAP. She continues to use CPAP most every night. She reports not sleeping well. She wakes feeling really tired. She usually takes a nap around 11am for "a couple of hours." She goes to bed around 8:30 and sleeps until around 6-6:30. She has a FFM size medium. She feels that it does not have a good seal at times. She has contacted DME and is expecting a new mask. She is raising her grandsons. She does endorse more stress. She is taking alprazolam 0.43m up to three times a day (usually 160mat 3pm) and Ambien 1063maily around 8:30.  ? ? ? ?10/28/2021 ALL:  ?Olivia Mckee a 61 57o. female here today for follow up for OSA on CPAP.  HST 09/03/2021 showed severe, complex sleep apnea with mostly obstructive events. AHI was 48.8/hr. O2 nadir 73% with 214 minutes of hypoxia noted. She was started on AutoPAP. She reports doing fairly well. She is not dozing off as much during the day. She wakes feeling more refreshed. She continues to follow with rheumatology for rheumatoid arthritis versus fibromyalgia.  ? ? ? ?HISTORY: (copied from Dr Dohmeier's previous note) ? ?Olivia Mckee a 60 90o. year old White or Caucasian female patient seen here as a referral on 07/23/2021 : ?  ?Chief concern according to patient :  " they say I have sleep apnea, I snore all my adult  life". 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 ?I am raising my 2 grandsons. I am worried, overwhelmed at times, I am sleeping a lot'- I am more concerned about memory- "  ?  ?I have the pleasure of  seeing Olivia Mckee, a right-handed White or Caucasian female with a possible sleep disorder.  She has a  has a past medical history of Alcoholism ([place], Anxiety and depression, Asthma, Cervical cancer ([place]oled[place]olithiasis, rheumatoid arthritis, MTX-  ? COPD (chronic obstructive pulmonary disease) ([place] ([place]), Elevated LFTs, GERD (gastroesophageal reflux disease), History of adenomatous polyp of colon (04/02/2014), History of pyelonephritis (12/2019),  ?Hypercholesterolemia, Insomnia, Iron deficiency anemia (04/2021), Osteoporosis, Perforation of cecum, and Ventral hernia..  ?  ?Sleep relevant medical history: Nocturia: RLS, anemia,  Tonsillectomy,  deviated septum nasal reconstruction- ?  ? Family medical /sleep history: mother  with OSA, insomnia, sleep walkers.  ?  ?Social history:  Patient is working as  and lives in a household with husband and 2 grand children- her daughter was a cocaine user- at the time 6 months and age 50. 82?The patient currently is staying home- ?Tobacco use/ quit 15 years ago .  ETOH use / sober for 6 years ,  ?Caffeine intake in form of Coffee( /) Soda( 2 cans in Pm ) Tea ( /) or energy drinks. ?Regular exercise in form of swimming. .  Marland Kitchen?Hobbies : cooking.  ? ?Sleep habits are as follows: The patient's dinner time is between 5-7 PM. The patient goes to bed at 9 PM and continues to  sleep for 2-3 hours, wakes for 1-2 bathroom breaks, the first time at 12 AM.   ?The preferred sleep position is sideways, with the support of 1 pillow.  Bed is raised too .Dreams are reportedly frequent/vivid.  ?5.30  AM is the usual rise time. The patient wakes up spontaneously.  ?He/ She reports not feeling refreshed or restored in AM, with symptoms such as dry mouth, morning headaches, and residual fatigue.  ?Naps are taken frequently, unscheduled, lasting from 15 to 120 minutes and are more refreshing than nocturnal sleep.  ? ? ?REVIEW OF SYSTEMS: Out of a  complete 14 system review of symptoms, the patient complains only of the following symptoms, generalized pain, fatigue and all other reviewed systems are negative. ? ?ESS: 14/24, previously 8/24 ? ?ALLERGIES: ?Allergies  ?Allergen Reactions  ? Penicillins Anaphylaxis and Rash  ? Sulfa Antibiotics Anaphylaxis and Rash  ? ? ?HOME MEDICATIONS: ?Outpatient Medications Prior to Visit  ?Medication Sig Dispense Refill  ? albuterol (PROVENTIL) (2.5 MG/3ML) 0.083% nebulizer solution Take 3 mLs (2.5 mg total) by nebulization every 6 (six) hours as needed for wheezing or shortness of breath. 75 mL 12  ? albuterol (VENTOLIN HFA) 108 (90 Base) MCG/ACT inhaler Inhale 2 puffs into the lungs every 6 (six) hours as needed for wheezing or shortness of breath. 8 g 6  ? ALPRAZolam (XANAX) 0.5 MG tablet Take 1 tablet (0.5 mg total) by mouth 3 (three) times daily as needed. 90 tablet 5  ? B-D TB SYRINGE 1CC/27GX1/2" 27G X 1/2" 1 ML MISC Inject into the skin.    ? benzonatate (TESSALON) 100 MG capsule Take 1 capsule (100 mg total) by mouth every 6 (six) hours as needed for cough. 30 capsule 1  ? cyclobenzaprine (FLEXERIL) 10 MG tablet TAKE ONE TABLET BY MOUTH 2 TIMES A DAY AS NEEDED FOR MUSCLE SPASMS. 180 tablet 1  ? doxycycline (VIBRA-TABS) 100 MG tablet Take 1 tablet (100 mg total) by mouth 2 (two) times daily. 14 tablet 0  ? FERROUS SULFATE PO Take 325 mg by mouth daily.    ? fluconazole (DIFLUCAN) 150 MG tablet Take 1 tablet (150 mg total) by mouth daily. 7 tablet 0  ? FLUoxetine (PROZAC) 40 MG capsule Take 1 capsule (40 mg total) by mouth daily. 90 capsule 3  ? fluticasone (FLONASE) 50 MCG/ACT nasal spray Place 1 spray into both nostrils daily. 16 g 2  ? Glycopyrrolate-Formoterol (BEVESPI AEROSPHERE) 9-4.8 MCG/ACT AERO Take 2 puffs by mouth in the morning and at bedtime. 10.7 g 11  ? leucovorin (WELLCOVORIN) 5 MG tablet Take 5 mg by mouth daily.    ? meloxicam (MOBIC) 15 MG tablet Take 15 mg by mouth daily.    ? methotrexate 250  MG/10ML injection 0.69m    ? montelukast (SINGULAIR) 10 MG tablet Take 1 tablet (10 mg total) by mouth at bedtime. 30 tablet 11  ? pantoprazole (PROTONIX) 40 MG tablet Take 1 tablet (40 mg total) by mouth 2 (two) times daily. 180 tablet 3  ? pravastatin (PRAVACHOL) 10 MG tablet TAKE 1 TABLET BY MOUTH EVERY DAY 90 tablet 1  ? traMADol (ULTRAM) 50 MG tablet Take 50 mg by mouth 4 (four) times daily as needed.    ? valACYclovir (VALTREX) 500 MG tablet TAKE 1 TABLET BY MOUTH THEN TAKE 1 TABLET 12 HOURS LATER 30 tablet 6  ? zolpidem (AMBIEN) 10 MG tablet Take 1 tablet (10 mg total) by mouth at bedtime as needed. 30 tablet 5  ? ?No  facility-administered medications prior to visit.  ? ? ?PAST MEDICAL HISTORY: ?Past Medical History:  ?Diagnosis Date  ? Alcoholism ([place])   ? Allergic rhinitis   ? Anxiety and depression   ? 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 Asthma   ? Cervical cancer ([place])   ? Choledocholithiasis   ? COPD (chronic obstructive pulmonary disease) ([place])   ? Diverticulosis 2015  ? Sigmoid colon (noted on colonoscopy)  ? Elevated LFTs   ? GERD (gastroesophageal reflux disease)   ? History of adenomatous polyp of colon 04/02/2014  ? Recall 5 yrs  ? History of pyelonephritis 12/2019  ? Hypercholesterolemia   ? Inflammatory arthritis   ? +mild elev ESR, Rheum eval 07/2020: rapid resp to prednisone, felt to have inflamm arth + OA and CCPD bilat knees. Methotrexate INJ q week.  ? Insomnia   ? Iron deficiency anemia 04/2021  ? started iron, hemoccults x 3 NEG  ? Myalgia, unspecified site   ? + arthralgias---she is tender EVERYWHERE (summer 2021)->rheum ref  ? OSA (obstructive sleep apnea) 08/2021  ? 2022 sleep study (Dr. Brett Fairy) severe, with oxygen nadir of 73%. CPAP.  ? Osteoporosis   ? Perforation of cecum   ? perf'd in context of SBO that followed GB surgery; no colon removed.  ? Ventral hernia   ? ? ?PAST SURGICAL HISTORY: ?Past Surgical History:  ?Procedure Laterality Date  ? BREAST BIOPSY  2010  ? benign  cyst  ? CHOLECYSTECTOMY    ? COLONOSCOPY  04/02/2014  ? 2015 adenoma x 2->recall 5 yrs ([facility])  ? Iliostomy    ? +  takedown  ? MOUTH SURGERY    ? NOSE SURGERY    ? PARTIAL HYSTERECTOMY  1983  ? perfor

## 2022-04-09 NOTE — Telephone Encounter (Signed)
Noted.  Will close encounter.  

## 2022-04-30 ENCOUNTER — Ambulatory Visit (INDEPENDENT_AMBULATORY_CARE_PROVIDER_SITE_OTHER): Payer: Commercial Managed Care - PPO | Admitting: Family Medicine

## 2022-04-30 ENCOUNTER — Encounter: Payer: Self-pay | Admitting: Family Medicine

## 2022-04-30 VITALS — BP 105/68 | HR 71 | Temp 97.6°F | Ht 64.0 in | Wt 224.0 lb

## 2022-04-30 DIAGNOSIS — M7052 Other bursitis of knee, left knee: Secondary | ICD-10-CM

## 2022-04-30 DIAGNOSIS — E7800 Pure hypercholesterolemia, unspecified: Secondary | ICD-10-CM | POA: Diagnosis not present

## 2022-04-30 DIAGNOSIS — M25562 Pain in left knee: Secondary | ICD-10-CM

## 2022-04-30 DIAGNOSIS — F339 Major depressive disorder, recurrent, unspecified: Secondary | ICD-10-CM | POA: Diagnosis not present

## 2022-04-30 DIAGNOSIS — Z862 Personal history of diseases of the blood and blood-forming organs and certain disorders involving the immune mechanism: Secondary | ICD-10-CM

## 2022-04-30 DIAGNOSIS — Z0000 Encounter for general adult medical examination without abnormal findings: Secondary | ICD-10-CM | POA: Diagnosis not present

## 2022-04-30 LAB — CBC
HCT: 41.5 % (ref 36.0–46.0)
Hemoglobin: 13.8 g/dL (ref 12.0–15.0)
MCHC: 33.4 g/dL (ref 30.0–36.0)
MCV: 99.1 fl (ref 78.0–100.0)
Platelets: 321 10*3/uL (ref 150.0–400.0)
RBC: 4.18 Mil/uL (ref 3.87–5.11)
RDW: 16.2 % — ABNORMAL HIGH (ref 11.5–15.5)
WBC: 5.1 10*3/uL (ref 4.0–10.5)

## 2022-04-30 LAB — LIPID PANEL
Cholesterol: 186 mg/dL (ref 0–200)
HDL: 68.7 mg/dL (ref >39.00)
LDL Cholesterol: 90 mg/dL (ref 0–99)
NonHDL: 117.59
Total CHOL/HDL Ratio: 3
Triglycerides: 138 mg/dL (ref 0.0–149.0)
VLDL: 27.6 mg/dL (ref 0.0–40.0)

## 2022-04-30 LAB — COMPREHENSIVE METABOLIC PANEL
ALT: 26 U/L (ref 0–35)
AST: 18 U/L (ref 0–37)
Albumin: 4.3 g/dL (ref 3.5–5.2)
Alkaline Phosphatase: 84 U/L (ref 39–117)
BUN: 13 mg/dL (ref 6–23)
CO2: 29 mEq/L (ref 19–32)
Calcium: 9.9 mg/dL (ref 8.4–10.5)
Chloride: 102 mEq/L (ref 96–112)
Creatinine, Ser: 0.82 mg/dL (ref 0.40–1.20)
GFR: 77.23 mL/min (ref >60.00)
Glucose, Bld: 85 mg/dL (ref 70–99)
Potassium: 4.4 mEq/L (ref 3.5–5.1)
Sodium: 140 mEq/L (ref 135–145)
Total Bilirubin: 0.3 mg/dL (ref 0.2–1.2)
Total Protein: 6.8 g/dL (ref 6.0–8.3)

## 2022-04-30 MED ORDER — ALPRAZOLAM 0.5 MG PO TABS: 0.5000 mg | ORAL_TABLET | Freq: Three times a day (TID) | ORAL | 5 refills | Status: DC | PRN

## 2022-04-30 MED ORDER — PRAVASTATIN SODIUM 10 MG PO TABS: 10.0000 mg | ORAL_TABLET | Freq: Every day | ORAL | 1 refills | Status: DC

## 2022-04-30 MED ORDER — ZOLPIDEM TARTRATE 10 MG PO TABS: 10.0000 mg | ORAL_TABLET | Freq: Every evening | ORAL | 5 refills | Status: DC | PRN

## 2022-04-30 MED ORDER — VALACYCLOVIR HCL 500 MG PO TABS: ORAL_TABLET | ORAL | 6 refills | Status: DC

## 2022-04-30 MED ORDER — FLUOXETINE HCL 40 MG PO CAPS: 40.0000 mg | ORAL_CAPSULE | Freq: Every day | ORAL | 1 refills | Status: DC

## 2022-04-30 MED ORDER — PANTOPRAZOLE SODIUM 40 MG PO TBEC: 40.0000 mg | DELAYED_RELEASE_TABLET | Freq: Two times a day (BID) | ORAL | 1 refills | Status: DC

## 2022-04-30 MED ORDER — TRIAMCINOLONE ACETONIDE 40 MG/ML IJ SUSP
40.0000 mg | Freq: Once | INTRAMUSCULAR | Status: AC
  Administered 2022-04-30: 40 mg via INTRAMUSCULAR

## 2022-04-30 NOTE — Patient Instructions (Signed)
Pes Anserine Bursitis  The pes anserine is an area on the inside of your knee, just below the joint, where the muscles are attached to the bone by tendons. This area is cushioned by a fluid-filled sac (bursa). Pes anserine bursitis is a condition that happens when the bursa gets swollen and irritated and causes knee pain. What are the causes? This condition may be caused by: Making the same movement over and over. A direct hit (trauma) to the inside of the leg below the knee joint. What increases the risk? You are more likely to develop this condition if you: Are a runner. Play sports that involve a lot of running and quick side-to-side movements (cutting). Are an athlete who plays contact sports. Swim using an inward angle of the knee, such as with the breaststroke. Have tight hamstring muscles. Are a woman. Are overweight. Have flat feet. Have diabetes or osteoarthritis. What are the signs or symptoms? Symptoms of this condition include: Knee pain that gets better with rest and worse with activities like climbing stairs, walking, running, or getting in and out of a chair. Swelling. Warmth. Tenderness when pressing at the inside of the lower leg, just below the knee joint. How is this diagnosed? This condition is usually diagnosed based on: Your symptoms. Your medical history. A physical exam. During your physical exam, your health care provider will press on the tendon attachment to see if you feel pain. Your health care provider will check your hip and knee motion and strength. In rare cases, tests are used to check for swelling and fluid buildup in the bursa and to look at muscles, bones, and tendons. These tests might include: X-rays. MRI. Ultrasound. How is this treated? This condition may be treated by: Resting your knee. You may be told to raise (elevate) your knee while sitting or lying down. Avoiding activities that cause pain. Icing the inside of your knee. Applying  heat to your knee. Wearing an elastic wrap or compression knee sleeve to support your knee. Sleeping with a pillow between your knees. This will cushion your injured knee. Taking medicine to reduce pain and swelling. Getting corticosteroid injections into the knee to reduce pain and swelling. Doing strengthening and stretching exercises (physical therapy). If these treatments do not work or if the condition keeps coming back, you may need to have surgery to remove the bursa. Follow these instructions at home: If you have a removable compression wrap or sleeve: Wear it as told by your health care provider. Remove it only as told by your health care provider. Loosen the wrap or sleeve if your foot or toes tingle, become numb, or turn cold and blue. Keep the wrap or sleeve clean. If the wrap or sleeve is not waterproof: Remove it if allowed by your health care provider. Do not let it get wet. Cover it with a watertight covering when you take a bath or shower if you must wear it. Managing pain, stiffness, and swelling     If directed, put ice on the injured area. Put ice in a plastic bag. Place a towel between your skin and the bag. Leave the ice on for 20 minutes, 2-3 times a day. Remove the ice if your skin turns bright red. This is very important. If you cannot feel pain, heat, or cold, you have a greater risk of damage to the area. Move your toes often to reduce stiffness and swelling. Elevate the injured area above the level of your heart while you   are sitting or lying down. If directed, apply heat to the affected area. Use the heat source that your health care provider recommends, such as a moist heat pack or a heating pad. Place a towel between your skin and the heat source. Leave the heat on for 20-30 minutes. Remove the heat if your skin turns bright red. This is especially important if you are unable to feel pain, heat, or cold. You may have a greater risk of getting  burned. Activity Return to your normal activities as told by your health care provider. Ask your health care provider what activities are safe for you. Do exercises as told by your health care provider and physical therapist. General instructions Take over-the-counter and prescription medicines only as told by your health care provider. Sleep with a pillow between your knees. Do not use any products that contain nicotine or tobacco. These products include cigarettes, chewing tobacco, and vaping devices, such as e-cigarettes. These can delay healing. If you need help quitting, ask your health care provider. If you are overweight, work with your health care provider and a dietitian to set a weight-loss goal that is healthy and reasonable for you. Keep all follow-up visits. This is important. How is this prevented? Warm up and stretch before being active. Cool down and stretch after being active. Give your body time to rest between periods of activity. Use equipment that fits you. Be safe and responsible while being active to avoid falls. Maintain a healthy weight. Maintain physical fitness, including: Strength. Flexibility. Cardiovascular fitness. Endurance. Contact a health care provider if: Your symptoms do not improve. Your symptoms get worse. Summary Pes anserine bursitis is a condition that happens when the fluid-filled sac (bursa) at the inside of your knee gets swollen, irritated, and causes pain. Treatment for pes anserine bursitis may include resting your knee, icing the inside of your knee, sleeping with a pillow between your knees, taking medicine by mouth or by injection, and doing strengthening and stretching exercises (physical therapy). Follow instructions for managing pain, stiffness, and swelling. Take over-the-counter and prescription medicines only as told by your health care provider. This information is not intended to replace advice given to you by your health care  provider. Make sure you discuss any questions you have with your health care provider. Document Revised: 11/04/2021 Document Reviewed: 11/04/2021 Elsevier Patient Education  2023 Elsevier Inc.  

## 2022-04-30 NOTE — Addendum Note (Signed)
Addended by: Deveron Furlong D on: 04/30/2022 10:29 AM   Modules accepted: Orders

## 2022-04-30 NOTE — Progress Notes (Signed)
Office Note 04/30/2022  CC:  Chief Complaint  Patient presents with   Insomnia   Patient is a 61 y.o. female who is here for annual health maintenance exam and 6 mo f/u recurrent major depressive disorder, insomnia, fibromyalgia. A/P as of last visit: "1) Recurrent MDD/dysthymia, GAD: Symptoms have increased since a death in the family; however, her status is stable given that the patient feels that she is able to handle her mood changes. She is still able to cope with her other stressors. No med changes at this time: continue fluox 40 qd and alpraz 0.5mg tid. She'll remain on ambien 10mg qhs for insomnia as well.   2) IDA: Stable - iron studies from October unremarkable. Will recheck iron panel today. Continue oral iron 325 mg daily. No concerns for blood loss given hemoccult is negative.    3) Excessive daytime sleepiness: Improved. Patient reports feeling less since using her CPAP in October.    4) Fibromyalgia: Stable.  Of note, normal CK level in the past.  Continue cyclobenzaprine 10 mg prn.  She is on tramadol most nights, rx'd by Dr. Kramer/ortho."  INTERIM HX: Doing ok, still struggles a lot with chronic widespread musculo pain, being treated by rheum for inflamm arth but also has all features of fibromyalgia syndrome.  Mood is ok, no signif dep, no panic. Chronic anxiety pretty stable overall, no panic.  Takes alpraz. Sleep not restorative.  Takes zolpidem hs.  Wears cpap regularly, is getting a new mask soon.  Has done approp f/u with pulm about this prob and her copd.  About 3-month history of gradually worsening pain in the left knee medially and distal to the joint.  Hurts to touch the area.  Swollen and soft, no redness. The area was not swollen prior to the onset of her pain.  Worse when she gets up and walks on it especially with deep bending, occasionally feels like it may give way due to the pain. She has significant history of left knee joint problems, secondary  osteoarthritis. Has never had the particular kind of knee pain and swelling that she has now.  PMP AWARE reviewed today: most recent rx for alprazolam was filled 04/01/2022, #90, rx by me. Most recent Ambien prescription filled 04/01/2022, #30, prescription by me. Most recent tramadol prescription filled 03/30/2022, prescription by Dr. Kramer. No red flags.  Past Medical History:  Diagnosis Date   Alcoholism (HCC)    Allergic rhinitis    Anxiety and depression    Arthritis of knee    bilat, CPPD+ OA   Asthma    Cervical cancer (HCC)    Choledocholithiasis    COPD (chronic obstructive pulmonary disease) (HCC)    Diverticulosis 2015   Sigmoid colon (noted on colonoscopy)   Elevated LFTs    GERD (gastroesophageal reflux disease)    History of adenomatous polyp of colon 04/02/2014   Recall 5 yrs   History of pyelonephritis 12/2019   Hypercholesterolemia    Inflammatory arthritis    +mild elev ESR, Rheum eval 07/2020: rapid resp to prednisone, felt to have inflamm arth + OA and CCPD bilat knees. Methotrexate INJ q week.   Insomnia    Iron deficiency anemia 04/2021   started iron, hemoccults x 3 NEG   Myalgia, unspecified site    + arthralgias---she is tender EVERYWHERE (summer 2021)->rheum ref   OSA (obstructive sleep apnea) 08/2021   2022 sleep study (Dr. Dohmeier) severe, with oxygen nadir of 73%. CPAP.   Osteoporosis      Perforation of cecum    perf'd in context of SBO that followed GB surgery; no colon removed.   Ventral hernia     Past Surgical History:  Procedure Laterality Date   BREAST BIOPSY  2010   benign cyst   CHOLECYSTECTOMY     COLONOSCOPY  04/02/2014   2015 adenoma x 2->recall 5 yrs ([facility])   Iliostomy     +  takedown   MOUTH SURGERY     NOSE SURGERY     PARTIAL HYSTERECTOMY  1983   perfor colon repair     SBO after gallbladder surgery   SALPINGOOPHORECTOMY Bilateral 05/10/2014   TONSILLECTOMY     VENTRAL HERNIA REPAIR  2016   open, with mesh     Family History  Problem Relation Age of Onset   Diabetes Mother    Hypertension Mother    Hearing loss Mother    Depression Mother    Diabetes Father    Hearing loss Father    Early death Father    Stroke Maternal Grandmother    Kidney disease Maternal Grandmother    Diabetes Maternal Grandmother    Heart disease Maternal Grandmother    Arthritis Maternal Grandmother    Stroke Maternal Grandfather    Prostate cancer Maternal Grandfather    Diabetes Maternal Grandfather    Heart disease Maternal Grandfather    Arthritis Maternal Grandfather    Alcohol abuse Maternal Grandfather    Depression Maternal Grandfather    Diabetes Paternal Grandmother    Alcohol abuse Paternal Grandmother    Diabetes Paternal Grandfather    Alcohol abuse Paternal Grandfather    Heart disease Paternal Grandfather     Social History   Socioeconomic History   Marital status: Married    Spouse name: Gary   Number of children: 1   Years of education: Not on file   Highest education level: GED or equivalent  Occupational History   Not on file  Tobacco Use   Smoking status: Former    Packs/day: 1.00    Years: 25.00    Total pack years: 25.00    Types: Cigarettes    Quit date: 11/23/2010    Years since quitting: 11.4   Smokeless tobacco: Never  Substance and Sexual Activity   Alcohol use: No   Drug use: No   Sexual activity: Not on file  Other Topics Concern   Not on file  Social History Narrative   Married, 1 daughter, several grandkids (2 of whom pt is raising).   Educ: some college   Occup: retired landscaper.   Tob: 20 pack-yr hx, quit approx 2005.   Alc: hx of alcoholism, quit 2018.   No hx of drug abuse.      left handed   Caffeine: 2 12oz can a day   Social Determinants of Health   Financial Resource Strain: Low Risk  (10/26/2021)   Overall Financial Resource Strain (CARDIA)    Difficulty of Paying Living Expenses: Not very hard  Food Insecurity: No Food Insecurity  (10/26/2021)   Hunger Vital Sign    Worried About Running Out of Food in the Last Year: Never true    Ran Out of Food in the Last Year: Never true  Transportation Needs: No Transportation Needs (10/26/2021)   PRAPARE - Transportation    Lack of Transportation (Medical): No    Lack of Transportation (Non-Medical): No  Physical Activity: Insufficiently Active (10/26/2021)   Exercise Vital Sign    Days of   Exercise per Week: 3 days    Minutes of Exercise per Session: 30 min  Stress: Stress Concern Present (10/26/2021)   [employer] - Occupational Stress Questionnaire    Feeling of Stress : Very much  Social Connections: Moderately Isolated (10/26/2021)   Social Connection and Isolation Panel [NHANES]    Frequency of Communication with Friends and Family: More than three times a week    Frequency of Social Gatherings with Friends and Family: Never    Attends Religious Services: Never    Active Member of Clubs or Organizations: No    Attends Club or Organization Meetings: Not on file    Marital Status: Married  Intimate Partner Violence: Not on file    Outpatient Medications Prior to Visit  Medication Sig Dispense Refill   B-D TB SYRINGE 1CC/27GX1/2" 27G X 1/2" 1 ML MISC Inject into the skin.     cyclobenzaprine (FLEXERIL) 10 MG tablet TAKE ONE TABLET BY MOUTH 2 TIMES A DAY AS NEEDED FOR MUSCLE SPASMS. 180 tablet 1   FERROUS SULFATE PO Take 325 mg by mouth daily.     fluconazole (DIFLUCAN) 150 MG tablet Take 1 tablet (150 mg total) by mouth daily. 7 tablet 0   fluticasone (FLONASE) 50 MCG/ACT nasal spray Place 1 spray into both nostrils daily. 16 g 2   Glycopyrrolate-Formoterol (BEVESPI AEROSPHERE) 9-4.8 MCG/ACT AERO Take 2 puffs by mouth in the morning and at bedtime. 10.7 g 11   leucovorin (WELLCOVORIN) 5 MG tablet Take 5 mg by mouth daily.     meloxicam (MOBIC) 15 MG tablet Take 15 mg by mouth daily.     methotrexate 250 MG/10ML injection 0.8ml     traMADol  (ULTRAM) 50 MG tablet Take 50 mg by mouth 4 (four) times daily as needed.     albuterol (PROVENTIL) (2.5 MG/3ML) 0.083% nebulizer solution Take 3 mLs (2.5 mg total) by nebulization every 6 (six) hours as needed for wheezing or shortness of breath. (Patient not taking: Reported on 04/30/2022) 75 mL 12   albuterol (VENTOLIN HFA) 108 (90 Base) MCG/ACT inhaler Inhale 2 puffs into the lungs every 6 (six) hours as needed for wheezing or shortness of breath. (Patient not taking: Reported on 04/30/2022) 8 g 6   ALPRAZolam (XANAX) 0.5 MG tablet Take 1 tablet (0.5 mg total) by mouth 3 (three) times daily as needed. 90 tablet 5   benzonatate (TESSALON) 100 MG capsule Take 1 capsule (100 mg total) by mouth every 6 (six) hours as needed for cough. (Patient not taking: Reported on 04/30/2022) 30 capsule 1   doxycycline (VIBRA-TABS) 100 MG tablet Take 1 tablet (100 mg total) by mouth 2 (two) times daily. (Patient not taking: Reported on 04/30/2022) 14 tablet 0   FLUoxetine (PROZAC) 40 MG capsule Take 1 capsule (40 mg total) by mouth daily. 90 capsule 3   montelukast (SINGULAIR) 10 MG tablet Take 1 tablet (10 mg total) by mouth at bedtime. (Patient not taking: Reported on 04/30/2022) 30 tablet 11   pantoprazole (PROTONIX) 40 MG tablet Take 1 tablet (40 mg total) by mouth 2 (two) times daily. 180 tablet 3   pravastatin (PRAVACHOL) 10 MG tablet TAKE 1 TABLET BY MOUTH EVERY DAY 90 tablet 1   valACYclovir (VALTREX) 500 MG tablet TAKE 1 TABLET BY MOUTH THEN TAKE 1 TABLET 12 HOURS LATER 30 tablet 6   zolpidem (AMBIEN) 10 MG tablet Take 1 tablet (10 mg total) by mouth at bedtime as needed. 30 tablet 5     No facility-administered medications prior to visit.    Allergies  Allergen Reactions   Penicillins Anaphylaxis and Rash   Sulfa Antibiotics Anaphylaxis and Rash    ROS Review of Systems  Constitutional:  Positive for fatigue (chronic). Negative for appetite change, chills and fever.  HENT:  Negative for congestion, dental  problem, ear pain and sore throat.   Eyes:  Negative for discharge, redness and visual disturbance.  Respiratory:  Negative for cough, chest tightness, shortness of breath and wheezing.   Cardiovascular:  Negative for chest pain, palpitations and leg swelling.  Gastrointestinal:  Negative for abdominal pain, blood in stool, diarrhea, nausea and vomiting.  Genitourinary:  Negative for difficulty urinating, dysuria, flank pain, frequency, hematuria and urgency.  Musculoskeletal:  Positive for arthralgias (widespread, chronic), back pain (chronic LBP worse lately) and myalgias (chronic, widespread). Negative for joint swelling and neck stiffness.  Skin:  Negative for pallor and rash.  Neurological:  Negative for dizziness, speech difficulty, weakness and headaches.  Hematological:  Negative for adenopathy. Does not bruise/bleed easily.  Psychiatric/Behavioral:  Positive for sleep disturbance. Negative for confusion, dysphoric mood and suicidal ideas. The patient is nervous/anxious.     PE;    04/30/2022    8:38 AM 03/16/2022    9:09 AM 01/26/2022    9:33 AM  Vitals with BMI  Height 5' 4" 5' 4" 5' 4"  Weight 224 lbs 223 lbs 218 lbs  BMI 38.43 38.26 37.4  Systolic 105 117 112  Diastolic 68 76 64  Pulse 71 70 94    Gen: Alert, well appearing.  Patient is oriented to person, place, time, and situation. AFFECT: pleasant, lucid thought and speech. ENT: Ears: EACs clear, normal epithelium.  TMs with good light reflex and landmarks bilaterally.  Eyes: no injection, icteris, swelling, or exudate.  EOMI, PERRLA. Nose: no drainage or turbinate edema/swelling.  No injection or focal lesion.  Mouth: lips without lesion/swelling.  Oral mucosa pink and moist.  Dentition intact and without obvious caries or gingival swelling.  Oropharynx without erythema, exudate, or swelling.  Neck: supple/nontender.  No LAD, mass, or TM.  Carotid pulses 2+ bilaterally, without bruits. CV: RRR, no m/r/g.   LUNGS: CTA  bilat, nonlabored resps, good aeration in all lung fields. ABD: soft, NT, ND, BS normal.  No hepatospenomegaly or mass.  No bruits. EXT: no clubbing, cyanosis, or edema.  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  Nontender.  No erythema.    She has a similar area of swelling along the pes anserine region of the left knee.  No erythema warmth.  This area is significantly tender to touch.  No joint swelling, erythema, warmth, or tenderness.  Active range of motion of the left knee is intact but painful in pes anserine region with active and passive range of motion.  Skin - no sores or suspicious lesions or rashes or color changes  Pertinent labs:  Lab Results  Component Value Date   TSH 2.05 04/25/2021   Lab Results  Component Value Date   WBC 5.0 10/30/2021   HGB 14.0 10/30/2021   HCT 43.1 10/30/2021   MCV 95.8 10/30/2021   PLT 299.0 10/30/2021   Lab Results  Component Value Date   IRON 92 10/30/2021   TIBC 402 10/30/2021   FERRITIN 45 10/30/2021   Lab Results  Component Value Date   CREATININE 0.87 10/30/2021   BUN 12 10/30/2021   NA   137 10/30/2021   K 4.2 10/30/2021   CL 102 10/30/2021   CO2 26 10/30/2021   Lab Results  Component Value Date   ALT 35 10/30/2021   AST 33 10/30/2021   ALKPHOS 80 10/30/2021   BILITOT 0.5 10/30/2021   Lab Results  Component Value Date   CHOL 177 10/30/2021   Lab Results  Component Value Date   HDL 63.10 10/30/2021   Lab Results  Component Value Date   LDLCALC 90 10/30/2021   Lab Results  Component Value Date   TRIG 122.0 10/30/2021   Lab Results  Component Value Date   CHOLHDL 3 10/30/2021   ASSESSMENT AND PLAN:   #1 left focal knee swelling and pain.  The relative acuity and tenderness favor pes anserine bursitis as etiology. 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 Discussed this with  patient today, discussed trial of steroid injection into the pes anserine bursa and she elected to proceed with this today. Ultrasound-guided injection is preferred based on studies that show increased duration, increased effect, greater accuracy, decreased procedural pain, increased response rate, and decreased cost with ultrasound-guided versus blind injection. Procedure: Real-time ultrasound guided injection of left pes anserine bursa. Device: [employer] informed consent obtained.  Timeout conducted.  No overlying erythema, induration, or other signs of local infection. After sterile prep with Betadine, injected 2 cc of 1% lidocaine without epinephrine followed by a mixture of 40 mg Kenalog and 3 cc 1% plain lidocaine using 25-gauge 1-1/2 inch needle, injectate seen filling around pes anserine tendons. Patient tolerated the procedure well.  No immediate complications.  Post-injection care discussed. Advised to call if fever/chills, erythema, drainage, or persistent bleeding. Images permanently stored and available for review in ultrasound unit. Impression: Technically successful ultrasound-guided injection.  #2 recurrent major depressive disorder, insomnia, and GAD. She has fibromyalgia syndrome that contributes to her overall cognitive and emotional wellbeing. Continue fluoxetine 40 mg a day, Xanax 0.5 mg 3 times daily, and zolpidem 10 mg nightly.  #3 Health maintenance exam: Reviewed age and gender appropriate health maintenance issues (prudent diet, regular exercise, health risks of tobacco and excessive alcohol, use of seatbelts, fire alarms in home, use of sunscreen).  Also reviewed age and gender appropriate health screening as well as vaccine recommendations. Vaccines: All up-to-date Labs: CBC, lipid panel, c-Met Cervical ca screening: [facility]. Breast ca screening: [facility]. Colon ca screening:  Hx of adenomatous polyps, recall was 03/2019 ([facility])->she won't go back to them and no other GI will accept her.  She brought up worsening back pain at the end of the visit today and we will address this problem at her follow-up in 2 weeks.  An After Visit Summary was printed and given to the patient.  FOLLOW UP:  Return in about 2 weeks (around 05/14/2022) for f/u left knee pain/pes ans.  Signed:  Crissie Sickles, MD           04/30/2022

## 2022-05-01 ENCOUNTER — Telehealth: Payer: Self-pay | Admitting: Emergency Medicine

## 2022-05-01 ENCOUNTER — Other Ambulatory Visit: Payer: Self-pay | Admitting: Family Medicine

## 2022-05-01 NOTE — Telephone Encounter (Signed)
Called patient and she states that she can only use a certain inhaler when she has thrush. She states that she needs a letter from Dr Lamonte Sakai that states that she can only use Bevspi when she has thrush. She states that her insurance is denying to cover the inhaler.   Dr Lamonte Sakai please advise

## 2022-05-04 NOTE — Telephone Encounter (Signed)
Check with the clinical pharmacists to see if it is possible for me to do a PA for the bevespi. If so then we can order for her and I will do the PA, indicate that she has failed the alternatives.  OK to get samples for her if we have any bevespi available.

## 2022-05-05 NOTE — Telephone Encounter (Signed)
Pharmacy team, please start a PA for [place] for this patient per Dr. Lamonte Sakai.  Thank you.

## 2022-05-06 ENCOUNTER — Other Ambulatory Visit (HOSPITAL_COMMUNITY): Payer: Self-pay

## 2022-05-06 NOTE — Telephone Encounter (Signed)
PA  has been submitted KRY: [id number]

## 2022-05-08 NOTE — Telephone Encounter (Signed)
Forwarding to [facility] [place] to follow PA.

## 2022-05-11 NOTE — Telephone Encounter (Signed)
Patient checking on PA for Bevespi. Patient states has 5 days left. Pharmacy is [facility]. Patient phone number is [phone].

## 2022-05-11 NOTE — Telephone Encounter (Signed)
Routing to [employer] team for PA update

## 2022-05-12 ENCOUNTER — Other Ambulatory Visit (HOSPITAL_COMMUNITY): Payer: Self-pay

## 2022-05-12 NOTE — Telephone Encounter (Signed)
Patient Advocate Encounter  Prior Authorization for [employer] 9-4.8MCG/ACT aerosol has been approved.    PA# [id number] Effective dates: 05/07/2022 through 05/08/2023

## 2022-05-14 ENCOUNTER — Encounter: Payer: Self-pay | Admitting: Family Medicine

## 2022-05-14 ENCOUNTER — Ambulatory Visit (INDEPENDENT_AMBULATORY_CARE_PROVIDER_SITE_OTHER): Payer: Commercial Managed Care - PPO | Admitting: Family Medicine

## 2022-05-14 ENCOUNTER — Ambulatory Visit (HOSPITAL_BASED_OUTPATIENT_CLINIC_OR_DEPARTMENT_OTHER)
Admission: RE | Admit: 2022-05-14 | Discharge: 2022-05-14 | Disposition: A | Discharge status: Home / Self Care | Payer: Commercial Managed Care - PPO | Source: Ambulatory Visit | Attending: Family Medicine | Admitting: Family Medicine

## 2022-05-14 VITALS — BP 106/67 | HR 70 | Temp 97.6°F | Ht 64.0 in | Wt 220.6 lb

## 2022-05-14 DIAGNOSIS — M545 Low back pain, unspecified: Secondary | ICD-10-CM

## 2022-05-14 DIAGNOSIS — M7052 Other bursitis of knee, left knee: Secondary | ICD-10-CM

## 2022-05-14 DIAGNOSIS — M25562 Pain in left knee: Secondary | ICD-10-CM | POA: Insufficient documentation

## 2022-05-14 DIAGNOSIS — G8929 Other chronic pain: Secondary | ICD-10-CM

## 2022-05-14 DIAGNOSIS — Z79899 Other long term (current) drug therapy: Secondary | ICD-10-CM

## 2022-05-14 DIAGNOSIS — M25512 Pain in left shoulder: Secondary | ICD-10-CM

## 2022-05-14 MED ORDER — TRAMADOL HCL 50 MG PO TABS: 50.0000 mg | ORAL_TABLET | Freq: Four times a day (QID) | ORAL | 5 refills | Status: DC | PRN

## 2022-05-14 NOTE — Addendum Note (Signed)
Addended by: Deveron Furlong D on: 05/14/2022 09:40 AM   Modules accepted: Orders

## 2022-05-14 NOTE — Progress Notes (Signed)
OFFICE VISIT  05/14/2022  CC:  Chief Complaint  Patient presents with   Follow-up    Left knee pain/pes ans   Patient is a 61 y.o. female who presents for 2-week follow-up left knee pain.  Also wants to discuss low back pain. A/P as of last visit: "#1 left focal knee swelling and pain.  The relative acuity and tenderness favor pes anserine bursitis as etiology. 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 Discussed this with patient today, discussed trial of steroid injection into the pes anserine bursa and she elected to proceed with this today.  #2 recurrent major depressive disorder, insomnia, and GAD. She has fibromyalgia syndrome that contributes to her overall cognitive and emotional wellbeing. Continue fluoxetine 40 mg a day, Xanax 0.5 mg 3 times daily, and zolpidem 10 mg nightly."  INTERIM HX: Left knee feels about 50% better.  No giving way or catching.  Her pain is almost exclusively elicited by deeper bending such as going up stairs and down stairs.  Has had several months of low back pain bilaterally, centrally, and a bit more focused on the left side.  No radiation, no paresthesias.  No leg weakness.  No preceding injury.  New mattress purchased--no improvement.  She has meloxicam to take for her various musculoskeletal pains but does not take this much due to fear of liver damage. Says x-rays in the past have shown degenerative arthritis in her back. No past back surgeries or procedures.  No imaging in EMR She has seen an orthopedic provider in the past, Dr. Alfonso Ramus, but he is retiring.  He had been prescribing her tramadol 90 tabs per month long-term.  These have been helpful.  She asks that I assume responsibility for prescribing this today.   PMP AWARE reviewed today: most recent rx for tramadol was filled 04/30/22, # 13, rx by Dr. Alfonso Ramus. No red flags.  Past Medical History:  Diagnosis Date   Alcoholism ([place])    Allergic rhinitis    Anxiety and depression     Arthritis of knee    bilat, CPPD+ OA   Asthma    Cervical cancer ([place])    Choledocholithiasis    COPD (chronic obstructive pulmonary disease) ([place])    Diverticulosis 2015   Sigmoid colon (noted on colonoscopy)   Elevated LFTs    GERD (gastroesophageal reflux disease)    History of adenomatous polyp of colon 04/02/2014   Recall 5 yrs   History of pyelonephritis 12/2019   Hypercholesterolemia    Inflammatory arthritis    +mild elev ESR, Rheum eval 07/2020: rapid resp to prednisone, felt to have inflamm arth + OA and CCPD bilat knees. Methotrexate INJ q week.   Insomnia    Iron deficiency anemia 04/2021   started iron, hemoccults x 3 NEG   Myalgia, unspecified site    + arthralgias---she is tender EVERYWHERE (summer 2021)->rheum ref   OSA (obstructive sleep apnea) 08/2021   2022 sleep study (Dr. Brett Fairy) severe, with oxygen nadir of 73%. CPAP.   Osteoporosis    Perforation of cecum    perf'd in context of SBO that followed GB surgery; no colon removed.   Ventral hernia     Past Surgical History:  Procedure Laterality Date   BREAST BIOPSY  2010   benign cyst   CHOLECYSTECTOMY     COLONOSCOPY  04/02/2014   2015 adenoma x 2->recall 5 yrs ([facility])   Iliostomy     +  takedown   MOUTH  SURGERY     NOSE SURGERY     PARTIAL HYSTERECTOMY  1983   perfor colon repair     SBO after gallbladder surgery   SALPINGOOPHORECTOMY Bilateral 05/10/2014   TONSILLECTOMY     VENTRAL HERNIA REPAIR  2016   open, with mesh    Outpatient Medications Prior to Visit  Medication Sig Dispense Refill   ALPRAZolam (XANAX) 0.5 MG tablet Take 1 tablet (0.5 mg total) by mouth 3 (three) times daily as needed. 90 tablet 5   B-D TB SYRINGE 1CC/27GX1/2" 27G X 1/2" 1 ML MISC Inject into the skin.     cyclobenzaprine (FLEXERIL) 10 MG tablet TAKE ONE TABLET BY MOUTH 2 TIMES A DAY AS NEEDED FOR MUSCLE SPASMS. 180 tablet 0   FERROUS SULFATE PO Take 325 mg by mouth daily.     fluconazole (DIFLUCAN) 150  MG tablet Take 1 tablet (150 mg total) by mouth daily. 7 tablet 0   FLUoxetine (PROZAC) 40 MG capsule Take 1 capsule (40 mg total) by mouth daily. 90 capsule 1   fluticasone (FLONASE) 50 MCG/ACT nasal spray Place 1 spray into both nostrils daily. 16 g 2   Glycopyrrolate-Formoterol (BEVESPI AEROSPHERE) 9-4.8 MCG/ACT AERO Take 2 puffs by mouth in the morning and at bedtime. 10.7 g 11   leucovorin (WELLCOVORIN) 5 MG tablet Take 5 mg by mouth daily.     meloxicam (MOBIC) 15 MG tablet Take 15 mg by mouth daily.     methotrexate 250 MG/10ML injection 0.66m     pantoprazole (PROTONIX) 40 MG tablet Take 1 tablet (40 mg total) by mouth 2 (two) times daily. 180 tablet 1   pravastatin (PRAVACHOL) 10 MG tablet Take 1 tablet (10 mg total) by mouth daily. 90 tablet 1   valACYclovir (VALTREX) 500 MG tablet TAKE 1 TABLET BY MOUTH THEN TAKE 1 TABLET 12 HOURS LATER 30 tablet 6   zolpidem (AMBIEN) 10 MG tablet Take 1 tablet (10 mg total) by mouth at bedtime as needed. 30 tablet 5   traMADol (ULTRAM) 50 MG tablet Take 50 mg by mouth 4 (four) times daily as needed.     albuterol (PROVENTIL) (2.5 MG/3ML) 0.083% nebulizer solution Take 3 mLs (2.5 mg total) by nebulization every 6 (six) hours as needed for wheezing or shortness of breath. (Patient not taking: Reported on 04/30/2022) 75 mL 12   albuterol (VENTOLIN HFA) 108 (90 Base) MCG/ACT inhaler Inhale 2 puffs into the lungs every 6 (six) hours as needed for wheezing or shortness of breath. (Patient not taking: Reported on 04/30/2022) 8 g 6   No facility-administered medications prior to visit.    Allergies  Allergen Reactions   Penicillins Anaphylaxis and Rash   Sulfa Antibiotics Anaphylaxis and Rash    ROS As per HPI  PE:    05/14/2022    8:18 AM 04/30/2022    8:38 AM 03/16/2022    9:09 AM  Vitals with BMI  Height _0  _1  _2   Weight 220 lbs 10 oz 224 lbs 223 lbs  BMI 37.85 [phone] Systolic [id number] Diastolic 67 68 76  Pulse 70 71 70     Exam chaperoned by BDeveron Furlong CMA.  Physical Exam  Gen: Alert, well appearing.  Patient is oriented to person, place, time, and situation. Range of motion of L-spine fully intact but extension, rotation, and lateral bending elicit worsening pain Diffuse low back tenderness extending into the SI joints bilaterally.  Her tenderness includes central spinous  process tenderness and facet joint tenderness. Supine straight leg raise negative bilaterally. Lower extremity strength 5 out of 5 proximally and distally bilaterally. FABER and FADIR negative bilaterally.  Pelvic distraction equivocal. Left knee with mild to moderate focal swelling over pes anserine region, small ecchymoses over this.  Significant tenderness to palpation over this area.  She has some tenderness along the medial joint line as well.  No effusion.  No erythema or warmth.  LABS:  Last CBC Lab Results  Component Value Date   WBC 5.1 04/30/2022   HGB 13.8 04/30/2022   HCT 41.5 04/30/2022   MCV 99.1 04/30/2022   RDW 16.2 (H) 04/30/2022   PLT 321.0 57/49/3552   Last metabolic panel Lab Results  Component Value Date   GLUCOSE 85 04/30/2022   NA 140 04/30/2022   K 4.4 04/30/2022   CL 102 04/30/2022   CO2 29 04/30/2022   BUN 13 04/30/2022   CREATININE 0.82 04/30/2022   CALCIUM 9.9 04/30/2022   PROT 6.8 04/30/2022   ALBUMIN 4.3 04/30/2022   BILITOT 0.3 04/30/2022   ALKPHOS 84 04/30/2022   AST 18 04/30/2022   ALT 26 04/30/2022   Last lipids Lab Results  Component Value Date   CHOL 186 04/30/2022   HDL 68.70 04/30/2022   LDLCALC 90 04/30/2022   LDLDIRECT 100.0 04/25/2021   TRIG 138.0 04/30/2022   CHOLHDL 3 04/30/2022   Last thyroid functions Lab Results  Component Value Date   TSH 2.05 04/25/2021    IMPRESSION AND PLAN:  #1 left knee pain, suspect pes anserine bursitis as well as a component of osteoarthritis. Pes anserine injection 2 weeks ago did lead to significant improvement but I do  feel like she would benefit from PT so this was ordered today.  Additionally, ordered plain films of the left knee.  #2 acute/subacute low back pain.  Suspect musculoskeletal strain and SI joint pain superimposed on osteoarthritis. X-rays ordered today. Recommended meloxicam 15 mg a day for 7 days.  Recommended physical therapy--ordered today  #3 chronic left shoulder pain. History of osteoarthritis in this joint as well as acute strain. She had been prescribed tramadol by previous orthopedic provider, who is now retiring. Patient has always taken medications as appropriate.  I will assume responsibility for prescribing tramadol 50 mg, 1 3 times daily as needed, #90, refill x5. UDS is up-to-date.  An After Visit Summary was printed and given to the patient.  FOLLOW UP: Return in about 2 months (around 07/14/2022) for f/u knee and back pain.  Signed:  Crissie Sickles, MD           05/14/2022

## 2022-05-16 ENCOUNTER — Other Ambulatory Visit: Payer: Self-pay | Admitting: Internal Medicine

## 2022-05-27 ENCOUNTER — Other Ambulatory Visit: Payer: Self-pay | Admitting: Family Medicine

## 2022-06-15 ENCOUNTER — Encounter: Payer: Self-pay | Admitting: Family Medicine

## 2022-07-10 ENCOUNTER — Ambulatory Visit (INDEPENDENT_AMBULATORY_CARE_PROVIDER_SITE_OTHER): Payer: Commercial Managed Care - PPO | Admitting: Family Medicine

## 2022-07-10 ENCOUNTER — Encounter: Payer: Self-pay | Admitting: Family Medicine

## 2022-07-10 VITALS — BP 121/73 | HR 92 | Temp 97.4°F | Ht 64.0 in

## 2022-07-10 DIAGNOSIS — R300 Dysuria: Secondary | ICD-10-CM

## 2022-07-10 DIAGNOSIS — N3001 Acute cystitis with hematuria: Secondary | ICD-10-CM

## 2022-07-10 LAB — POCT URINALYSIS DIPSTICK
Bilirubin, UA: NEGATIVE
Blood, UA: POSITIVE
Glucose, UA: NEGATIVE
Ketones, UA: NEGATIVE
Nitrite, UA: NEGATIVE
Odor: NEGATIVE
Protein, UA: NEGATIVE
Spec Grav, UA: <=1.005 — AB (ref 1.010–1.025)
Urobilinogen, UA: 0.2 E.U./dL
pH, UA: 6 (ref 5.0–8.0)

## 2022-07-10 MED ORDER — CIPROFLOXACIN HCL 500 MG PO TABS: 500.0000 mg | ORAL_TABLET | Freq: Two times a day (BID) | ORAL | 0 refills | Status: AC

## 2022-07-10 NOTE — Patient Instructions (Signed)
Urinary Tract Infection, Adult A urinary tract infection (UTI) is an infection of any part of the urinary tract. The urinary tract includes: The kidneys. The ureters. The bladder. The urethra. These organs make, store, and get rid of pee (urine) in the body. What are the causes? This infection is caused by germs (bacteria) in your genital area. These germs grow and cause swelling (inflammation) of your urinary tract. What increases the risk? The following factors may make you more likely to develop this condition: Using a small, thin tube (catheter) to drain pee. Not being able to control when you pee or poop (incontinence). Being female. If you are female, these things can increase the risk: Using these methods to prevent pregnancy: A medicine that kills sperm (spermicide). A device that blocks sperm (diaphragm). Having low levels of a female hormone (estrogen). Being pregnant. You are more likely to develop this condition if: You have genes that add to your risk. You are sexually active. You take antibiotic medicines. You have trouble peeing because of: A prostate that is bigger than normal, if you are female. A blockage in the part of your body that drains pee from the bladder. A kidney stone. A nerve condition that affects your bladder. Not getting enough to drink. Not peeing often enough. You have other conditions, such as: Diabetes. A weak disease-fighting system (immune system). Sickle cell disease. Gout. Injury of the spine. What are the signs or symptoms? Symptoms of this condition include: Needing to pee right away. Peeing small amounts often. Pain or burning when peeing. Blood in the pee. Pee that smells bad or not like normal. Trouble peeing. Pee that is cloudy. Fluid coming from the vagina, if you are female. Pain in the belly or lower back. Other symptoms include: Vomiting. Not feeling hungry. Feeling mixed up (confused). This may be the first symptom in  older adults. Being tired and grouchy (irritable). A fever. Watery poop (diarrhea). How is this treated? Taking antibiotic medicine. Taking other medicines. Drinking enough water. In some cases, you may need to see a specialist. Follow these instructions at home:  Medicines Take over-the-counter and prescription medicines only as told by your doctor. If you were prescribed an antibiotic medicine, take it as told by your doctor. Do not stop taking it even if you start to feel better. General instructions Make sure you: Pee until your bladder is empty. Do not hold pee for a long time. Empty your bladder after sex. Wipe from front to back after peeing or pooping if you are a female. Use each tissue one time when you wipe. Drink enough fluid to keep your pee pale yellow. Keep all follow-up visits. Contact a doctor if: You do not get better after 1-2 days. Your symptoms go away and then come back. Get help right away if: You have very bad back pain. You have very bad pain in your lower belly. You have a fever. You have chills. You feeling like you will vomit or you vomit. Summary A urinary tract infection (UTI) is an infection of any part of the urinary tract. This condition is caused by germs in your genital area. There are many risk factors for a UTI. Treatment includes antibiotic medicines. Drink enough fluid to keep your pee pale yellow. This information is not intended to replace advice given to you by your health care provider. Make sure you discuss any questions you have with your health care provider. Document Revised: 06/21/2020 Document Reviewed: 06/21/2020 Elsevier Patient Education    2023 Elsevier Inc.  

## 2022-07-10 NOTE — Progress Notes (Signed)
Olivia Mckee , 1961/05/11, 61 y.o., female MRN: [id number] Patient Care Team    Relationship Specialty Notifications Start End  McGowen, Adrian Blackwater, MD PCP - General Family Medicine  03/07/20   Collene Gobble, MD Consulting Physician Pulmonary Disease  03/07/20   Breck Coons, MD Consulting Physician Gastroenterology  03/29/20   Leafy Kindle, PA-C Consulting Physician Rheumatology  08/14/20   Dohmeier, Asencion Partridge, MD Consulting Physician Neurology  09/09/21   Lady Gary, [facility]    11/13/21     Chief Complaint  Patient presents with   Dysuria    Pt c/o dysuria, urine freq, low abd pain x 3 days;      Subjective: Pt presents for an OV with complaints of dysuria and urinary frequency.  She states symptoms onset was approximately 3 days ago.  She has been hydrating well.  She did take Azo OTC for 2 days and stopped about 24 hours ago.   Hospitalization 2021: History of pyelonephritis secondary to E. coli/pansensitive.  Patient is allergic to penicillins and sulfa.  GFR greater than 60.     10/30/2021    8:27 AM 06/03/2021   11:32 AM  Depression screen PHQ 2/9  Decreased Interest 1 2  Down, Depressed, Hopeless 1 1  PHQ - 2 Score 2 3  Altered sleeping 1 3  Tired, decreased energy 1 3  Change in appetite 0 2  Feeling bad or failure about yourself  1 3  Trouble concentrating 1 0  Moving slowly or fidgety/restless 1 0  Suicidal thoughts 0 0  PHQ-9 Score 7 14  Difficult doing work/chores  Somewhat difficult    Allergies  Allergen Reactions   Penicillins Anaphylaxis and Rash   Sulfa Antibiotics Anaphylaxis and Rash   Social History   Social History Narrative   Married, 1 daughter, several grandkids (2 of whom pt is raising).   Educ: some college   Occup: retired Development worker, [employer].   Tob: 20 pack-yr hx, quit approx 2005.   Alc: hx of alcoholism, quit 2018.   No hx of drug abuse.      left handed   Caffeine: 2 12oz can a day   Past Medical History:   Diagnosis Date   Alcoholism ([place])    Allergic rhinitis    Anxiety and depression    Arthritis of knee    bilat, CPPD+ OA   Asthma    Cervical cancer ([facility])    Choledocholithiasis    COPD (chronic obstructive pulmonary disease) ([place])    Diverticulosis 2015   Sigmoid colon (noted on colonoscopy)   Elevated LFTs    GERD (gastroesophageal reflux disease)    History of adenomatous polyp of colon 04/02/2014   Recall 5 yrs   History of pyelonephritis 12/2019   Hypercholesterolemia    Inflammatory arthritis    +mild elev ESR, Rheum eval 07/2020: rapid resp to prednisone, felt to have inflamm arth + OA and CCPD bilat knees. Methotrexate INJ q week.   Insomnia    Iron deficiency anemia 04/2021   started iron, hemoccults x 3 NEG   Lumbar spondylosis    Malignant tumor of cervix ([place]) 07/08/2021   Hx of @ age 27. HYST Removal Reason: Cx Cancer   Myalgia, unspecified site    + arthralgias---she is tender EVERYWHERE (summer 2021)->rheum ref   OSA (obstructive sleep apnea) 08/2021   2022 sleep study (Dr. Brett Fairy) severe, with oxygen nadir of 73%. CPAP.   Osteoarthritis, knee    Osteoporosis  Perforation of cecum    perf'd in context of SBO that followed GB surgery; no colon removed.   Ventral hernia    Past Surgical History:  Procedure Laterality Date   BREAST BIOPSY  2010   benign cyst   CHOLECYSTECTOMY     COLONOSCOPY  04/02/2014   2015 adenoma x 2->recall 5 yrs ([facility])   Iliostomy     +  takedown   MOUTH SURGERY     NOSE SURGERY     PARTIAL HYSTERECTOMY  1983   perfor colon repair     SBO after gallbladder surgery   POLYSOMNOGRAPHY  08/2021   Sleep study showed severe OSA, CPAP recommended.   SALPINGOOPHORECTOMY Bilateral 05/10/2014   TONSILLECTOMY     VENTRAL HERNIA REPAIR  2016   open, with mesh   Family History  Problem Relation Age of Onset   Diabetes Mother    Hypertension Mother    Hearing loss Mother    Depression Mother    Diabetes Father     Hearing loss Father    Early death Father    Stroke Maternal Grandmother    Kidney disease Maternal Grandmother    Diabetes Maternal Grandmother    Heart disease Maternal Grandmother    Arthritis Maternal Grandmother    Stroke Maternal Grandfather    Prostate cancer Maternal Grandfather    Diabetes Maternal Grandfather    Heart disease Maternal Grandfather    Arthritis Maternal Grandfather    Alcohol abuse Maternal Grandfather    Depression Maternal Grandfather    Diabetes Paternal Grandmother    Alcohol abuse Paternal Grandmother    Diabetes Paternal Grandfather    Alcohol abuse Paternal Grandfather    Heart disease Paternal Grandfather    Allergies as of 07/10/2022       Reactions   Penicillins Anaphylaxis, Rash   Sulfa Antibiotics Anaphylaxis, Rash        Medication List        Accurate as of July 10, 2022  4:23 PM. If you have any questions, ask your nurse or doctor.          albuterol 108 (90 Base) MCG/ACT inhaler Commonly known as: VENTOLIN HFA Inhale 2 puffs into the lungs every 6 (six) hours as needed for wheezing or shortness of breath.   albuterol (2.5 MG/3ML) 0.083% nebulizer solution Commonly known as: PROVENTIL Take 3 mLs (2.5 mg total) by nebulization every 6 (six) hours as needed for wheezing or shortness of breath.   ALPRAZolam 0.5 MG tablet Commonly known as: XANAX Take 1 tablet (0.5 mg total) by mouth 3 (three) times daily as needed.   B-D TB SYRINGE 1CC/27GX1/2" 27G X 1/2" 1 ML Misc Generic drug: TUBERCULIN SYR 1CC/27GX1/2" Inject into the skin.   Bevespi Aerosphere 9-4.8 MCG/ACT Aero Generic drug: Glycopyrrolate-Formoterol Take 2 puffs by mouth in the morning and at bedtime.   cyclobenzaprine 10 MG tablet Commonly known as: FLEXERIL TAKE ONE TABLET BY MOUTH 2 TIMES A DAY AS NEEDED FOR MUSCLE SPASMS.   FERROUS SULFATE PO Take 325 mg by mouth daily.   fluconazole 150 MG tablet Commonly known as: Diflucan Take 1 tablet (150 mg  total) by mouth daily.   FLUoxetine 40 MG capsule Commonly known as: PROZAC Take 1 capsule (40 mg total) by mouth daily.   fluticasone 50 MCG/ACT nasal spray Commonly known as: FLONASE Place 1 spray into both nostrils daily.   leucovorin 5 MG tablet Commonly known as: WELLCOVORIN Take 5 mg by  mouth daily.   meloxicam 15 MG tablet Commonly known as: MOBIC Take 15 mg by mouth daily.   methotrexate 250 MG/10ML injection 0.60m   pantoprazole 40 MG tablet Commonly known as: PROTONIX Take 1 tablet (40 mg total) by mouth 2 (two) times daily.   pravastatin 10 MG tablet Commonly known as: PRAVACHOL Take 1 tablet (10 mg total) by mouth daily.   traMADol 50 MG tablet Commonly known as: ULTRAM Take 1 tablet (50 mg total) by mouth 4 (four) times daily as needed.   valACYclovir 500 MG tablet Commonly known as: VALTREX TAKE 1 TABLET BY MOUTH THEN TAKE 1 TABLET 12 HOURS LATER   zolpidem 10 MG tablet Commonly known as: AMBIEN Take 1 tablet (10 mg total) by mouth at bedtime as needed.        All past medical history, surgical history, allergies, family history, immunizations andmedications were updated in the EMR today and reviewed under the history and medication portions of their EMR.     Review of Systems  Constitutional:  Negative for chills, fever and malaise/fatigue.  Gastrointestinal:  Negative for constipation, diarrhea, nausea and vomiting.  Genitourinary:  Positive for dysuria, frequency, hematuria and urgency. Negative for flank pain.  Neurological:  Negative for dizziness.   Negative, with the exception of above mentioned in HPI   Objective:  BP 121/73   Pulse 92   Temp (!) 97.4 F (36.3 C) (Oral)   Ht 5' 4"  (1.626 m)   SpO2 96%   BMI 37.87 kg/m  Body mass index is 37.87 kg/m. Physical Exam Vitals and nursing note reviewed.  Constitutional:      General: She is not in acute distress.    Appearance: Normal appearance. She is normal weight. She is not  ill-appearing or toxic-appearing.  HENT:     Head: Normocephalic and atraumatic.  Eyes:     Extraocular Movements: Extraocular movements intact.     Conjunctiva/sclera: Conjunctivae normal.     Pupils: Pupils are equal, round, and reactive to light.  Cardiovascular:     Rate and Rhythm: Normal rate and regular rhythm.  Abdominal:     General: There is no distension.     Tenderness: There is abdominal tenderness (Suprapubic tenderness on exam). There is no right CVA tenderness, left CVA tenderness, guarding or rebound.  Neurological:     Mental Status: She is alert and oriented to person, place, and time. Mental status is at baseline.  Psychiatric:        Mood and Affect: Mood normal.        Behavior: Behavior normal.        Thought Content: Thought content normal.        Judgment: Judgment normal.      No results found. No results found. Results for orders placed or performed in visit on 07/10/22 (from the past 24 hour(s))  POCT Urinalysis Dipstick     Status: Abnormal   Collection Time: 07/10/22  4:17 PM  Result Value Ref Range   Color, UA yellow    Clarity, UA cloudy    Glucose, UA Negative Negative   Bilirubin, UA neg    Ketones, UA neg    Spec Grav, UA <=1.005 (A) 1.010 - 1.025   Blood, UA pos    pH, UA 6.0 5.0 - 8.0   Protein, UA Negative Negative   Urobilinogen, UA 0.2 0.2 or 1.0 E.U./dL   Nitrite, UA neg    Leukocytes, UA Large (3+) (A) Negative  Appearance     Odor neg     Assessment/Plan: Olivia Mckee is a 61 y.o. female present for OV for  Dysuria/acute cystitis with hematuria - POCT Urinalysis Dipstick-appears infectious today with large leukocytes and blood - Urinalysis w microscopic + reflex cultur Continue rest and hydration. Elected to start Cipro twice daily x5 days with her history of pyelonephritis. Patient will be called with culture results once available. If symptoms worsen over the weekend, would encourage her to seek emergent  treatment  Reviewed expectations re: course of current medical issues. Discussed self-management of symptoms. Outlined signs and symptoms indicating need for more acute intervention. Patient verbalized understanding and all questions were answered. Patient received an After-Visit Summary.    Orders Placed This Encounter  Procedures   Urinalysis w microscopic + reflex cultur   POCT Urinalysis Dipstick   No orders of the defined types were placed in this encounter.  Referral Orders  No referral(s) requested today     Note is dictated utilizing voice recognition software. Although note has been proof read prior to signing, occasional typographical errors still can be missed. If any questions arise, please do not hesitate to call for verification.   electronically signed by:  Howard Pouch, DO  [place]

## 2022-07-14 ENCOUNTER — Ambulatory Visit (INDEPENDENT_AMBULATORY_CARE_PROVIDER_SITE_OTHER): Payer: Commercial Managed Care - PPO | Admitting: Family Medicine

## 2022-07-14 ENCOUNTER — Telehealth: Payer: Self-pay | Admitting: Family Medicine

## 2022-07-14 ENCOUNTER — Encounter: Payer: Self-pay | Admitting: Family Medicine

## 2022-07-14 VITALS — BP 111/68 | HR 84 | Temp 97.8°F | Ht 64.0 in | Wt 213.0 lb

## 2022-07-14 DIAGNOSIS — B3731 Acute candidiasis of vulva and vagina: Secondary | ICD-10-CM

## 2022-07-14 DIAGNOSIS — N3001 Acute cystitis with hematuria: Secondary | ICD-10-CM | POA: Diagnosis not present

## 2022-07-14 DIAGNOSIS — M7050 Other bursitis of knee, unspecified knee: Secondary | ICD-10-CM | POA: Diagnosis not present

## 2022-07-14 DIAGNOSIS — Z23 Encounter for immunization: Secondary | ICD-10-CM | POA: Diagnosis not present

## 2022-07-14 DIAGNOSIS — M25562 Pain in left knee: Secondary | ICD-10-CM | POA: Diagnosis not present

## 2022-07-14 LAB — URINALYSIS W MICROSCOPIC + REFLEX CULTURE
Bilirubin Urine: NEGATIVE
Glucose, UA: NEGATIVE
Hyaline Cast: NONE SEEN /LPF
Ketones, ur: NEGATIVE
Nitrites, Initial: NEGATIVE
Protein, ur: NEGATIVE
Specific Gravity, Urine: 1.004 (ref 1.001–1.035)
Squamous Epithelial / HPF: NONE SEEN /HPF (ref <=5)
pH: 6 (ref 5.0–8.0)

## 2022-07-14 LAB — CULTURE INDICATED

## 2022-07-14 LAB — URINE CULTURE
MICRO NUMBER:: 13802218
SPECIMEN QUALITY:: ADEQUATE

## 2022-07-14 MED ORDER — FLUCONAZOLE 150 MG PO TABS: ORAL_TABLET | ORAL | 0 refills | Status: DC

## 2022-07-14 MED ORDER — TRIAMCINOLONE ACETONIDE 40 MG/ML IJ SUSP
20.0000 mg | Freq: Once | INTRAMUSCULAR | Status: AC
  Administered 2022-07-14: 20 mg via INTRA_ARTICULAR

## 2022-07-14 NOTE — Telephone Encounter (Signed)
Please inform patient her urine culture resulted with E. coli. The medication we prescribed should effectively cure infection.    FYI: I see she saw Dr. Anitra Lauth today for a different issue.  He may have told her already

## 2022-07-14 NOTE — Progress Notes (Signed)
OFFICE VISIT  07/14/2022  CC:  Chief Complaint  Patient presents with   Follow-up    Patient has not went to PT because of cost, it was recommended she go 3 days a week. She has been walking around in the pool instead at least 4-5 days a week.    Patient is a 61 y.o. female who presents for follow-up knee and back pain as well as follow-up recent UTI. I last saw her 05/14/2022. A/P as of that visit:  "#1 left knee pain, suspect pes anserine bursitis as well as a component of osteoarthritis. Pes anserine injection 2 weeks ago did lead to significant improvement but I do feel like she would benefit from PT so this was ordered today.  Additionally, ordered plain films of the left knee.   #2 acute/subacute low back pain.  Suspect musculoskeletal strain and SI joint pain superimposed on osteoarthritis. X-rays ordered today. Recommended meloxicam 15 mg a day for 7 days.  Recommended physical therapy--ordered today   #3 chronic left shoulder pain. History of osteoarthritis in this joint as well as acute strain. She had been prescribed tramadol by previous orthopedic provider, who is now retiring. Patient has always taken medications as appropriate.  I will assume responsibility for prescribing tramadol 50 mg, 1 3 times daily as needed, #90, refill x5. UDS is up-to-date."  INTERIM HX: Patient was seen 07/10/2022 by another provider for UTI and was prescribed Cipro x 5d.  Culture showed E. coli, susceptibility testing is pending. She says her UTI symptoms are improving appropriately but she feels like she does have a yeast infection since being on the antibiotic.  Last visit an x-ray showed multilevel degenerative changes lumbar spine. Her back pain is pretty stable, unchanged.  Her left knee pain improved about 50% after the injection but she was unable to afford the physical therapy.  After about 2 to 3 weeks the pain returned fully.  Hurts more when up and walking on it.  She continues to  note swelling over the pes anserine location.  Also feels significant pain at times in medial joint line.  Is like her joint will give way.  No catching.  No knee joint swelling noted. She takes daily NSAID.   PMP AWARE reviewed today: most recent rx for tramadol was filled 06/29/2022, #90, rx by me. No red flags.  Past Medical History:  Diagnosis Date   Alcoholism ([place])    Allergic rhinitis    Anxiety and depression    Arthritis of knee    bilat, CPPD+ OA   Asthma    Cervical cancer ([place])    Choledocholithiasis    COPD (chronic obstructive pulmonary disease) ([place])    Diverticulosis 2015   Sigmoid colon (noted on colonoscopy)   Elevated LFTs    GERD (gastroesophageal reflux disease)    History of adenomatous polyp of colon 04/02/2014   Recall 5 yrs   History of pyelonephritis 12/2019   Hypercholesterolemia    Inflammatory arthritis    +mild elev ESR, Rheum eval 07/2020: rapid resp to prednisone, felt to have inflamm arth + OA and CCPD bilat knees. Methotrexate INJ q week.   Insomnia    Iron deficiency anemia 04/2021   started iron, hemoccults x 3 NEG   Lumbar spondylosis    Malignant tumor of cervix ([place]) 07/08/2021   Hx of @ age 57. HYST Removal Reason: Cx Cancer   Myalgia, unspecified site    + arthralgias---she is tender EVERYWHERE (summer 2021)->rheum ref  OSA (obstructive sleep apnea) 08/2021   2022 sleep study (Dr. Brett Fairy) severe, with oxygen nadir of 73%. CPAP.   Osteoarthritis, knee    Osteoporosis    Perforation of cecum    perf'd in context of SBO that followed GB surgery; no colon removed.   Ventral hernia     Past Surgical History:  Procedure Laterality Date   BREAST BIOPSY  2010   benign cyst   CHOLECYSTECTOMY     COLONOSCOPY  04/02/2014   2015 adenoma x 2->recall 5 yrs ([facility])   Iliostomy     +  takedown   MOUTH SURGERY     NOSE SURGERY     PARTIAL HYSTERECTOMY  1983   perfor colon repair     SBO after gallbladder surgery    POLYSOMNOGRAPHY  08/2021   Sleep study showed severe OSA, CPAP recommended.   SALPINGOOPHORECTOMY Bilateral 05/10/2014   TONSILLECTOMY     VENTRAL HERNIA REPAIR  2016   open, with mesh    Outpatient Medications Prior to Visit  Medication Sig Dispense Refill   ALPRAZolam (XANAX) 0.5 MG tablet Take 1 tablet (0.5 mg total) by mouth 3 (three) times daily as needed. 90 tablet 5   B-D TB SYRINGE 1CC/27GX1/2" 27G X 1/2" 1 ML MISC Inject into the skin.     ciprofloxacin (CIPRO) 500 MG tablet Take 1 tablet (500 mg total) by mouth 2 (two) times daily for 5 days. 10 tablet 0   cyclobenzaprine (FLEXERIL) 10 MG tablet TAKE ONE TABLET BY MOUTH 2 TIMES A DAY AS NEEDED FOR MUSCLE SPASMS. 180 tablet 0   FERROUS SULFATE PO Take 325 mg by mouth daily.     FLUoxetine (PROZAC) 40 MG capsule Take 1 capsule (40 mg total) by mouth daily. 90 capsule 1   fluticasone (FLONASE) 50 MCG/ACT nasal spray Place 1 spray into both nostrils daily. 16 g 2   Glycopyrrolate-Formoterol (BEVESPI AEROSPHERE) 9-4.8 MCG/ACT AERO Take 2 puffs by mouth in the morning and at bedtime. 10.7 g 11   leucovorin (WELLCOVORIN) 5 MG tablet Take 5 mg by mouth daily.     meloxicam (MOBIC) 15 MG tablet Take 15 mg by mouth daily.     methotrexate 250 MG/10ML injection 0.76m     pantoprazole (PROTONIX) 40 MG tablet Take 1 tablet (40 mg total) by mouth 2 (two) times daily. 180 tablet 1   pravastatin (PRAVACHOL) 10 MG tablet Take 1 tablet (10 mg total) by mouth daily. 90 tablet 1   traMADol (ULTRAM) 50 MG tablet Take 1 tablet (50 mg total) by mouth 4 (four) times daily as needed. 90 tablet 5   valACYclovir (VALTREX) 500 MG tablet TAKE 1 TABLET BY MOUTH THEN TAKE 1 TABLET 12 HOURS LATER 180 tablet 0   zolpidem (AMBIEN) 10 MG tablet Take 1 tablet (10 mg total) by mouth at bedtime as needed. 30 tablet 5   fluconazole (DIFLUCAN) 150 MG tablet Take 1 tablet (150 mg total) by mouth daily. 7 tablet 0   albuterol (PROVENTIL) (2.5 MG/3ML) 0.083% nebulizer  solution Take 3 mLs (2.5 mg total) by nebulization every 6 (six) hours as needed for wheezing or shortness of breath. (Patient not taking: Reported on 04/30/2022) 75 mL 12   albuterol (VENTOLIN HFA) 108 (90 Base) MCG/ACT inhaler Inhale 2 puffs into the lungs every 6 (six) hours as needed for wheezing or shortness of breath. (Patient not taking: Reported on 04/30/2022) 8 g 6   No facility-administered medications prior to visit.  Allergies  Allergen Reactions   Penicillins Anaphylaxis and Rash   Sulfa Antibiotics Anaphylaxis and Rash    ROS As per HPI  PE:    07/14/2022    8:13 AM 07/10/2022    3:55 PM 05/14/2022    8:18 AM  Vitals with BMI  Height 5' 4"  5' 4"  5' 4"   Weight 213 lbs  220 lbs 10 oz  BMI 40.08  67.61  Systolic 950 932 671  Diastolic 68 73 67  Pulse 84 92 70   Physical Exam  General: Alert and well-appearing. Left knee range of motion is fully intact.  No effusion.  No erythema or warmth. She does have medial joint line tenderness.  Positive McMurray's. She has focal swelling and significant tenderness over the pes anserine region.  Right knee exam normal.   LABS:  Last CBC Lab Results  Component Value Date   WBC 5.1 04/30/2022   HGB 13.8 04/30/2022   HCT 41.5 04/30/2022   MCV 99.1 04/30/2022   RDW 16.2 (H) 04/30/2022   PLT 321.0 24/58/0998   Last metabolic panel Lab Results  Component Value Date   GLUCOSE 85 04/30/2022   NA 140 04/30/2022   K 4.4 04/30/2022   CL 102 04/30/2022   CO2 29 04/30/2022   BUN 13 04/30/2022   CREATININE 0.82 04/30/2022   CALCIUM 9.9 04/30/2022   PROT 6.8 04/30/2022   ALBUMIN 4.3 04/30/2022   BILITOT 0.3 04/30/2022   ALKPHOS 84 04/30/2022   AST 18 04/30/2022   ALT 26 04/30/2022   IMPRESSION AND PLAN:  #1 subacute left knee pain, exam consistent with pes anserine bursitis as well as possible medial meniscus injury. She was unable to do PT due to cost. She has not been able to rest the knee much. Injection did  help improve pain about 50% for approximately 2 to 3 weeks. Bedside ultrasound today showed no effusion.  She does have some degenerative changes, including medial meniscus degenerative changes.  No hyperemia.  Minimal anechoic distention of pes anserine bursa region.  I cannot discern any focal soft tissue mass. X-ray of knee recently did confirm some mild osteoarthritis. Discussed options today and decided for a repeat of the pes anserine bursa steroid injection. If she gets no lasting improvement from this and she has ongoing signs of medial meniscus injury then I feel like MRI knee is the next step (+/- articular knee injection). Hinged knee brace prescribed today.  Ultrasound-guided injection is preferred based on studies that show increased duration, increased effect, greater accuracy, decreased procedural pain, increased response rate, and decreased cost with ultrasound-guided versus blind injection. Procedure: Real-time ultrasound guided injection of left pes anserine bursa. Device: [employer] informed consent obtained.  Timeout conducted.  No overlying erythema, induration, or other signs of local infection. After sterile prep with Betadine, injected mixture of 1 mL of 1% plain lidocaine and 20 mg of triamcinolone using 25-gauge 1-1/2 inch needle, into the pes anserine bursa.  Injectate seen filling the appropriate region. Patient tolerated the procedure well.  No immediate complications.  Post-injection care discussed. Advised to call if fever/chills, erythema, drainage, or persistent bleeding.  Impression: Technically successful ultrasound-guided injection.  #2 UTI, symptoms resolving on Cipro. Antibiotic sensitivities are pending at this time. She will finish Cipro.  3 yeast vaginitis. Diflucan 150 mg prescribed.  An After Visit Summary was printed and given to the patient.  FOLLOW UP: Return in about 4 weeks (around 08/11/2022) for f/u knee pain.  Signed:  Crissie Sickles,  MD           07/14/2022

## 2022-07-14 NOTE — Telephone Encounter (Signed)
Spoke with patient regarding results/recommendations.  

## 2022-07-26 ENCOUNTER — Other Ambulatory Visit: Payer: Self-pay | Admitting: Family Medicine

## 2022-08-13 ENCOUNTER — Encounter: Payer: Self-pay | Admitting: Family Medicine

## 2022-08-13 ENCOUNTER — Ambulatory Visit (INDEPENDENT_AMBULATORY_CARE_PROVIDER_SITE_OTHER): Payer: Commercial Managed Care - PPO | Admitting: Family Medicine

## 2022-08-13 VITALS — BP 110/67 | HR 65 | Temp 97.7°F | Ht 64.0 in | Wt 213.8 lb

## 2022-08-13 DIAGNOSIS — M1712 Unilateral primary osteoarthritis, left knee: Secondary | ICD-10-CM

## 2022-08-13 DIAGNOSIS — R252 Cramp and spasm: Secondary | ICD-10-CM | POA: Diagnosis not present

## 2022-08-13 DIAGNOSIS — G8929 Other chronic pain: Secondary | ICD-10-CM

## 2022-08-13 DIAGNOSIS — M25562 Pain in left knee: Secondary | ICD-10-CM

## 2022-08-13 NOTE — Progress Notes (Signed)
OFFICE VISIT  08/13/2022  CC:  Chief Complaint  Patient presents with   Follow-up    Left knee pain    Patient is a 61 y.o. female who presents for 1 month follow-up left knee pain Last visit I did another pes anserine steroid injection.  INTERIM HX: Feeling better.  Able to wear the brace for quite a while each day.  Feels like she is more stable and has less pain. Complains of muscle cramps in her toes.  Past Medical History:  Diagnosis Date   Alcoholism ([place])    Allergic rhinitis    Anxiety and depression    Arthritis of knee    bilat, CPPD+ OA   Asthma    Cervical cancer ([place])    Choledocholithiasis    COPD (chronic obstructive pulmonary disease) ([place])    Diverticulosis 2015   Sigmoid colon (noted on colonoscopy)   Elevated LFTs    GERD (gastroesophageal reflux disease)    History of adenomatous polyp of colon 04/02/2014   Recall 5 yrs   History of pyelonephritis 12/2019   Hypercholesterolemia    Inflammatory arthritis    +mild elev ESR, Rheum eval 07/2020: rapid resp to prednisone, felt to have inflamm arth + OA and CCPD bilat knees. Methotrexate INJ q week.   Insomnia    Iron deficiency anemia 04/2021   started iron, hemoccults x 3 NEG   Lumbar spondylosis    Malignant tumor of cervix ([place]) 07/08/2021   Hx of @ age 72. HYST Removal Reason: Cx Cancer   Myalgia, unspecified site    + arthralgias---she is tender EVERYWHERE (summer 2021)->rheum ref   OSA (obstructive sleep apnea) 08/2021   2022 sleep study (Dr. Brett Fairy) severe, with oxygen nadir of 73%. CPAP.   Osteoarthritis, knee    Osteoporosis    Perforation of cecum    perf'd in context of SBO that followed GB surgery; no colon removed.   Ventral hernia     Past Surgical History:  Procedure Laterality Date   BREAST BIOPSY  2010   benign cyst   CHOLECYSTECTOMY     COLONOSCOPY  04/02/2014   2015 adenoma x 2->recall 5 yrs ([facility])   Iliostomy     +  takedown   MOUTH SURGERY     NOSE SURGERY      PARTIAL HYSTERECTOMY  1983   perfor colon repair     SBO after gallbladder surgery   POLYSOMNOGRAPHY  08/2021   Sleep study showed severe OSA, CPAP recommended.   SALPINGOOPHORECTOMY Bilateral 05/10/2014   TONSILLECTOMY     VENTRAL HERNIA REPAIR  2016   open, with mesh    Outpatient Medications Prior to Visit  Medication Sig Dispense Refill   ALPRAZolam (XANAX) 0.5 MG tablet Take 1 tablet (0.5 mg total) by mouth 3 (three) times daily as needed. 90 tablet 5   B-D TB SYRINGE 1CC/27GX1/2" 27G X 1/2" 1 ML MISC Inject into the skin.     cyclobenzaprine (FLEXERIL) 10 MG tablet TAKE ONE TABLET BY MOUTH 2 TIMES A DAY AS NEEDED FOR MUSCLE SPASMS. 60 tablet 1   FERROUS SULFATE PO Take 325 mg by mouth daily.     fluconazole (DIFLUCAN) 150 MG tablet 1 tab po x 1 dose, then repeat in 1 week 2 tablet 0   fluticasone (FLONASE) 50 MCG/ACT nasal spray Place 1 spray into both nostrils daily. 16 g 2   Glycopyrrolate-Formoterol (BEVESPI AEROSPHERE) 9-4.8 MCG/ACT AERO Take 2 puffs by mouth in the morning  and at bedtime. 10.7 g 11   leucovorin (WELLCOVORIN) 5 MG tablet Take 5 mg by mouth daily.     meloxicam (MOBIC) 15 MG tablet Take 15 mg by mouth daily.     methotrexate 250 MG/10ML injection 0.60m     montelukast (SINGULAIR) 10 MG tablet Take 10 mg by mouth at bedtime.     pantoprazole (PROTONIX) 40 MG tablet Take 1 tablet (40 mg total) by mouth 2 (two) times daily. 180 tablet 1   pravastatin (PRAVACHOL) 10 MG tablet Take 1 tablet (10 mg total) by mouth daily. 90 tablet 1   traMADol (ULTRAM) 50 MG tablet Take 1 tablet (50 mg total) by mouth 4 (four) times daily as needed. 90 tablet 5   zolpidem (AMBIEN) 10 MG tablet Take 1 tablet (10 mg total) by mouth at bedtime as needed. 30 tablet 5   albuterol (PROVENTIL) (2.5 MG/3ML) 0.083% nebulizer solution Take 3 mLs (2.5 mg total) by nebulization every 6 (six) hours as needed for wheezing or shortness of breath. (Patient not taking: Reported on 04/30/2022) 75 mL 12    albuterol (VENTOLIN HFA) 108 (90 Base) MCG/ACT inhaler Inhale 2 puffs into the lungs every 6 (six) hours as needed for wheezing or shortness of breath. (Patient not taking: Reported on 04/30/2022) 8 g 6   FLUoxetine (PROZAC) 40 MG capsule Take 1 capsule (40 mg total) by mouth daily. (Patient not taking: Reported on 08/13/2022) 90 capsule 1   valACYclovir (VALTREX) 500 MG tablet TAKE 1 TABLET BY MOUTH THEN TAKE 1 TABLET 12 HOURS LATER (Patient not taking: Reported on 08/13/2022) 180 tablet 0   No facility-administered medications prior to visit.    Allergies  Allergen Reactions   Penicillins Anaphylaxis and Rash   Sulfa Antibiotics Anaphylaxis and Rash    ROS As per HPI  PE:    08/13/2022    8:04 AM 07/14/2022    8:13 AM 07/10/2022    3:55 PM  Vitals with BMI  Height 5' 4"  5' 4"  5' 4"   Weight 213 lbs 13 oz 213 lbs   BMI 314.48318.56  Systolic [id number] Diastolic 67 68 73  Pulse 65 84 92     Physical Exam  Engine motion of the left knee is fully intact.  She has minimal soft tissue prominence over the pes anserine bursa region.  She has tenderness over this area focally and also some medial joint line tenderness of her left knee.  There is no erythema or warmth.  I do not feel any fluctuance to suggest fluid anywhere.  McMurray's is positive.  LABS:  Last CBC Lab Results  Component Value Date   WBC 5.1 04/30/2022   HGB 13.8 04/30/2022   HCT 41.5 04/30/2022   MCV 99.1 04/30/2022   RDW 16.2 (H) 04/30/2022   PLT 321.0 04/30/2022   Lab Results  Component Value Date   IRON 92 10/30/2021   TIBC 402 10/30/2021   FERRITIN 45 178/58/8502  Last metabolic panel Lab Results  Component Value Date   GLUCOSE 85 04/30/2022   NA 140 04/30/2022   K 4.4 04/30/2022   CL 102 04/30/2022   CO2 29 04/30/2022   BUN 13 04/30/2022   CREATININE 0.82 04/30/2022   CALCIUM 9.9 04/30/2022   PROT 6.8 04/30/2022   ALBUMIN 4.3 04/30/2022   BILITOT 0.3 04/30/2022   ALKPHOS 84 04/30/2022    AST 18 04/30/2022   ALT 26 04/30/2022      IMPRESSION AND  PLAN:  #1 medial knee pain.  Seems to be coming largely from her pes anserine tendons/bursa region.  Also some joint line tenderness.  She is improving with use of the sturdy splint.  She could not afford PT.  We will have her continue with the splint and wean off of it gradually to see how things go.  #2 muscle cramps.  Says all of her toes cramp up at night a lot lately. Recommended she start an over-the-counter magnesium tab every night and start 3 ounces of tonic water in the evening.  An After Visit Summary was printed and given to the patient.  FOLLOW UP: No follow-ups on file.  Signed:  Crissie Sickles, MD           08/13/2022

## 2022-09-02 NOTE — Patient Instructions (Addendum)
Please continue using your CPAP regularly. While your insurance requires that you use CPAP at least 4 hours each night on 70% of the nights, I recommend, that you not skip any nights and use it throughout the night if you can. Getting used to CPAP and staying with the treatment long term does take time and patience and discipline. Untreated obstructive sleep apnea when it is moderate to severe can have an adverse impact on cardiovascular health and raise her risk for heart disease, arrhythmias, hypertension, congestive heart failure, stroke and diabetes. Untreated obstructive sleep apnea causes sleep disruption, nonrestorative sleep, and sleep deprivation. This can have an impact on your day to day functioning and cause daytime sleepiness and impairment of cognitive function, memory loss, mood disturbance, and problems focussing. Using CPAP regularly can improve these symptoms.  I will reduce max pressure from 20 to 19cmH20 to see if this helps with air blowing. Let me know if you are not adjusting well.   Follow up in 1 year

## 2022-09-02 NOTE — Progress Notes (Signed)
Dr Dohmeier's    PATIENT: Olivia Mckee DOB: [birth year]  REASON FOR VISIT: follow up HISTORY FROM: patient  Chief Complaint  Patient presents with   Follow-up    Pt in room #1 and alone. Pt here today for f/u sleep apnea. Pt states she has issue with her CPAP machine, its blowing to much air.    HISTORY OF PRESENT ILLNESS:  09/07/22 ALL:  [name] returns for follow up for complex sleep apnea, mostly obstructive,  on CPAP. She was last seen 02/2022 and having more daytime sleepiness and generalized fatigue. I increased max pressure from 18 to 20 as she was consistently needing max pressure and AHI 4. Since, she reports doing very well. She is sleeping much better. She does feel that air pressure is too high. She wakes at night an feels her ears are popping. She will cut the CPAP off and restart so pressure is not as strong. Otherwise, doing great.   She has not taken alprazolam in a month. She reports stress levels are significantly better. Her grandson has moved back in with his mother in [place].     03/16/2022 ALL: [name] returns for follow up for OSA on CPAP. She continues to use CPAP most every night. She reports not sleeping well. She wakes feeling really tired. She usually takes a nap around 11am for "a couple of hours." She goes to bed around 8:30 and sleeps until around 6-6:30. She has a FFM size medium. She feels that it does not have a good seal at times. She has contacted DME and is expecting a new mask. She is raising her grandsons. She does endorse more stress. She is taking alprazolam 0.25m up to three times a day (usually 164mat 3pm) and Ambien 1022maily around 8:30.     10/28/2021 ALL:  Olivia Mckee a 61 81o. female here today for follow up for OSA on CPAP.  HST 09/03/2021 showed severe, complex sleep apnea with mostly obstructive events. AHI was 48.8/hr. O2 nadir 73% with 214 minutes of hypoxia noted. She was started on AutoPAP. She reports doing fairly well. She is  not dozing off as much during the day. She wakes feeling more refreshed. She continues to follow with rheumatology for rheumatoid arthritis versus fibromyalgia.     HISTORY: (copied from Dr Dohmeier's previous note)  Olivia Mckee a 60 79o. year old White or Caucasian female patient seen here as a referral on 07/23/2021 :   Chief concern according to patient :  " they say I have sleep apnea, I snore all my adult  life". 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 I am raising my 2 grandsons. I am worried, overwhelmed at times, I am sleeping a lot'- I am more concerned about memory- "    I have the pleasure of seeing Olivia Drollingerday, a right-handed White or Caucasian female with a possible sleep disorder.  She has a  has a past medical history of Alcoholism ([place], Anxiety and depression, Asthma, Cervical cancer ([place]ol[place]olithiasis, rheumatoid arthritis, MTX-   COPD (chronic obstructive pulmonary disease) ([place] ([place]), Elevated LFTs, GERD (gastroesophageal reflux disease), History of adenomatous polyp of colon (04/02/2014), History of pyelonephritis (12/2019),  Hypercholesterolemia, Insomnia, Iron deficiency anemia (04/2021), Osteoporosis, Perforation of cecum, and Ventral hernia..    Sleep relevant medical history: Nocturia: RLS, anemia,  Tonsillectomy,  deviated septum nasal reconstruction-    Family medical /sleep history: mother  with OSA, insomnia, sleep walkers.    Social history:  Patient is working as  and lives in a household with husband and 2 grand children- her daughter was a cocaine user- at the time 6 months and age 44.  The patient currently is staying home- Tobacco use/ quit 15 years ago .  ETOH use / sober for 6 years ,  Caffeine intake in form of Coffee( /) Soda( 2 cans in Pm ) Tea ( /) or energy drinks. Regular exercise in form of swimming. .   Hobbies : cooking.   Sleep habits are as follows: The patient's dinner time is between 5-7 PM.  The patient goes to bed at 9 PM and continues to sleep for 2-3 hours, wakes for 1-2 bathroom breaks, the first time at 12 AM.   The preferred sleep position is sideways, with the support of 1 pillow.  Bed is raised too .Dreams are reportedly frequent/vivid.  5.30  AM is the usual rise time. The patient wakes up spontaneously.  He/ She reports not feeling refreshed or restored in AM, with symptoms such as dry mouth, morning headaches, and residual fatigue.  Naps are taken frequently, unscheduled, lasting from 15 to 120 minutes and are more refreshing than nocturnal sleep.    REVIEW OF SYSTEMS: Out of a complete 14 system review of symptoms, the patient complains only of the following symptoms, generalized pain, fatigue and all other reviewed systems are negative.  ESS: now 3/24, previously 14/24  ALLERGIES: Allergies  Allergen Reactions   Penicillins Anaphylaxis and Rash   Sulfa Antibiotics Anaphylaxis and Rash    HOME MEDICATIONS: Outpatient Medications Prior to Visit  Medication Sig Dispense Refill   albuterol (PROVENTIL) (2.5 MG/3ML) 0.083% nebulizer solution Take 3 mLs (2.5 mg total) by nebulization every 6 (six) hours as needed for wheezing or shortness of breath. 75 mL 12   albuterol (VENTOLIN HFA) 108 (90 Base) MCG/ACT inhaler Inhale 2 puffs into the lungs every 6 (six) hours as needed for wheezing or shortness of breath. 8 g 6   ALPRAZolam (XANAX) 0.5 MG tablet Take 1 tablet (0.5 mg total) by mouth 3 (three) times daily as needed. 90 tablet 5   B-D TB SYRINGE 1CC/27GX1/2" 27G X 1/2" 1 ML MISC Inject into the skin.     cyclobenzaprine (FLEXERIL) 10 MG tablet TAKE ONE TABLET BY MOUTH 2 TIMES A DAY AS NEEDED FOR MUSCLE SPASMS. 60 tablet 1   FERROUS SULFATE PO Take 325 mg by mouth daily.     fluconazole (DIFLUCAN) 150 MG tablet 1 tab po x 1 dose, then repeat in 1 week 2 tablet 0   FLUoxetine (PROZAC) 40 MG capsule Take 1 capsule (40 mg total) by mouth daily. 90 capsule 1   fluticasone  (FLONASE) 50 MCG/ACT nasal spray Place 1 spray into both nostrils daily. 16 g 2   Glycopyrrolate-Formoterol (BEVESPI AEROSPHERE) 9-4.8 MCG/ACT AERO Take 2 puffs by mouth in the morning and at bedtime. 10.7 g 11   leucovorin (WELLCOVORIN) 5 MG tablet Take 5 mg by mouth daily.     meloxicam (MOBIC) 15 MG tablet Take 15 mg by mouth daily.     methotrexate 250 MG/10ML injection 0.30m     montelukast (SINGULAIR) 10 MG tablet Take 10 mg by mouth at bedtime.     pantoprazole (PROTONIX) 40 MG tablet Take 1 tablet (40 mg total) by mouth 2 (two) times daily. 180 tablet 1   pravastatin (PRAVACHOL) 10 MG tablet Take 1 tablet (10 mg total) by mouth daily. 90 tablet 1   traMADol (  ULTRAM) 50 MG tablet Take 1 tablet (50 mg total) by mouth 4 (four) times daily as needed. 90 tablet 5   valACYclovir (VALTREX) 500 MG tablet TAKE 1 TABLET BY MOUTH THEN TAKE 1 TABLET 12 HOURS LATER 180 tablet 0   zolpidem (AMBIEN) 10 MG tablet Take 1 tablet (10 mg total) by mouth at bedtime as needed. 30 tablet 5   No facility-administered medications prior to visit.    PAST MEDICAL HISTORY: Past Medical History:  Diagnosis Date   Alcoholism ([place])    Allergic rhinitis    Anxiety and depression    Arthritis of knee    bilat, CPPD+ OA   Asthma    Cervical cancer ([place])    Choledocholithiasis    COPD (chronic obstructive pulmonary disease) ([place])    Diverticulosis 2015   Sigmoid colon (noted on colonoscopy)   Elevated LFTs    GERD (gastroesophageal reflux disease)    History of adenomatous polyp of colon 04/02/2014   Recall 5 yrs   History of pyelonephritis 12/2019   Hypercholesterolemia    Inflammatory arthritis    +mild elev ESR, Rheum eval 07/2020: rapid resp to prednisone, felt to have inflamm arth + OA and CCPD bilat knees. Methotrexate INJ q week.   Insomnia    Iron deficiency anemia 04/2021   started iron, hemoccults x 3 NEG   Lumbar spondylosis    Malignant tumor of cervix ([place]) 07/08/2021   Hx of @ age 76. HYST  Removal Reason: Cx Cancer   Myalgia, unspecified site    + arthralgias---she is tender EVERYWHERE (summer 2021)->rheum ref   OSA (obstructive sleep apnea) 08/2021   2022 sleep study (Dr. Brett Fairy) severe, with oxygen nadir of 73%. CPAP.   Osteoarthritis, knee    Osteoporosis    Perforation of cecum    perf'd in context of SBO that followed GB surgery; no colon removed.   Ventral hernia     PAST SURGICAL HISTORY: Past Surgical History:  Procedure Laterality Date   BREAST BIOPSY  2010   benign cyst   CHOLECYSTECTOMY     COLONOSCOPY  04/02/2014   2015 adenoma x 2->recall 5 yrs ([facility])   Iliostomy     +  takedown   MOUTH SURGERY     NOSE SURGERY     PARTIAL HYSTERECTOMY  1983   perfor colon repair     SBO after gallbladder surgery   POLYSOMNOGRAPHY  08/2021   Sleep study showed severe OSA, CPAP recommended.   SALPINGOOPHORECTOMY Bilateral 05/10/2014   TONSILLECTOMY     VENTRAL HERNIA REPAIR  2016   open, with mesh    FAMILY HISTORY: Family History  Problem Relation Age of Onset   Diabetes Mother    Hypertension Mother    Hearing loss Mother    Depression Mother    Diabetes Father    Hearing loss Father    Early death Father    Stroke Maternal Grandmother    Kidney disease Maternal Grandmother    Diabetes Maternal Grandmother    Heart disease Maternal Grandmother    Arthritis Maternal Grandmother    Stroke Maternal Grandfather    Prostate cancer Maternal Grandfather    Diabetes Maternal Grandfather    Heart disease Maternal Grandfather    Arthritis Maternal Grandfather    Alcohol abuse Maternal Grandfather    Depression Maternal Grandfather    Diabetes Paternal Grandmother    Alcohol abuse Paternal Grandmother    Diabetes Paternal Grandfather    Alcohol  abuse Paternal Grandfather    Heart disease Paternal Grandfather     SOCIAL HISTORY: Social History   Socioeconomic History   Marital status: Married    Spouse name: Dominica Severin   Number of children: 1    Years of education: Not on file   Highest education level: GED or equivalent  Occupational History   Not on file  Tobacco Use   Smoking status: Former    Packs/day: 1.00    Years: 25.00    Total pack years: 25.00    Types: Cigarettes    Quit date: 11/23/2010    Years since quitting: 11.7   Smokeless tobacco: Never  Substance and Sexual Activity   Alcohol use: No   Drug use: No   Sexual activity: Not on file  Other Topics Concern   Not on file  Social History Narrative   Married, 1 daughter, several grandkids (2 of whom pt is raising).   Educ: some college   Occup: retired Development worker, international aid.   Tob: 20 pack-yr hx, quit approx 2005.   Alc: hx of alcoholism, quit 2018.   No hx of drug abuse.      left handed   Caffeine: 2 12oz can a day   Social Determinants of Health   Financial Resource Strain: Low Risk  (10/26/2021)   Overall Financial Resource Strain (CARDIA)    Difficulty of Paying Living Expenses: Not very hard  Food Insecurity: No Food Insecurity (10/26/2021)   Hunger Vital Sign    Worried About Running Out of Food in the Last Year: Never true    Ran Out of Food in the Last Year: Never true  Transportation Needs: No Transportation Needs (10/26/2021)   PRAPARE - Hydrologist (Medical): No    Lack of Transportation (Non-Medical): No  Physical Activity: Insufficiently Active (10/26/2021)   Exercise Vital Sign    Days of Exercise per Week: 3 days    Minutes of Exercise per Session: 30 min  Stress: Stress Concern Present (10/26/2021)   [place]    Feeling of Stress : Very much  Social Connections: Moderately Isolated (10/26/2021)   Social Connection and Isolation Panel [NHANES]    Frequency of Communication with Friends and Family: More than three times a week    Frequency of Social Gatherings with Friends and Family: Never    Attends Religious Services: Never    Corporate treasurer or Organizations: No    Attends Music therapist: Not on file    Marital Status: Married  Human resources officer Violence: Not on file     PHYSICAL EXAM  Vitals:   09/07/22 0926  BP: (!) 140/84  Pulse: (!) 58  Weight: 217 lb 8 oz (98.7 kg)  Height: 5' 3"  (1.6 m)     Body mass index is 38.53 kg/m.  Generalized: Well developed, in no acute distress  Cardiology: normal rate and rhythm, no murmur noted Respiratory: clear to auscultation bilaterally  Neurological examination  Mentation: Alert oriented to time, place, history taking. Follows all commands speech and language fluent Cranial nerve II-XII: Pupils were equal round reactive to light. Extraocular movements were full, visual field were full  Motor: The motor testing reveals 5 over 5 strength of all 4 extremities. Good symmetric motor tone is noted throughout.  Gait and station: Gait is normal.    DIAGNOSTIC DATA (LABS, IMAGING, TESTING) - I reviewed patient records, labs, notes, testing and imaging  myself where available.      No data to display           Lab Results  Component Value Date   WBC 5.1 04/30/2022   HGB 13.8 04/30/2022   HCT 41.5 04/30/2022   MCV 99.1 04/30/2022   PLT 321.0 04/30/2022      Component Value Date/Time   NA 140 04/30/2022 0940   K 4.4 04/30/2022 0940   CL 102 04/30/2022 0940   CO2 29 04/30/2022 0940   GLUCOSE 85 04/30/2022 0940   BUN 13 04/30/2022 0940   CREATININE 0.82 04/30/2022 0940   CALCIUM 9.9 04/30/2022 0940   PROT 6.8 04/30/2022 0940   ALBUMIN 4.3 04/30/2022 0940   AST 18 04/30/2022 0940   ALT 26 04/30/2022 0940   ALKPHOS 84 04/30/2022 0940   BILITOT 0.3 04/30/2022 0940   Lab Results  Component Value Date   CHOL 186 04/30/2022   HDL 68.70 04/30/2022   LDLCALC 90 04/30/2022   LDLDIRECT 100.0 04/25/2021   TRIG 138.0 04/30/2022   CHOLHDL 3 04/30/2022   No results found for: "HGBA1C" No results found for: "VITAMINB12" Lab Results  Component  Value Date   TSH 2.05 04/25/2021     ASSESSMENT AND PLAN 61 y.o. year old female  has a past medical history of Alcoholism ([place]), Allergic rhinitis, Anxiety and depression, Arthritis of knee, Asthma, Cervical cancer ([place]), Choledocholithiasis, COPD (chronic obstructive pulmonary disease) ([place]), Diverticulosis (2015), Elevated LFTs, GERD (gastroesophageal reflux disease), History of adenomatous polyp of colon (04/02/2014), History of pyelonephritis (12/2019), Hypercholesterolemia, Inflammatory arthritis, Insomnia, Iron deficiency anemia (04/2021), Lumbar spondylosis, Malignant tumor of cervix ([place]) (07/08/2021), Myalgia, unspecified site, OSA (obstructive sleep apnea) (08/2021), Osteoarthritis, knee, Osteoporosis, Perforation of cecum, and Ventral hernia. here with     ICD-10-CM   1. Complex sleep apnea syndrome  G47.31 For home use only DME continuous positive airway pressure (CPAP)    2. Sleep apnea with use of continuous positive airway pressure (CPAP)  G47.30         Adylee Leonardo is doing well on CPAP therapy. She does note increased pressure at periods during the night and feels this is making her ears pop. I will decrease max pressure to 19cmH20. AHI now 2.9/hr. She was encouraged to continue using CPAP nightly and for greater than 4 hours each night. We will update supply orders as indicated. Risks of untreated sleep apnea review and education materials provided. Healthy lifestyle habits encouraged. She will follow up in 4-6 months, sooner if needed. She verbalizes understanding and agreement with this plan.    Orders Placed This Encounter  Procedures   For home use only DME continuous positive airway pressure (CPAP)    Supplies and please decrease max pressure from 20 to 19cmH20.    Order Specific Question:   Length of Need    Answer:   Lifetime    Order Specific Question:   Patient has OSA or probable OSA    Answer:   Yes    Order Specific Question:   Is the patient currently  using CPAP in the home    Answer:   Yes    Order Specific Question:   Settings    Answer:   Other see comments    Order Specific Question:   CPAP supplies needed    Answer:   Mask, headgear, cushions, filters, heated tubing and water chamber      No orders of the defined types were placed in this encounter.  Debbora Presto, FNP-C 09/07/2022, 9:59 AM [facility] [address], [place], [place] [address] [phone]

## 2022-09-03 ENCOUNTER — Telehealth: Payer: Self-pay

## 2022-09-03 NOTE — Telephone Encounter (Signed)
Called pt to bring CPAP machine with power cord to be able to get data. Pt verbalized understanding. Pt had no questions at this time but was encouraged to call back if questions arise.

## 2022-09-07 ENCOUNTER — Encounter: Payer: Self-pay | Admitting: Family Medicine

## 2022-09-07 ENCOUNTER — Ambulatory Visit (INDEPENDENT_AMBULATORY_CARE_PROVIDER_SITE_OTHER): Payer: Commercial Managed Care - PPO | Admitting: Family Medicine

## 2022-09-07 VITALS — BP 140/84 | HR 58 | Ht 63.0 in | Wt 217.5 lb

## 2022-09-07 DIAGNOSIS — G4731 Primary central sleep apnea: Secondary | ICD-10-CM | POA: Diagnosis not present

## 2022-09-07 DIAGNOSIS — G4730 Sleep apnea, unspecified: Secondary | ICD-10-CM | POA: Diagnosis not present

## 2022-09-08 ENCOUNTER — Telehealth: Payer: Self-pay

## 2022-09-17 ENCOUNTER — Encounter: Payer: Self-pay | Admitting: Family Medicine

## 2022-09-17 NOTE — Telephone Encounter (Signed)
Noted  

## 2022-09-23 ENCOUNTER — Other Ambulatory Visit: Payer: Self-pay | Admitting: Family Medicine

## 2022-09-25 ENCOUNTER — Telehealth: Payer: Self-pay | Admitting: Emergency Medicine

## 2022-09-25 NOTE — Telephone Encounter (Signed)
Called patient but she did not answer. Left message for her to call back.  

## 2022-09-28 MED ORDER — DOXYCYCLINE HYCLATE 100 MG PO TABS: 100.0000 mg | ORAL_TABLET | Freq: Two times a day (BID) | ORAL | 0 refills | Status: DC

## 2022-09-28 NOTE — Telephone Encounter (Signed)
Please check to see if she is still having sx. If so then would ask her to take doxycycline '100mg'$  bid x 7 days. If worsening in any way she needs to call us to be seen or go to UC.

## 2022-09-28 NOTE — Telephone Encounter (Signed)
Called and spoke with patient. She verbalized understanding. RX for doxy has been sent in for her.   Nothing further needed.

## 2022-09-28 NOTE — Telephone Encounter (Signed)
Spoke with pt who states she has had chest heaviness, SOB, productive cough and runny nose. Pt denies fever/ chills/ GI upset. Pt states she tried NyQuil over the weekend but it did not help.  Pt states taking all meds as directed and using both Albuterol inhaler and neb yesterday. Dr. Lamonte Sakai please advise.

## 2022-09-28 NOTE — Telephone Encounter (Signed)
Patient is returning phone call. Patient phone number is [phone].

## 2022-10-05 ENCOUNTER — Encounter: Payer: Self-pay | Admitting: Family Medicine

## 2022-10-07 ENCOUNTER — Telehealth: Payer: Self-pay | Admitting: Emergency Medicine

## 2022-10-07 NOTE — Telephone Encounter (Signed)
Called and spoke with pt letting her know info per RB and she verbalized understanding. Appt scheduled for pt tomorrow 11/16 with [place]. Nothing further needed.

## 2022-10-07 NOTE — Telephone Encounter (Signed)
Sorry - she needs to be seen if she has failed abx

## 2022-10-07 NOTE — Telephone Encounter (Signed)
Spoke with the Olivia Mckee  She finished doxy 10/06/22  She c/o cough with green sputum x 3 days  Denies fever, aches She is also having mild wheezing and slight increase in SOB  She is taking her bevespi every day, as wel as the singulair, albuterol, flonase, claritin  She is scheduled with RB for 11/05/22 She does not wish to come in sooner, and no openings at all rest of this wk  Please advise thanks  Allergies  Allergen Reactions   Penicillins Anaphylaxis and Rash   Sulfa Antibiotics Anaphylaxis and Rash

## 2022-10-08 ENCOUNTER — Encounter: Payer: Self-pay | Admitting: Nurse Practitioner

## 2022-10-08 ENCOUNTER — Ambulatory Visit (INDEPENDENT_AMBULATORY_CARE_PROVIDER_SITE_OTHER): Payer: Commercial Managed Care - PPO | Admitting: Nurse Practitioner

## 2022-10-08 ENCOUNTER — Ambulatory Visit (INDEPENDENT_AMBULATORY_CARE_PROVIDER_SITE_OTHER): Payer: Commercial Managed Care - PPO

## 2022-10-08 VITALS — BP 118/72 | HR 85 | Ht 65.0 in | Wt 222.6 lb

## 2022-10-08 DIAGNOSIS — J301 Allergic rhinitis due to pollen: Secondary | ICD-10-CM | POA: Diagnosis not present

## 2022-10-08 DIAGNOSIS — J440 Chronic obstructive pulmonary disease with acute lower respiratory infection: Secondary | ICD-10-CM

## 2022-10-08 DIAGNOSIS — J209 Acute bronchitis, unspecified: Secondary | ICD-10-CM | POA: Insufficient documentation

## 2022-10-08 DIAGNOSIS — J441 Chronic obstructive pulmonary disease with (acute) exacerbation: Secondary | ICD-10-CM | POA: Diagnosis not present

## 2022-10-08 MED ORDER — BENZONATATE 200 MG PO CAPS: 200.0000 mg | ORAL_CAPSULE | Freq: Three times a day (TID) | ORAL | 1 refills | Status: DC | PRN

## 2022-10-08 MED ORDER — PREDNISONE 10 MG PO TABS: ORAL_TABLET | ORAL | 0 refills | Status: DC

## 2022-10-08 NOTE — Patient Instructions (Addendum)
Continue Albuterol inhaler 2 puffs or 3 mL neb every 6 hours as needed for shortness of breath or wheezing. Notify if symptoms persist despite rescue inhaler/neb use. Continue Bevespi 2 puffs Twice daily  Continue fluticasone nasal spray spray each nostril daily Continue montelukast 1 tab At bedtime  Continue protonix 1 tab Twice daily for reflux  Prednisone taper. 4 tabs for 2 days, then 3 tabs for 2 days, 2 tabs for 2 days, then 1 tab for 2 days, then stop. Take in AM with food.  Benzonatate 1 capsule Three times a day for cough  Guaifenesin [phone] mg Twice daily for chest congestion/cough Saline nasal irritation 1-2 times a day before your steroid nasal spray   Follow up in 2 weeks with Dr. Lamonte Sakai or Katie Jayley Hustead,NP. If symptoms do not improve or worsen, please contact office for sooner follow up or seek emergency care.

## 2022-10-08 NOTE — Assessment & Plan Note (Signed)
See above plan. May need to consider RAST testing and adjustment of daily antihistamine if symptoms persist. Continue singulair for trigger prevention.

## 2022-10-08 NOTE — Assessment & Plan Note (Signed)
Unresolved AECOPD/acute bronchitis; likely brought on by URI. Possible there is an allergic component given she flares during fall and spring? She has been covered from an abx steroid and CXR without evidence of superimposed infection. We will treat her with steroid taper and cough control measures to limit further airway irritation. I do suspect that a component of her cough is related to postnasal drainage. We will add on saline nasal rinses followed by intranasal steroid.   Patient Instructions  Continue Albuterol inhaler 2 puffs or 3 mL neb every 6 hours as needed for shortness of breath or wheezing. Notify if symptoms persist despite rescue inhaler/neb use. Continue Bevespi 2 puffs Twice daily  Continue fluticasone nasal spray spray each nostril daily Continue montelukast 1 tab At bedtime  Continue protonix 1 tab Twice daily for reflux  Prednisone taper. 4 tabs for 2 days, then 3 tabs for 2 days, 2 tabs for 2 days, then 1 tab for 2 days, then stop. Take in AM with food.  Benzonatate 1 capsule Three times a day for cough  Guaifenesin [phone] mg Twice daily for chest congestion/cough Saline nasal irritation 1-2 times a day before your steroid nasal spray   Follow up in 2 weeks with Dr. Lamonte Sakai or Katie Shawonda Kerce,NP. If symptoms do not improve or worsen, please contact office for sooner follow up or seek emergency care.

## 2022-10-08 NOTE — Progress Notes (Signed)
_0  ID: Olivia Mckee, female    DOB: Feb 09, 1961, 61 y.o.   MRN: [id number]  Chief Complaint  Patient presents with   Follow-up    Pt acute she states her chest is heavy, nasal/chest congest, productive cough w/ yellow phlegm. She also reports she is wheezing. Pt finished doxycyline on Monday/Tuesday    Referring provider: Tammi Sou, MD  HPI: 61 year old female, former smoker followed for COPD. She is a patient of Dr. Agustina Caroli and last seen in office 01/26/2022 by Dr. Shearon Stalls for acute visit. Past medical history significant for allergic rhinitis, OSA followed by Dr. Dolphus Jenny, GERD, RA, GAD, HLD.  TEST/EVENTS:  10/21/2017 PFT: FVC 84, FEV1 75, ratio 70, TLC 106, DLCOcor 87 09/03/2021 HST: AHI 48.9   01/26/2022: OV with Dr. Shearon Stalls for acute visit. Seen 1 year prior by Dr. Erin Fulling for acute asthma exacerbation. Not much better since last week. She's already been treated with prednisone taper and abx z2. Finishing prednisone previously prescribed. Nasonex is helping with rhinitis symptoms; nose feels pretty dry. She doesn't feel like nebulizer treatments are helping. O2 sats ok. Appetite ok. AECOPD; not resolving. Extend pred taper; suspected viral trigger. No further abx. Benzonatate for cough.   10/08/2022: Today - acute Patient presents today for acute visit.  She originally contacted the office on 11/3 with increased productive cough.  She was treated with doxycycline 7-day course, which she completed a few days ago.  Contacted the office yesterday, 11/15 with unresolved symptoms.  Today, she tells me she is still having a productive cough with green sputum.  She also having some mild wheezing and slight increase in shortness of breath.  Feels like her nose is a little more congested than normal.  She is blowing it often with green mucus. Denies fevers, chills, hemoptysis, headaches, sore throat, leg swelling, orthopnea. She is using her Bevespi Twice daily. Having to use albuterol  daily. She tends to have a flare in spring and fall. Eating and drinking well. Feeling more tired/run down than usual. No viral testing completed at onset of symptoms.   Allergies  Allergen Reactions   Penicillins Anaphylaxis and Rash   Sulfa Antibiotics Anaphylaxis and Rash    Immunization History  Administered Date(s) Administered   Influenza Split 12/23/2015, 07/31/2016   Influenza, Quadrivalent, Recombinant, Inj, Pf 07/28/2019   Influenza, Seasonal, Injecte, Preservative Fre 11/13/2014, 12/23/2015, 07/31/2016   Influenza,inj,Quad PF,6+ Mos 09/08/2018, 07/28/2019, 10/10/2021, 07/14/2022   Influenza-Unspecified 11/13/2014, 09/02/2020   Moderna SARS-COV2 Booster Vaccination 10/07/2020, 02/20/2021, 09/04/2021   Moderna Sars-Covid-2 Vaccination 03/07/2020, 04/04/2020   PNEUMOCOCCAL CONJUGATE-20 04/24/2021   Pneumococcal Polysaccharide-23 03/25/2017, 03/28/2020   Tdap 11/28/2013   Unspecified SARS-COV-2 Vaccination 02/05/2020, 05/07/2020, 11/06/2020, 02/04/2021   Zoster Recombinat (Shingrix) 07/28/2019, 07/28/2019, 11/22/2019    Past Medical History:  Diagnosis Date   Alcoholism ([place])    Allergic rhinitis    Anxiety and depression    Arthritis of knee    bilat, CPPD+ OA   Asthma    Cervical cancer ([place])    Choledocholithiasis    COPD (chronic obstructive pulmonary disease) ([place])    Diverticulosis 2015   Sigmoid colon (noted on colonoscopy)   Elevated LFTs    GERD (gastroesophageal reflux disease)    History of adenomatous polyp of colon 04/02/2014   Recall 5 yrs   History of pyelonephritis 12/2019   Hypercholesterolemia    Inflammatory arthritis    +mild elev ESR, Rheum eval 07/2020: rapid resp to prednisone, felt to have inflamm arth +  OA and CCPD bilat knees. Methotrexate INJ q week.   Insomnia    Iron deficiency anemia 04/2021   started iron, hemoccults x 3 NEG   Lumbar spondylosis    Malignant tumor of cervix ([place]) 07/08/2021   Hx of @ age 49. HYST Removal Reason: Cx  Cancer   Myalgia, unspecified site    + arthralgias---she is tender EVERYWHERE (summer 2021)->rheum ref   OSA (obstructive sleep apnea) 08/2021   2022 sleep study (Dr. Brett Fairy) severe, with oxygen nadir of 73%. CPAP.   Osteoarthritis, knee    Osteoporosis    Perforation of cecum    perf'd in context of SBO that followed GB surgery; no colon removed.   Ventral hernia     Tobacco History: Social History   Tobacco Use  Smoking Status Former   Packs/day: 1.00   Years: 25.00   Total pack years: 25.00   Types: Cigarettes   Quit date: 11/23/2010   Years since quitting: 11.8  Smokeless Tobacco Never   Counseling given: Not Answered   Outpatient Medications Prior to Visit  Medication Sig Dispense Refill   albuterol (PROVENTIL) (2.5 MG/3ML) 0.083% nebulizer solution Take 3 mLs (2.5 mg total) by nebulization every 6 (six) hours as needed for wheezing or shortness of breath. 75 mL 12   albuterol (VENTOLIN HFA) 108 (90 Base) MCG/ACT inhaler Inhale 2 puffs into the lungs every 6 (six) hours as needed for wheezing or shortness of breath. 8 g 6   ALPRAZolam (XANAX) 0.5 MG tablet Take 1 tablet (0.5 mg total) by mouth 3 (three) times daily as needed. 90 tablet 5   B-D TB SYRINGE 1CC/27GX1/2" 27G X 1/2" 1 ML MISC Inject into the skin.     cyclobenzaprine (FLEXERIL) 10 MG tablet TAKE ONE TABLET BY MOUTH 2 TIMES A DAY AS NEEDED FOR MUSCLE SPASMS. 60 tablet 1   FERROUS SULFATE PO Take 325 mg by mouth daily.     fluconazole (DIFLUCAN) 150 MG tablet 1 tab po x 1 dose, then repeat in 1 week 2 tablet 0   FLUoxetine (PROZAC) 40 MG capsule Take 1 capsule (40 mg total) by mouth daily. 90 capsule 1   fluticasone (FLONASE) 50 MCG/ACT nasal spray Place 1 spray into both nostrils daily. 16 g 2   Glycopyrrolate-Formoterol (BEVESPI AEROSPHERE) 9-4.8 MCG/ACT AERO Take 2 puffs by mouth in the morning and at bedtime. 10.7 g 11   leucovorin (WELLCOVORIN) 5 MG tablet Take 5 mg by mouth daily.     meloxicam (MOBIC)  15 MG tablet Take 15 mg by mouth daily.     methotrexate 250 MG/10ML injection 0.71m     montelukast (SINGULAIR) 10 MG tablet Take 10 mg by mouth at bedtime.     pantoprazole (PROTONIX) 40 MG tablet Take 1 tablet (40 mg total) by mouth 2 (two) times daily. 180 tablet 1   pravastatin (PRAVACHOL) 10 MG tablet Take 1 tablet (10 mg total) by mouth daily. 90 tablet 1   traMADol (ULTRAM) 50 MG tablet Take 1 tablet (50 mg total) by mouth 4 (four) times daily as needed. 90 tablet 5   valACYclovir (VALTREX) 500 MG tablet TAKE 1 TABLET BY MOUTH THEN TAKE 1 TABLET 12 HOURS LATER 180 tablet 0   zolpidem (AMBIEN) 10 MG tablet Take 1 tablet (10 mg total) by mouth at bedtime as needed. 30 tablet 5   No facility-administered medications prior to visit.     Review of Systems:   Constitutional: No weight loss or gain,  night sweats, fevers, chills, or lassitude. +fatigue HEENT: No headaches, difficulty swallowing, tooth/dental problems, or sore throat. No sneezing, itching, ear ache. +nasal congestion, post nasal drip, purulent drainage CV:  No chest pain, orthopnea, PND, swelling in lower extremities, anasarca, dizziness, palpitations, syncope Resp: +shortness of breath with exertion; productive cough; wheezing. No hemoptysis. No chest wall deformity GI:  No heartburn, indigestion, abdominal pain, nausea, vomiting, diarrhea, change in bowel habits, loss of appetite, bloody stools.  GU: No dysuria, change in color of urine, urgency or frequency. Skin: No rash, lesions, ulcerations MSK:  No joint pain or swelling.   Neuro: No dizziness or lightheadedness.  Psych: No depression or anxiety. Mood stable.     Physical Exam:  BP 118/72   Pulse 85   Ht _0  (1.651 m)   Wt 222 lb 9.6 oz (101 kg)   SpO2 98%   BMI 37.04 kg/m   GEN: Pleasant, interactive, well-appearing; obese; in no acute distress. HEENT:  Normocephalic and atraumatic. PERRLA. Sclera white. Nasal turbinates pink, moist and patent  bilaterally. No rhinorrhea present. Oropharynx pink and moist, without exudate or edema. No lesions, ulcerations, or postnasal drip.  NECK:  Supple w/ fair ROM. No JVD present. Normal carotid impulses w/o bruits. Thyroid symmetrical with no goiter or nodules palpated. No lymphadenopathy.   CV: RRR, no m/r/g, no peripheral edema. Pulses intact, +2 bilaterally. No cyanosis, pallor or clubbing. PULMONARY:  Unlabored, regular breathing. Diminished bases bilaterally A&P w/o wheezes/rales/rhonchi. Bronchitic cough. No accessory muscle use.  GI: BS present and normoactive. Soft, non-tender to palpation. No organomegaly or masses detected. MSK: No erythema, warmth or tenderness. Cap refil <2 sec all extrem. No deformities or joint swelling noted.  Neuro: A/Ox3. No focal deficits noted.   Skin: Warm, no lesions or rashe Psych: Normal affect and behavior. Judgement and thought content appropriate.     Lab Results:  CBC    Component Value Date/Time   WBC 5.1 04/30/2022 0940   RBC 4.18 04/30/2022 0940   HGB 13.8 04/30/2022 0940   HCT 41.5 04/30/2022 0940   PLT 321.0 04/30/2022 0940   MCV 99.1 04/30/2022 0940   MCHC 33.4 04/30/2022 0940   RDW 16.2 (H) 04/30/2022 0940   LYMPHSABS 1.3 04/25/2021 0831   MONOABS 0.4 04/25/2021 0831   EOSABS 0.2 04/25/2021 0831   BASOSABS 0.0 04/25/2021 0831    BMET    Component Value Date/Time   NA 140 04/30/2022 0940   K 4.4 04/30/2022 0940   CL 102 04/30/2022 0940   CO2 29 04/30/2022 0940   GLUCOSE 85 04/30/2022 0940   BUN 13 04/30/2022 0940   CREATININE 0.82 04/30/2022 0940   CALCIUM 9.9 04/30/2022 0940    BNP No results found for: "BNP"   Imaging:  No results found.       Latest Ref Rng & Units 10/21/2017   10:16 AM  PFT Results  FVC-Pre L 2.56   FVC-Predicted Pre % 84   FVC-Post L 2.64   FVC-Predicted Post % 87   Pre FEV1/FVC % % 70   Post FEV1/FCV % % 70   FEV1-Pre L 1.79   FEV1-Predicted Pre % 75   FEV1-Post L 1.85   DLCO  uncorrected ml/min/mmHg 16.57   DLCO UNC% % 81   DLCO corrected ml/min/mmHg 17.69   DLCO COR %Predicted % 87   DLVA Predicted % 86   TLC L 4.91   TLC % Predicted % 106   RV % Predicted % 114  No results found for: "NITRICOXIDE"      Assessment & Plan:   Acute bronchitis with COPD ([place]) Unresolved AECOPD/acute bronchitis; likely brought on by URI. Possible there is an allergic component given she flares during fall and spring? She has been covered from an abx steroid and CXR without evidence of superimposed infection. We will treat her with steroid taper and cough control measures to limit further airway irritation. I do suspect that a component of her cough is related to postnasal drainage. We will add on saline nasal rinses followed by intranasal steroid.   Patient Instructions  Continue Albuterol inhaler 2 puffs or 3 mL neb every 6 hours as needed for shortness of breath or wheezing. Notify if symptoms persist despite rescue inhaler/neb use. Continue Bevespi 2 puffs Twice daily  Continue fluticasone nasal spray spray each nostril daily Continue montelukast 1 tab At bedtime  Continue protonix 1 tab Twice daily for reflux  Prednisone taper. 4 tabs for 2 days, then 3 tabs for 2 days, 2 tabs for 2 days, then 1 tab for 2 days, then stop. Take in AM with food.  Benzonatate 1 capsule Three times a day for cough  Guaifenesin [phone] mg Twice daily for chest congestion/cough Saline nasal irritation 1-2 times a day before your steroid nasal spray   Follow up in 2 weeks with Dr. Lamonte Sakai or Katie Honorio Devol,NP. If symptoms do not improve or worsen, please contact office for sooner follow up or seek emergency care.    Allergic rhinitis See above plan. May need to consider RAST testing and adjustment of daily antihistamine if symptoms persist. Continue singulair for trigger prevention.    I spent 35 minutes of dedicated to the care of this patient on the date of this encounter to include  pre-visit review of records, face-to-face time with the patient discussing conditions above, post visit ordering of testing, clinical documentation with the electronic health record, making appropriate referrals as documented, and communicating necessary findings to members of the patients care team.  Clayton Bibles, NP 10/08/2022  Pt aware and understands NP's role.

## 2022-10-13 ENCOUNTER — Telehealth: Payer: Self-pay | Admitting: *Deleted

## 2022-10-13 ENCOUNTER — Other Ambulatory Visit: Payer: Self-pay | Admitting: Pulmonary Disease

## 2022-10-13 ENCOUNTER — Other Ambulatory Visit: Payer: Self-pay | Admitting: Family Medicine

## 2022-10-13 DIAGNOSIS — J441 Chronic obstructive pulmonary disease with (acute) exacerbation: Secondary | ICD-10-CM

## 2022-10-13 DIAGNOSIS — J209 Acute bronchitis, unspecified: Secondary | ICD-10-CM

## 2022-10-13 MED ORDER — PREDNISONE 10 MG PO TABS: ORAL_TABLET | ORAL | 0 refills | Status: DC

## 2022-10-13 MED ORDER — DOXYCYCLINE HYCLATE 100 MG PO TABS: 100.0000 mg | ORAL_TABLET | Freq: Two times a day (BID) | ORAL | 0 refills | Status: DC

## 2022-10-13 NOTE — Telephone Encounter (Signed)
Pt was recently seen for an acute visit 11/16. Was prescribed benzonatate as well as a pred taper.  Called and spoke with pt who states she is still coughing and states she is still coughing up phlegm that is still green and yellow in color. Pt also states that she is still wheezing.  Pt said that she still has tessalon left and states she has been taking that. Pt said that she has one prednisone left which she states she will finish tomorrow but due to still having symptoms, she is wanting to know if she could have another round of prednisone prescribed.  Denies any complaints of any fever. Pt does have a f/u scheduled with Dr. Lamonte Sakai in December.  Sarah, please advise if you are okay with [place] sending another round of prednisone to the pharmacy for pt with the holidays coming up.

## 2022-10-13 NOTE — Telephone Encounter (Signed)
Olivia Spatz, NP  You11 minutes ago (2:39 PM)    Ok to send in another pred taper. Was she prescribed antibiotics for the discolored green and yellow  sputum? If not, then send in Doxycycline 100 mg , one tablet twice daily x 7 days. Must show up for follow up. Thanks    Pt was only prescribed pred taper and tessalon at last OV 11/16. Called and spoke with pt letting her know recs per SG and she verbalized understanding. Pred taper and doxy have been sent to preferred pharmacy. Nothing further needed.

## 2022-10-13 NOTE — Telephone Encounter (Signed)
patient called and states she is not feeling well. would like to have another prescription of prednisone.  Patient uses [facility] in [facility]  Please call and advise patient. [phone]

## 2022-10-27 ENCOUNTER — Other Ambulatory Visit: Payer: Self-pay | Admitting: Family Medicine

## 2022-10-27 NOTE — Telephone Encounter (Signed)
Requesting: zolpidem Contract:04/29/22 UDS: 10/30/21 Last Visit: 08/13/22 Next Visit: 11/12/22 Last Refill: 04/30/22(30,5)  Requesting: alprazolam Contract: 04/29/22 UDS: 10/30/21 Last Visit: 08/13/22 Next Visit: 11/12/22 Last Refill: 04/30/22 (90,5)  RF request for diflucan LOV:08/13/22 Next ov:  11/12/22 Last written: 07/14/22 (2,0)  Please advise. Meds pending

## 2022-10-28 NOTE — Telephone Encounter (Signed)
Pt advised refill sent. °

## 2022-10-28 NOTE — Telephone Encounter (Signed)
LM for pt regarding medication ?

## 2022-11-05 ENCOUNTER — Ambulatory Visit (INDEPENDENT_AMBULATORY_CARE_PROVIDER_SITE_OTHER): Payer: Commercial Managed Care - PPO | Admitting: Emergency Medicine

## 2022-11-05 ENCOUNTER — Encounter: Payer: Self-pay | Admitting: Emergency Medicine

## 2022-11-05 ENCOUNTER — Other Ambulatory Visit: Payer: Self-pay | Admitting: Family Medicine

## 2022-11-05 VITALS — BP 132/74 | HR 89 | Temp 98.4°F | Ht 64.0 in | Wt 227.4 lb

## 2022-11-05 DIAGNOSIS — J449 Chronic obstructive pulmonary disease, unspecified: Secondary | ICD-10-CM

## 2022-11-05 DIAGNOSIS — G4733 Obstructive sleep apnea (adult) (pediatric): Secondary | ICD-10-CM

## 2022-11-05 DIAGNOSIS — K219 Gastro-esophageal reflux disease without esophagitis: Secondary | ICD-10-CM

## 2022-11-05 DIAGNOSIS — R0602 Shortness of breath: Secondary | ICD-10-CM | POA: Diagnosis not present

## 2022-11-05 DIAGNOSIS — J301 Allergic rhinitis due to pollen: Secondary | ICD-10-CM | POA: Diagnosis not present

## 2022-11-05 NOTE — Progress Notes (Signed)
Subjective:    Patient ID: Olivia Mckee, female    DOB: December 30, 1960, 61 y.o.   MRN: [id number]  HPI  ROV 10/10/21 --follow-up visit for 61 year old woman with a history of moderate COPD, rheumatoid arthritis on methotrexate, chronic cough with GERD and allergic rhinitis.  This is her annual follow-up.  She reports that she has had flaring symptoms in the last month, treated with azithromycin and prednisone.  She has flares like this about 2-4 times a year.  Usually cough. She is still dealing with some residual throat clearing. Off her loratadine right now.  She has been maintained on Bevespi, uses albuterol approximately 2x a week, when she rushes and get SOB or before a walk She was started on CPAP by neurology, good compliance.  ROV 11/05/2022  -- Olivia Mckee is 12 and has a history of moderate COPD, OSA on CPAP.  She also has a history of rheumatoid arthritis on methotrexate, chronic cough with GERD and allergic rhinitis.  She was seen in our office 1 month ago with increased congestion and symptoms consistent with an acute bronchitis.  She also had a similar flare in March 2023.  Sound like her most recent flare was precipitated by URI.  She was treated with doxycycline, prednisone, guaifenesin, nasal rinses.  Managed with Bevespi, fluticasone nasal spray, Singulair, Protonix.  Using albuterol quite rarely.  She is feeling better. Able to exert. Still w a scratchy throat. She is considering starting daily loratadine. She is reliable w her CPAP.    Review of Systems  Constitutional:  Negative for fever and unexpected weight change.  HENT:  Negative for congestion, dental problem, ear pain, nosebleeds, postnasal drip, rhinorrhea, sinus pressure, sneezing, sore throat and trouble swallowing.   Eyes:  Negative for redness and itching.  Respiratory:  Positive for cough and shortness of breath. Negative for chest tightness and wheezing.   Cardiovascular:  Negative for palpitations and leg swelling.   Gastrointestinal:  Negative for nausea and vomiting.  Genitourinary:  Positive for dysuria and flank pain.  Musculoskeletal:  Negative for joint swelling.  Skin:  Negative for rash.  Neurological:  Negative for headaches.  Hematological:  Does not bruise/bleed easily.  Psychiatric/Behavioral:  Negative for dysphoric mood. The patient is not nervous/anxious.       Objective:   Physical Exam Vitals:   11/05/22 1010  BP: 132/74  Pulse: 89  Temp: 98.4 F (36.9 C)  TempSrc: Oral  SpO2: 97%  Weight: 227 lb 6.4 oz (103.1 kg)  Height: '5\' 4"'$  (1.626 m)   Gen: Pleasant, Overweight woman, in no distress,  normal affect, no cough or throat clearing  ENT: No lesions,  mouth clear,  oropharynx clear, no postnasal drip  Neck: No JVD, no overt stridor  Lungs: No use of accessory muscles, no wheeze on a normal breath, minimal referred upper airway noise  Cardiovascular: RRR, heart sounds normal, no murmur or gallops, no peripheral edema  Musculoskeletal: No deformities, no cyanosis or clubbing  Neuro: alert, non focal  Skin: Warm, no lesions or rashes     Assessment & Plan:  Chronic obstructive pulmonary disease (Olivia Mckee) From a recent exacerbation.  Sounds like these are happening about twice a year, when the seasons change.  Would be reasonable to consider adding an ICS as we go forward.  Continue Bevespi for now.  If she continues this pattern then we will change her to an alternative, possibly Breztri.  She is considering having surgery performed and we will arrange for  repeat pulmonary function testing to compare with 2018 to ensure no interval change, risk stratify.  Please continue Bevespi 2 puffs twice a day. Keep your albuterol available to use 2 puffs if needed for shortness of breath, chest tightness, wheezing. We will arrange for pulmonary function testing to compare with your priors Follow-up with APP in 6 months. Follow with Dr. Lamonte Mckee in 12 months or sooner if you have any  problems.   Allergic rhinitis Continue fluticasone nasal spray, 2 sprays each nostril once daily. Continue Singulair (montelukast) each evening Add loratadine 10 mg once daily (generic Claritin)  Obstructive sleep apnea Good compliance with CPAP confirmed today.  Good clinical benefit.  Plan to continue.  GERD (gastroesophageal reflux disease) Continue Protonix as you have been taking it  Olivia Apo, MD, PhD 11/05/2022, 11:03 AM [facility] [phone] or if no answer [phone]

## 2022-11-05 NOTE — Assessment & Plan Note (Signed)
Continue Protonix as you have been taking it

## 2022-11-05 NOTE — Assessment & Plan Note (Signed)
Continue fluticasone nasal spray, 2 sprays each nostril once daily. Continue Singulair (montelukast) each evening Add loratadine 10 mg once daily (generic Claritin)

## 2022-11-05 NOTE — Assessment & Plan Note (Signed)
From a recent exacerbation.  Sounds like these are happening about twice a year, when the seasons change.  Would be reasonable to consider adding an ICS as we go forward.  Continue Bevespi for now.  If she continues this pattern then we will change her to an alternative, possibly Breztri.  She is considering having surgery performed and we will arrange for repeat pulmonary function testing to compare with 2018 to ensure no interval change, risk stratify.  Please continue Bevespi 2 puffs twice a day. Keep your albuterol available to use 2 puffs if needed for shortness of breath, chest tightness, wheezing. We will arrange for pulmonary function testing to compare with your priors Follow-up with APP in 6 months. Follow with Dr. Lamonte Mckee in 12 months or sooner if you have any problems.

## 2022-11-05 NOTE — Patient Instructions (Signed)
Please continue Bevespi 2 puffs twice a day. Keep your albuterol available to use 2 puffs if needed for shortness of breath, chest tightness, wheezing. Continue fluticasone nasal spray, 2 sprays each nostril once daily. Continue Singulair (montelukast) each evening Add loratadine 10 mg once daily (generic Claritin) Continue Protonix as you have been taking it We will arrange for pulmonary function testing to compare with your priors Continue your CPAP every night.  Congratulations on good compliance Follow-up with APP in 6 months. Follow with Dr. Lamonte Sakai in 12 months or sooner if you have any problems.

## 2022-11-05 NOTE — Assessment & Plan Note (Signed)
Good compliance with CPAP confirmed today.  Good clinical benefit.  Plan to continue.

## 2022-11-09 ENCOUNTER — Other Ambulatory Visit: Payer: Self-pay | Admitting: Family Medicine

## 2022-11-09 NOTE — Telephone Encounter (Signed)
Last ov 11/05/22 Last filled 10/13/22 # 30 Next ov 11/12/22

## 2022-11-12 ENCOUNTER — Encounter: Payer: Commercial Managed Care - PPO | Admitting: Family Medicine

## 2022-11-19 NOTE — Patient Instructions (Addendum)
Low Back Sprain or Strain Rehab Ask your health care provider which exercises are safe for you. Do exercises exactly as told by your health care provider and adjust them as directed. It is normal to feel mild stretching, pulling, tightness, or discomfort as you do these exercises. Stop right away if you feel sudden pain or your pain gets worse. Do not begin these exercises until told by your health care provider. Stretching and range-of-motion exercises These exercises warm up your muscles and joints and improve the movement and flexibility of your back. These exercises also help to relieve pain, numbness, and tingling. Lumbar rotation  Lie on your back on a firm bed or the floor with your knees bent. Straighten your arms out to your sides so each arm forms a 90-degree angle (right angle) with a side of your body. Slowly move (rotate) both of your knees to one side of your body until you feel a stretch in your lower back (lumbar). Try not to let your shoulders lift off the floor. Hold this position for __________ seconds. Tense your abdominal muscles and slowly move your knees back to the starting position. Repeat this exercise on the other side of your body. Repeat __________ times. Complete this exercise __________ times a day. Single knee to chest  Lie on your back on a firm bed or the floor with both legs straight. Bend one of your knees. Use your hands to move your knee up toward your chest until you feel a gentle stretch in your lower back and buttock. Hold your leg in this position by holding on to the front of your knee. Keep your other leg as straight as possible. Hold this position for __________ seconds. Slowly return to the starting position. Repeat with your other leg. Repeat __________ times. Complete this exercise __________ times a day. Prone extension on elbows  Lie on your abdomen on a firm bed or the floor (prone position). Prop yourself up on your elbows. Use your arms  to help lift your chest up until you feel a gentle stretch in your abdomen and your lower back. This will place some of your body weight on your elbows. If this is uncomfortable, try stacking pillows under your chest. Your hips should stay down, against the surface that you are lying on. Keep your hip and back muscles relaxed. Hold this position for __________ seconds. Slowly relax your upper body and return to the starting position. Repeat __________ times. Complete this exercise __________ times a day. Strengthening exercises These exercises build strength and endurance in your back. Endurance is the ability to use your muscles for a long time, even after they get tired. Pelvic tilt This exercise strengthens the muscles that lie deep in the abdomen. Lie on your back on a firm bed or the floor with your legs extended. Bend your knees so they are pointing toward the ceiling and your feet are flat on the floor. Tighten your lower abdominal muscles to press your lower back against the floor. This motion will tilt your pelvis so your tailbone points up toward the ceiling instead of pointing to your feet or the floor. To help with this exercise, you may place a small towel under your lower back and try to push your back into the towel. Hold this position for __________ seconds. Let your muscles relax completely before you repeat this exercise. Repeat __________ times. Complete this exercise __________ times a day. Alternating arm and leg raises  Get on your hands   and knees on a firm surface. If you are on a hard floor, you may want to use padding, such as an exercise mat, to cushion your knees. Line up your arms and legs. Your hands should be directly below your shoulders, and your knees should be directly below your hips. Lift your left leg behind you. At the same time, raise your right arm and straighten it in front of you. Do not lift your leg higher than your hip. Do not lift your arm higher  than your shoulder. Keep your abdominal and back muscles tight. Keep your hips facing the ground. Do not arch your back. Keep your balance carefully, and do not hold your breath. Hold this position for __________ seconds. Slowly return to the starting position. Repeat with your right leg and your left arm. Repeat __________ times. Complete this exercise __________ times a day. Abdominal set with straight leg raise  Lie on your back on a firm bed or the floor. Bend one of your knees and keep your other leg straight. Tense your abdominal muscles and lift your straight leg up, 4-6 inches (10-15 cm) off the ground. Keep your abdominal muscles tight and hold this position for __________ seconds. Do not hold your breath. Do not arch your back. Keep it flat against the ground. Keep your abdominal muscles tense as you slowly lower your leg back to the starting position. Repeat with your other leg. Repeat __________ times. Complete this exercise __________ times a day. Single leg lower with bent knees Lie on your back on a firm bed or the floor. Tense your abdominal muscles and lift your feet off the floor, one foot at a time, so your knees and hips are bent in 90-degree angles (right angles). Your knees should be over your hips and your lower legs should be parallel to the floor. Keeping your abdominal muscles tense and your knee bent, slowly lower one of your legs so your toe touches the ground. Lift your leg back up to return to the starting position. Do not hold your breath. Do not let your back arch. Keep your back flat against the ground. Repeat with your other leg. Repeat __________ times. Complete this exercise __________ times a day. Posture and body mechanics Good posture and healthy body mechanics can help to relieve stress in your body's tissues and joints. Body mechanics refers to the movements and positions of your body while you do your daily activities. Posture is part of body  mechanics. Good posture means: Your spine is in its natural S-curve position (neutral). Your shoulders are pulled back slightly. Your head is not tipped forward (neutral). Follow these guidelines to improve your posture and body mechanics in your everyday activities. Standing  When standing, keep your spine neutral and your feet about hip-width apart. Keep a slight bend in your knees. Your ears, shoulders, and hips should line up. When you do a task in which you stand in one place for a long time, place one foot up on a stable object that is 2-4 inches (5-10 cm) high, such as a footstool. This helps keep your spine neutral. Sitting  When sitting, keep your spine neutral and keep your feet flat on the floor. Use a footrest, if necessary, and keep your thighs parallel to the floor. Avoid rounding your shoulders, and avoid tilting your head forward. When working at a desk or a computer, keep your desk at a height where your hands are slightly lower than your elbows. Slide your   chair under your desk so you are close enough to maintain good posture. When working at a computer, place your monitor at a height where you are looking straight ahead and you do not have to tilt your head forward or downward to look at the screen. Resting When lying down and resting, avoid positions that are most painful for you. If you have pain with activities such as sitting, bending, stooping, or squatting, lie in a position in which your body does not bend very much. For example, avoid curling up on your side with your arms and knees near your chest (fetal position). If you have pain with activities such as standing for a long time or reaching with your arms, lie with your spine in a neutral position and bend your knees slightly. Try the following positions: Lying on your side with a pillow between your knees. Lying on your back with a pillow under your knees. Lifting  When lifting objects, keep your feet at least  shoulder-width apart and tighten your abdominal muscles. Bend your knees and hips and keep your spine neutral. It is important to lift using the strength of your legs, not your back. Do not lock your knees straight out. Always ask for help to lift heavy or awkward objects. This information is not intended to replace advice given to you by your health care provider. Make sure you discuss any questions you have with your health care provider. Document Revised: 01/27/2021 Document Reviewed: 01/27/2021 Elsevier Patient Education  [place].

## 2022-11-20 ENCOUNTER — Encounter: Payer: Self-pay | Admitting: Family Medicine

## 2022-11-20 ENCOUNTER — Ambulatory Visit (INDEPENDENT_AMBULATORY_CARE_PROVIDER_SITE_OTHER): Payer: Commercial Managed Care - PPO | Admitting: Family Medicine

## 2022-11-20 VITALS — BP 99/64 | HR 83 | Temp 97.8°F | Ht 64.0 in | Wt 228.6 lb

## 2022-11-20 DIAGNOSIS — F5101 Primary insomnia: Secondary | ICD-10-CM | POA: Diagnosis not present

## 2022-11-20 DIAGNOSIS — Z79899 Other long term (current) drug therapy: Secondary | ICD-10-CM

## 2022-11-20 DIAGNOSIS — F411 Generalized anxiety disorder: Secondary | ICD-10-CM | POA: Diagnosis not present

## 2022-11-20 DIAGNOSIS — F3342 Major depressive disorder, recurrent, in full remission: Secondary | ICD-10-CM | POA: Diagnosis not present

## 2022-11-20 DIAGNOSIS — M1990 Unspecified osteoarthritis, unspecified site: Secondary | ICD-10-CM

## 2022-11-20 MED ORDER — PRAVASTATIN SODIUM 10 MG PO TABS
10.0000 mg | ORAL_TABLET | Freq: Every day | ORAL | 1 refills | Status: DC
Start: 2022-11-20 — End: 2023-05-20

## 2022-11-20 MED ORDER — TRAMADOL HCL 50 MG PO TABS: 50.0000 mg | ORAL_TABLET | Freq: Four times a day (QID) | ORAL | 5 refills | Status: DC | PRN

## 2022-11-20 MED ORDER — PANTOPRAZOLE SODIUM 40 MG PO TBEC: 40.0000 mg | DELAYED_RELEASE_TABLET | Freq: Two times a day (BID) | ORAL | 1 refills | Status: DC

## 2022-11-20 NOTE — Progress Notes (Signed)
OFFICE VISIT  11/20/2022  CC:  Chief Complaint  Patient presents with   Medical Management of Chronic Issues    Patient is a 61 y.o. female who presents for 57-monthfollow-up depression and anxiety and insomnia as well as chronic myofascial pain.  INTERIM HX: Mood and anxiety levels stable. She still has intermittent widespread musculoskeletal pain.  Her primary areas are of the low back diffusely, over the shoulders as well as the hands.  The hands have responded well to methotrexate.  She is followed by rheumatology. She takes tramadol a few times a week, 2 tabs at a time--this is slowly for her shoulder pain.  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  She does have myofascial pain as well.  She sleeps fairly well with use of Ambien. PMP AWARE reviewed today: most recent rx for Ambien was filled 10/28/2022, # 30, rx by me.   Most recent alprazolam prescription filled 10/28/2022, #90, prescription by me. Most recent tramadol prescription filled on 10/27/2022, #90, prescription by me. No red flags.  ROS as above, plus--> no fevers, no CP, no SOB, no wheezing, no cough, no dizziness, no HAs, no rashes, no melena/hematochezia.  No polyuria or polydipsia.  No myalgias or arthralgias.  No focal weakness, paresthesias, or tremors.  No acute vision or hearing abnormalities.  No dysuria or unusual/new urinary urgency or frequency.  No recent changes in lower legs. No n/v/d or abd pain.  No palpitations.     Past Medical History:  Diagnosis Date   Alcoholism ([facility]    Allergic rhinitis    Anxiety and depression    Arthritis of knee    bilat, CPPD+ OA   Asthma    Cervical cancer ([facility]    Choledocholithiasis    COPD (chronic obstructive pulmonary disease) ([facility]    Diverticulosis 2015   Sigmoid colon (noted on colonoscopy)   Elevated LFTs    GERD (gastroesophageal reflux disease)    History of adenomatous polyp of colon 04/02/2014   Recall 5 yrs   History of pyelonephritis  12/2019   Hypercholesterolemia    Inflammatory arthritis    +mild elev ESR, Rheum eval 07/2020: rapid resp to prednisone, felt to have inflamm arth + OA and CCPD bilat knees. Methotrexate INJ q week.   Insomnia    Iron deficiency anemia 04/2021   started iron, hemoccults x 3 NEG   Lumbar spondylosis    Malignant tumor of cervix ([facility] 07/08/2021   Hx of @ age 61 HYST Removal Reason: Cx Cancer   Myalgia, unspecified site    + arthralgias---she is tender EVERYWHERE (summer 2021)->rheum ref   OSA (obstructive sleep apnea) 08/2021   2022 sleep study (Dr. DBrett Fairy severe, with oxygen nadir of 73%. CPAP.   Osteoarthritis, knee    Osteoporosis    Perforation of cecum    perf'd in context of SBO that followed GB surgery; no colon removed.   Ventral hernia     Past Surgical History:  Procedure Laterality Date   BREAST BIOPSY  2010   benign cyst   CHOLECYSTECTOMY     COLONOSCOPY  04/02/2014   2015 adenoma x 2->recall 5 yrs ([facility])   Iliostomy     +  takedown   MOUTH SURGERY     NOSE SURGERY     PARTIAL HYSTERECTOMY  1983   perfor colon repair     SBO after gallbladder surgery   POLYSOMNOGRAPHY  08/2021   Sleep study showed severe OSA, CPAP  recommended.   SALPINGOOPHORECTOMY Bilateral 05/10/2014   TONSILLECTOMY     VENTRAL HERNIA REPAIR  2016   open, with mesh    Outpatient Medications Prior to Visit  Medication Sig Dispense Refill   ALPRAZolam (XANAX) 0.5 MG tablet TAKE 1 TABLET BY MOUTH 3 TIMES DAILY AS NEEDED. 90 tablet 5   B-D TB SYRINGE 1CC/27GX1/2" 27G X 1/2" 1 ML MISC Inject into the skin.     cyclobenzaprine (FLEXERIL) 10 MG tablet TAKE ONE TABLET BY MOUTH 2 TIMES A DAY AS NEEDED FOR MUSCLE SPASMS. 60 tablet 1   FERROUS SULFATE PO Take 325 mg by mouth daily.     FLUoxetine (PROZAC) 40 MG capsule TAKE 1 CAPSULE (40 MG TOTAL) BY MOUTH DAILY. 90 capsule 1   fluticasone (FLONASE) 50 MCG/ACT nasal spray Place 1 spray into both nostrils daily. 16 g 2    Glycopyrrolate-Formoterol (BEVESPI AEROSPHERE) 9-4.8 MCG/ACT AERO Take 2 puffs by mouth in the morning and at bedtime. 10.7 g 11   leucovorin (WELLCOVORIN) 5 MG tablet Take 5 mg by mouth daily.     meloxicam (MOBIC) 15 MG tablet Take 15 mg by mouth daily.     methotrexate 250 MG/10ML injection 0.52m     montelukast (SINGULAIR) 10 MG tablet TAKE 1 TABLET BY MOUTH EVERYDAY AT BEDTIME 90 tablet 3   valACYclovir (VALTREX) 500 MG tablet TAKE 1 TABLET BY MOUTH THEN TAKE 1 TABLET 12 HOURS LATER 180 tablet 0   zolpidem (AMBIEN) 10 MG tablet TAKE 1 TABLET BY MOUTH AT BEDTIME AS NEEDED. 30 tablet 5   traMADol (ULTRAM) 50 MG tablet Take 1 tablet (50 mg total) by mouth 4 (four) times daily as needed. 90 tablet 5   albuterol (PROVENTIL) (2.5 MG/3ML) 0.083% nebulizer solution Take 3 mLs (2.5 mg total) by nebulization every 6 (six) hours as needed for wheezing or shortness of breath. (Patient not taking: Reported on 11/20/2022) 75 mL 12   benzonatate (TESSALON) 200 MG capsule Take 1 capsule (200 mg total) by mouth 3 (three) times daily as needed for cough. (Patient not taking: Reported on 11/20/2022) 30 capsule 1   fluconazole (DIFLUCAN) 150 MG tablet TAKE 1 TAB X 1 DOSE, THEN REPEAT IN 1 WEEK (Patient not taking: Reported on 11/20/2022) 2 tablet 0   albuterol (VENTOLIN HFA) 108 (90 Base) MCG/ACT inhaler Inhale 2 puffs into the lungs every 6 (six) hours as needed for wheezing or shortness of breath. 8 g 6   doxycycline (VIBRA-TABS) 100 MG tablet Take 1 tablet (100 mg total) by mouth 2 (two) times daily. 14 tablet 0   pantoprazole (PROTONIX) 40 MG tablet Take 1 tablet (40 mg total) by mouth 2 (two) times daily. 180 tablet 1   pravastatin (PRAVACHOL) 10 MG tablet TAKE 1 TABLET BY MOUTH EVERY DAY 30 tablet 0   predniSONE (DELTASONE) 10 MG tablet 4 tabs for 2 days, then 3 tabs for 2 days, 2 tabs for 2 days, then 1 tab for 2 days, then stop 20 tablet 0   No facility-administered medications prior to visit.     Allergies  Allergen Reactions   Penicillins Anaphylaxis and Rash   Sulfa Antibiotics Anaphylaxis and Rash    Review of Systems As per HPI  PE:    11/20/2022    1:37 PM 11/05/2022   10:10 AM 10/08/2022    1:06 PM  Vitals with BMI  Height _0  _1  _2   Weight 228 lbs 10 oz 227 lbs 6 oz 222 lbs  10 oz  BMI 39.22 66.06 00.45  Systolic 99 997 741  Diastolic 64 74 72  Pulse 83 89 85     Physical Exam  Gen: Alert, well appearing.  Patient is oriented to person, place, time, and situation. AFFECT: pleasant, lucid thought and speech. No further exam today.  LABS:  Last CBC Lab Results  Component Value Date   WBC 5.1 04/30/2022   HGB 13.8 04/30/2022   HCT 41.5 04/30/2022   MCV 99.1 04/30/2022   RDW 16.2 (H) 04/30/2022   PLT 321.0 04/30/2022   Lab Results  Component Value Date   IRON 92 10/30/2021   TIBC 402 10/30/2021   FERRITIN 45 42/39/5320   Last metabolic panel Lab Results  Component Value Date   GLUCOSE 85 04/30/2022   NA 140 04/30/2022   K 4.4 04/30/2022   CL 102 04/30/2022   CO2 29 04/30/2022   BUN 13 04/30/2022   CREATININE 0.82 04/30/2022   CALCIUM 9.9 04/30/2022   PROT 6.8 04/30/2022   ALBUMIN 4.3 04/30/2022   BILITOT 0.3 04/30/2022   ALKPHOS 84 04/30/2022   AST 18 04/30/2022   ALT 26 04/30/2022   Last lipids Lab Results  Component Value Date   CHOL 186 04/30/2022   HDL 68.70 04/30/2022   LDLCALC 90 04/30/2022   LDLDIRECT 100.0 04/25/2021   TRIG 138.0 04/30/2022   CHOLHDL 3 04/30/2022   Last thyroid functions Lab Results  Component Value Date   TSH 2.05 04/25/2021   IMPRESSION AND PLAN:  #1 recurrent major depressive disorder, in remission.  Continue fluoxetine 40 mg a day.  #2 generalized anxiety disorder with insomnia. Continue fluoxetine.  Continue alprazolam 0.5 3 times daily.  Continue Ambien 10 mg nightly. Urine drug screen today.  #3 chronic widespread musculoskeletal pain. She has a combination of inflammatory  arthritis, osteoarthritis, CPPD, and myofascial pain disorder. She is doing fairly well chronically on methotrexate per rheumatology as well as meloxicam 15 mg a day and as needed tramadol. Urine drug screen today.  An After Visit Summary was printed and given to the patient.  FOLLOW UP: Return in about 6 months (around 05/22/2023) for annual CPE (fasting). Next CPE due 04/2023  Signed:  Crissie Sickles, MD           11/20/2022

## 2022-11-21 LAB — CBC WITH DIFFERENTIAL/PLATELET
Absolute Monocytes: 544 cells/uL (ref 200–950)
Basophils Absolute: 48 cells/uL (ref 0–200)
Basophils Relative: 0.7 %
Eosinophils Absolute: 129 cells/uL (ref 15–500)
Eosinophils Relative: 1.9 %
HCT: 40.2 % (ref 35.0–45.0)
Hemoglobin: 14.1 g/dL (ref 11.7–15.5)
Lymphs Abs: 1958 cells/uL (ref 850–3900)
MCH: 32.9 pg (ref 27.0–33.0)
MCHC: 35.1 g/dL (ref 32.0–36.0)
MCV: 93.9 fL (ref 80.0–100.0)
MPV: 10.1 fL (ref 7.5–12.5)
Monocytes Relative: 8 %
Neutro Abs: 4121 cells/uL (ref 1500–7800)
Neutrophils Relative %: 60.6 %
Platelets: 351 10*3/uL (ref 140–400)
RBC: 4.28 10*6/uL (ref 3.80–5.10)
RDW: 14.3 % (ref 11.0–15.0)
Total Lymphocyte: 28.8 %
WBC: 6.8 10*3/uL (ref 3.8–10.8)

## 2022-11-21 LAB — COMPREHENSIVE METABOLIC PANEL
AG Ratio: 1.7 (calc) (ref 1.0–2.5)
ALT: 46 U/L — ABNORMAL HIGH (ref 6–29)
AST: 27 U/L (ref 10–35)
Albumin: 4.2 g/dL (ref 3.6–5.1)
Alkaline phosphatase (APISO): 83 U/L (ref 37–153)
BUN: 13 mg/dL (ref 7–25)
CO2: 20 mmol/L (ref 20–32)
Calcium: 9.7 mg/dL (ref 8.6–10.4)
Chloride: 107 mmol/L (ref 98–110)
Creat: 0.81 mg/dL (ref 0.50–1.05)
Globulin: 2.5 g/dL (calc) (ref 1.9–3.7)
Glucose, Bld: 113 mg/dL — ABNORMAL HIGH (ref 65–99)
Potassium: 4.6 mmol/L (ref 3.5–5.3)
Sodium: 140 mmol/L (ref 135–146)
Total Bilirubin: 0.3 mg/dL (ref 0.2–1.2)
Total Protein: 6.7 g/dL (ref 6.1–8.1)

## 2022-11-22 LAB — DRUG MONITORING PANEL 376104, URINE

## 2022-11-22 LAB — DM TEMPLATE

## 2022-11-23 ENCOUNTER — Other Ambulatory Visit: Payer: Self-pay | Admitting: Family Medicine

## 2022-11-24 ENCOUNTER — Encounter: Payer: Self-pay | Admitting: Family Medicine

## 2022-11-24 ENCOUNTER — Telehealth: Payer: Self-pay | Admitting: Family Medicine

## 2022-11-24 NOTE — Telephone Encounter (Signed)
The only diagnosis that these meds are medically indicated for his diabetes. Obesity does not count, unfortunately. She can come in for a hemoglobin A1c at her convenience, diagnosis IFG. If her hemoglobin A1c is 6.5% or greater then this is diagnostic of diabetes and we can prescribe the Ozempic.  Otherwise, there is nothing we can do.

## 2022-11-24 NOTE — Telephone Encounter (Signed)
Please advise on [facility] script.

## 2022-11-24 NOTE — Telephone Encounter (Signed)
Pt called ins and stated that [facility] is ok for weight loss but Ozempic is required for DM.

## 2022-11-24 NOTE — Telephone Encounter (Signed)
Pt called is wanting to have her A1C checked, and would like to know if an order can be done for this,  so she does not need to come in for an additional office visit. Please call the patient to advise next steps.

## 2022-11-25 ENCOUNTER — Encounter: Payer: Self-pay | Admitting: Family Medicine

## 2022-11-25 ENCOUNTER — Other Ambulatory Visit: Payer: Self-pay | Admitting: Family Medicine

## 2022-11-25 ENCOUNTER — Ambulatory Visit (INDEPENDENT_AMBULATORY_CARE_PROVIDER_SITE_OTHER): Payer: Commercial Managed Care - PPO

## 2022-11-25 DIAGNOSIS — Z131 Encounter for screening for diabetes mellitus: Secondary | ICD-10-CM | POA: Diagnosis not present

## 2022-11-25 LAB — POCT GLYCOSYLATED HEMOGLOBIN (HGB A1C)
HbA1c POC (<> result, manual entry): 5.6 % (ref 4.0–5.6)
HbA1c, POC (controlled diabetic range): 5.6 % (ref 0.0–7.0)
HbA1c, POC (prediabetic range): 5.6 % — AB (ref 5.7–6.4)
Hemoglobin A1C: 5.6 % (ref 4.0–5.6)

## 2022-11-25 MED ORDER — WEGOVY 0.25 MG/0.5ML ~~LOC~~ SOAJ: 0.2500 mg | SUBCUTANEOUS | 0 refills | Status: DC

## 2022-11-25 NOTE — Addendum Note (Signed)
Addended by: Tammi Sou on: 11/25/2022 01:14 PM   Modules accepted: Orders

## 2022-11-25 NOTE — Telephone Encounter (Signed)
OK, wegovy rx sent. Needs f/u 1 mo

## 2022-11-25 NOTE — Telephone Encounter (Signed)
Olivia Mckee ([employer] 0.'25MG'$ /0.5ML auto-injectors Status: PA RequestCreated: January 3rd, 2024Sent: January 3rd, 2024

## 2022-11-25 NOTE — Telephone Encounter (Signed)
Spoke with patient regarding results/recommendations.  

## 2022-11-26 ENCOUNTER — Encounter: Payer: Self-pay | Admitting: Family Medicine

## 2022-11-26 NOTE — Telephone Encounter (Signed)
Sorry, but these medications do not have generic versions

## 2022-11-26 NOTE — Telephone Encounter (Signed)
See other encounter.

## 2022-11-26 NOTE — Telephone Encounter (Signed)
Olivia Mckee (Key: [id number])

## 2022-11-30 ENCOUNTER — Encounter: Payer: Self-pay | Admitting: Family Medicine

## 2022-12-02 ENCOUNTER — Encounter: Payer: Self-pay | Admitting: Family Medicine

## 2022-12-08 NOTE — Telephone Encounter (Signed)
Noted  

## 2022-12-12 ENCOUNTER — Other Ambulatory Visit: Payer: Self-pay | Admitting: Family Medicine

## 2022-12-16 ENCOUNTER — Encounter: Payer: Self-pay | Admitting: Family Medicine

## 2022-12-16 ENCOUNTER — Ambulatory Visit (INDEPENDENT_AMBULATORY_CARE_PROVIDER_SITE_OTHER): Payer: Commercial Managed Care - PPO | Admitting: Family Medicine

## 2022-12-16 VITALS — BP 106/65 | HR 68 | Temp 97.6°F | Ht 64.0 in | Wt 224.8 lb

## 2022-12-16 DIAGNOSIS — E66812 Obesity, class 2: Secondary | ICD-10-CM

## 2022-12-16 DIAGNOSIS — Z7689 Persons encountering health services in other specified circumstances: Secondary | ICD-10-CM

## 2022-12-16 DIAGNOSIS — E669 Obesity, unspecified: Secondary | ICD-10-CM

## 2022-12-16 NOTE — Progress Notes (Signed)
OFFICE VISIT  12/16/2022  CC:  Chief Complaint  Patient presents with   Weight Management    Discuss YIRSWN, pt would like letter stating necessity for medication.    Patient is a 62 y.o. female who presents for discussion of weight management/patient interested in [place].  INTERIM HX: Feeling fine. She started Nutrisystem several weeks ago and feels like this is going pretty well. Due to her chronic pain and asthma she is only able to walk around the yard walking her dog as her exercise. However she is trying to increase this every day. We discussed the lack of insurance coverage for GLP1 receptor agonists for the treatment of obesity.  She requests a letter stating her medical need for this medication.    Past Medical History:  Diagnosis Date   Alcoholism ([place])    Allergic rhinitis    Anxiety and depression    Arthritis of knee    bilat, CPPD+ OA   Asthma    Cervical cancer ([place])    Choledocholithiasis    COPD (chronic obstructive pulmonary disease) ([place])    Diverticulosis 2015   Sigmoid colon (noted on colonoscopy)   Elevated LFTs    GERD (gastroesophageal reflux disease)    History of adenomatous polyp of colon 04/02/2014   Recall 5 yrs   History of pyelonephritis 12/2019   Hypercholesterolemia    Inflammatory arthritis    +mild elev ESR, Rheum eval 07/2020: rapid resp to prednisone, felt to have inflamm arth + OA and CCPD bilat knees. Methotrexate INJ q week.   Insomnia    Iron deficiency anemia 04/2021   started iron, hemoccults x 3 NEG   Lumbar spondylosis    Malignant tumor of cervix ([place]) 07/08/2021   Hx of @ age 45. HYST Removal Reason: Cx Cancer   Myalgia, unspecified site    + arthralgias---she is tender EVERYWHERE (summer 2021)->rheum ref   OSA (obstructive sleep apnea) 08/2021   2022 sleep study (Dr. Brett Fairy) severe, with oxygen nadir of 73%. CPAP.   Osteoarthritis, knee    Osteoporosis    Perforation of cecum    perf'd in context of SBO that followed  GB surgery; no colon removed.   Ventral hernia     Past Surgical History:  Procedure Laterality Date   BREAST BIOPSY  2010   benign cyst   CHOLECYSTECTOMY     COLONOSCOPY  04/02/2014   2015 adenoma x 2->recall 5 yrs ([facility])   Iliostomy     +  takedown   MOUTH SURGERY     NOSE SURGERY     PARTIAL HYSTERECTOMY  1983   perfor colon repair     SBO after gallbladder surgery   POLYSOMNOGRAPHY  08/2021   Sleep study showed severe OSA, CPAP recommended.   SALPINGOOPHORECTOMY Bilateral 05/10/2014   TONSILLECTOMY     VENTRAL HERNIA REPAIR  2016   open, with mesh    Outpatient Medications Prior to Visit  Medication Sig Dispense Refill   ALPRAZolam (XANAX) 0.5 MG tablet TAKE 1 TABLET BY MOUTH 3 TIMES DAILY AS NEEDED. 90 tablet 5   B-D TB SYRINGE 1CC/27GX1/2" 27G X 1/2" 1 ML MISC Inject into the skin.     cyclobenzaprine (FLEXERIL) 10 MG tablet TAKE ONE TABLET BY MOUTH 2 TIMES A DAY AS NEEDED FOR MUSCLE SPASMS. 60 tablet 1   FERROUS SULFATE PO Take 325 mg by mouth daily.     FLUoxetine (PROZAC) 40 MG capsule TAKE 1 CAPSULE (40 MG TOTAL) BY  MOUTH DAILY. 90 capsule 1   fluticasone (FLONASE) 50 MCG/ACT nasal spray Place 1 spray into both nostrils daily. 16 g 2   Glycopyrrolate-Formoterol (BEVESPI AEROSPHERE) 9-4.8 MCG/ACT AERO Take 2 puffs by mouth in the morning and at bedtime. 10.7 g 11   leucovorin (WELLCOVORIN) 5 MG tablet Take 5 mg by mouth daily.     meloxicam (MOBIC) 15 MG tablet Take 15 mg by mouth daily.     methotrexate 250 MG/10ML injection 0.60m     montelukast (SINGULAIR) 10 MG tablet TAKE 1 TABLET BY MOUTH EVERYDAY AT BEDTIME 90 tablet 3   pantoprazole (PROTONIX) 40 MG tablet Take 1 tablet (40 mg total) by mouth 2 (two) times daily. 180 tablet 1   pravastatin (PRAVACHOL) 10 MG tablet Take 1 tablet (10 mg total) by mouth daily. 90 tablet 1   traMADol (ULTRAM) 50 MG tablet Take 1 tablet (50 mg total) by mouth 4 (four) times daily as needed. 90 tablet 5   valACYclovir  (VALTREX) 500 MG tablet TAKE 1 TABLET BY MOUTH THEN TAKE 1 TABLET 12 HOURS LATER 180 tablet 1   zolpidem (AMBIEN) 10 MG tablet TAKE 1 TABLET BY MOUTH AT BEDTIME AS NEEDED. 30 tablet 5   albuterol (PROVENTIL) (2.5 MG/3ML) 0.083% nebulizer solution Take 3 mLs (2.5 mg total) by nebulization every 6 (six) hours as needed for wheezing or shortness of breath. (Patient not taking: Reported on 11/20/2022) 75 mL 12   benzonatate (TESSALON) 200 MG capsule Take 1 capsule (200 mg total) by mouth 3 (three) times daily as needed for cough. (Patient not taking: Reported on 11/20/2022) 30 capsule 1   fluconazole (DIFLUCAN) 150 MG tablet TAKE 1 TAB X 1 DOSE, THEN REPEAT IN 1 WEEK (Patient not taking: Reported on 11/20/2022) 2 tablet 0   Semaglutide-Weight Management (WEGOVY) 0.25 MG/0.5ML SOAJ Inject 0.25 mg into the skin once a week. (Patient not taking: Reported on 12/16/2022) 2 mL 0   No facility-administered medications prior to visit.    Allergies  Allergen Reactions   Penicillins Anaphylaxis and Rash   Sulfa Antibiotics Anaphylaxis and Rash    Review of Systems As per HPI  PE:    12/16/2022    8:10 AM 11/20/2022    1:37 PM 11/05/2022   10:10 AM  Vitals with BMI  Height '5\' 4"'$  '5\' 4"'$  '5\' 4"'$   Weight 224 lbs 13 oz 228 lbs 10 oz 227 lbs 6 oz  BMI 38.57 [phone] Systolic [id number] Diastolic 65 64 74  Pulse 68 83 89     Physical Exam  Gen: Alert, well appearing.  Patient is oriented to person, place, time, and situation. AFFECT: pleasant, lucid thought and speech. No further exam today.  LABS:  Last CBC Lab Results  Component Value Date   WBC 6.8 11/20/2022   HGB 14.1 11/20/2022   HCT 40.2 11/20/2022   MCV 93.9 11/20/2022   MCH 32.9 11/20/2022   RDW 14.3 11/20/2022   PLT 351 118/84/1660  Last metabolic panel Lab Results  Component Value Date   GLUCOSE 113 (H) 11/20/2022   NA 140 11/20/2022   K 4.6 11/20/2022   CL 107 11/20/2022   CO2 20 11/20/2022   BUN 13 11/20/2022    CREATININE 0.81 11/20/2022   CALCIUM 9.7 11/20/2022   PROT 6.7 11/20/2022   ALBUMIN 4.3 04/30/2022   BILITOT 0.3 11/20/2022   ALKPHOS 84 04/30/2022   AST 27 11/20/2022   ALT 46 (H) 11/20/2022  Last lipids Lab Results  Component Value Date   CHOL 186 04/30/2022   HDL 68.70 04/30/2022   LDLCALC 90 04/30/2022   LDLDIRECT 100.0 04/25/2021   TRIG 138.0 04/30/2022   CHOLHDL 3 04/30/2022   Last hemoglobin A1c Lab Results  Component Value Date   HGBA1C 5.6 11/25/2022   HGBA1C 5.6 11/25/2022   HGBA1C 5.6 (A) 11/25/2022   HGBA1C 5.6 11/25/2022   Last thyroid functions Lab Results  Component Value Date   TSH 2.05 04/25/2021   IMPRESSION AND PLAN:  Obesity, BMI 38. Continue to maximize efforts at low calorie diet and increase cardiovascular exercise. Letter written today requesting special consideration for coverage of Wegovy as adjunct of treatment for obesity/weight management.  An After Visit Summary was printed and given to the patient.  FOLLOW UP: Return for Keep appointment set for 06/16/2023.  Signed:  Crissie Sickles, MD           12/16/2022

## 2022-12-21 ENCOUNTER — Other Ambulatory Visit: Payer: Self-pay | Admitting: Family Medicine

## 2022-12-21 ENCOUNTER — Other Ambulatory Visit: Payer: Self-pay | Admitting: Pulmonary Disease

## 2022-12-21 DIAGNOSIS — J441 Chronic obstructive pulmonary disease with (acute) exacerbation: Secondary | ICD-10-CM

## 2022-12-21 NOTE — Telephone Encounter (Signed)
Requesting: tramadol Contract: n/a UDS: 10/30/21 Last Visit: 12/16/22 Next Visit: 05/24/23 Last Refill: 11/20/22 (90,5)  Medication refill too early

## 2023-01-10 ENCOUNTER — Telehealth: Payer: Self-pay | Admitting: Pulmonary Disease

## 2023-01-10 NOTE — Telephone Encounter (Signed)
Patient calling because she feels like she may have choked on one of her pills at 9:30 AM.  She had a bout of coughing but nothing came back up. Her voice feels raspy but she is still able to speak in full sentences. She is worried she may come down with a pneumonia.  Her breathing is not much worse. I asked her to call the office in the morning to get an early appointment so that we can check her out and do a chest x-ray if needed.  Triage, please make her an early appointment tomorrow If she gets worse in the interim, will need to go to the emergency room

## 2023-01-11 ENCOUNTER — Encounter: Payer: Self-pay | Admitting: Internal Medicine

## 2023-01-11 ENCOUNTER — Ambulatory Visit (INDEPENDENT_AMBULATORY_CARE_PROVIDER_SITE_OTHER): Payer: Commercial Managed Care - PPO | Admitting: Internal Medicine

## 2023-01-11 ENCOUNTER — Ambulatory Visit (INDEPENDENT_AMBULATORY_CARE_PROVIDER_SITE_OTHER): Payer: Commercial Managed Care - PPO

## 2023-01-11 VITALS — BP 126/70 | HR 75 | Temp 97.5°F | Ht 64.0 in | Wt 227.4 lb

## 2023-01-11 DIAGNOSIS — J45901 Unspecified asthma with (acute) exacerbation: Secondary | ICD-10-CM

## 2023-01-11 MED ORDER — PREDNISONE 10 MG PO TABS: ORAL_TABLET | ORAL | 0 refills | Status: DC

## 2023-01-11 NOTE — Telephone Encounter (Signed)
Thank you :)

## 2023-01-11 NOTE — Telephone Encounter (Signed)
Pt has an appt with Dr. Melvyn Novas today 2/19.

## 2023-01-11 NOTE — Progress Notes (Addendum)
Olivia Mckee, female    DOB: [birth year]   MRN: [id number]   Brief patient profile:  62  quit smoking 2012 of Dr Olivia Mckee with history of AB/COPD 0 criteria, OSA on CPAP. She also has a history of rheumatoid arthritis on methotrexate, chronic cough with GERD and allergic rhinitis   self-referred to pulmonary clinic acutely  01/11/2023 for possible pill aspiration    01/10/23 with flair of severe cough/ wheeze/ hoarseness since.      History of Present Illness  01/11/2023  Pulmonary/ 1st office eval/Olivia Mckee  Chief Complaint  Patient presents with   Acute Visit    Possible inhalation of several tablets while taking meds 01/10/23.  Dyspnea:  able to walk into clinic across parking lot  Cough: harsh barking dry cough while awake  Sleep: cpap did fine SABA use: didn't help, has neb not tried yet   No obvious day to day or daytime pattern/variability or assoc excess/ purulent sputum or mucus plugs or hemoptysis or cp or chest tightness,  or overt sinus or hb symptoms on ppi but not ac and not bid  Sleeping on cpa  fine  without nocturnal  or early am exacerbation  of respiratory  c/o's or need for noct saba. Also denies any obvious fluctuation of symptoms with weather or environmental changes or other aggravating or alleviating factors except as outlined above   No unusual exposure hx or h/o childhood pna/ asthma or knowledge of premature birth.  Current Allergies, Complete Past Medical History, Past Surgical History, Family History, and Social History were reviewed in [employer] record.  ROS  The following are not active complaints unless bolded Hoarseness, sore throat, dysphagia, dental problems, itching, sneezing,  nasal congestion or discharge of excess mucus or purulent secretions, ear ache,   fever, chills, sweats, unintended wt loss or wt gain, classically pleuritic or exertional cp,  orthopnea pnd or arm/hand swelling  or leg swelling, presyncope, palpitations,  abdominal pain, anorexia, nausea, vomiting, diarrhea  or change in bowel habits or change in bladder habits, change in stools or change in urine, dysuria, hematuria,  rash, arthralgias, visual complaints, headache, numbness, weakness or ataxia or problems with walking or coordination,  change in mood or  memory.               Past Medical History:  Diagnosis Date   Alcoholism ([place])    Allergic rhinitis    Anxiety and depression    Arthritis of knee    bilat, CPPD+ OA   Asthma    Cervical cancer ([place])    Choledocholithiasis    COPD (chronic obstructive pulmonary disease) ([place])    Diverticulosis 2015   Sigmoid colon (noted on colonoscopy)   Elevated LFTs    GERD (gastroesophageal reflux disease)    History of adenomatous polyp of colon 04/02/2014   Recall 5 yrs   History of pyelonephritis 12/2019   Hypercholesterolemia    Inflammatory arthritis    +mild elev ESR, Rheum eval 07/2020: rapid resp to prednisone, felt to have inflamm arth + OA and CCPD bilat knees. Methotrexate INJ q week.   Insomnia    Iron deficiency anemia 04/2021   started iron, hemoccults x 3 NEG   Lumbar spondylosis    Malignant tumor of cervix ([place]) 07/08/2021   Hx of @ age 53. HYST Removal Reason: Cx Cancer   Myalgia, unspecified site    + arthralgias---she is tender EVERYWHERE (summer 2021)->rheum ref   OSA (obstructive sleep apnea) 08/2021  2022 sleep study (Dr. Brett Mckee) severe, with oxygen nadir of 73%. CPAP.   Osteoarthritis, knee    Osteoporosis    Perforation of cecum    perf'd in context of SBO that followed GB surgery; no colon removed.   Ventral hernia     Outpatient Medications Prior to Visit  Medication Sig Dispense Refill   albuterol (PROVENTIL) (2.5 MG/3ML) 0.083% nebulizer solution Take 3 mLs (2.5 mg total) by nebulization every 6 (six) hours as needed for wheezing or shortness of breath. 75 mL 12   ALPRAZolam (XANAX) 0.5 MG tablet TAKE 1 TABLET BY MOUTH 3 TIMES DAILY AS NEEDED. 90  tablet 5   B-D TB SYRINGE 1CC/27GX1/2" 27G X 1/2" 1 ML MISC Inject into the skin.     benzonatate (TESSALON) 200 MG capsule Take 1 capsule (200 mg total) by mouth 3 (three) times daily as needed for cough. 30 capsule 1   cyclobenzaprine (FLEXERIL) 10 MG tablet TAKE ONE TABLET BY MOUTH 2 TIMES A DAY AS NEEDED FOR MUSCLE SPASMS. 60 tablet 1   FERROUS SULFATE PO Take 325 mg by mouth daily.     fluconazole (DIFLUCAN) 150 MG tablet TAKE 1 TAB X 1 DOSE, THEN REPEAT IN 1 WEEK 2 tablet 0   FLUoxetine (PROZAC) 40 MG capsule TAKE 1 CAPSULE (40 MG TOTAL) BY MOUTH DAILY. 90 capsule 1   fluticasone (FLONASE) 50 MCG/ACT nasal spray SPRAY 1 SPRAY INTO BOTH NOSTRILS DAILY. 16 mL 2   Glycopyrrolate-Formoterol (BEVESPI AEROSPHERE) 9-4.8 MCG/ACT AERO Take 2 puffs by mouth in the morning and at bedtime. 10.7 g 11   leucovorin (WELLCOVORIN) 5 MG tablet Take 5 mg by mouth daily.     meloxicam (MOBIC) 15 MG tablet Take 15 mg by mouth daily.     methotrexate 250 MG/10ML injection 0.18m     montelukast (SINGULAIR) 10 MG tablet TAKE 1 TABLET BY MOUTH EVERYDAY AT BEDTIME 90 tablet 3   pantoprazole (PROTONIX) 40 MG tablet Take 1 tablet (40 mg total) by mouth 2 (two) times daily. 180 tablet 1   pravastatin (PRAVACHOL) 10 MG tablet Take 1 tablet (10 mg total) by mouth daily. 90 tablet 1   traMADol (ULTRAM) 50 MG tablet TAKE 1 TABLET (50 MG TOTAL) BY MOUTH 4 (FOUR) TIMES DAILY AS NEEDED. 90 tablet 1   valACYclovir (VALTREX) 500 MG tablet TAKE 1 TABLET BY MOUTH THEN TAKE 1 TABLET 12 HOURS LATER 180 tablet 1   zolpidem (AMBIEN) 10 MG tablet TAKE 1 TABLET BY MOUTH AT BEDTIME AS NEEDED. 30 tablet 5   Semaglutide-Weight Management (WEGOVY) 0.25 MG/0.5ML SOAJ Inject 0.25 mg into the skin once a week. (Patient not taking: Reported on 01/11/2023) 2 mL 0   No facility-administered medications prior to visit.     Objective:     BP 126/70 (BP Location: Left Arm, Patient Position: Sitting, Cuff Size: Large)   Pulse 75   Temp (!)  97.5 F (36.4 C) (Oral)   Ht 5' 4"$  (1.626 m)   Wt 227 lb 6.4 oz (103.1 kg)   SpO2 97%   BMI 39.03 kg/m   SpO2: 97 % RA  Amb hoarse mod obese wf nad/ prominent pseudowheeze better with plm    HEENT : wearing mask   NECK :  without  apparent JVD/ palpable Nodes/TM    LUNGS: no acc muscle use,  Nl contour chest which is clear to A and P bilaterally without cough on insp or exp maneuvers   CV:  RRR  no  s3 or murmur or increase in P2, and no edema   ABD:  soft and nontender with nl inspiratory excursion in the supine position. No bruits or organomegaly appreciated   MS:  Nl gait/ ext warm without deformities Or obvious joint restrictions  calf tenderness, cyanosis or clubbing    SKIN: warm and dry without lesions    NEURO:  alert, approp, nl sensorium with  no motor or cerebellar deficits apparent.     CXR PA and Lateral:   01/11/2023 :    I personally reviewed images and impression is as follows:   No def as dz/ acute changes though the L Ht border is a bit hazy, nothing seen in lingula on lateral.        Assessment   No problem-specific Assessment & Plan notes found for this encounter.     Christinia Gully, MD 01/11/2023

## 2023-01-11 NOTE — Patient Instructions (Addendum)
Please remember to go to the  x-ray department  for your tests - we will call you with the results when they are available    Prednisone 10 mg take  4 each am x 2 days,   2 each am x 2 days,  1 each am x 2 days and stop    Cough > mucinex dm 1200 mg every 12 hours as needed with flutter valve as much as you can   Use nebulizer every 4 hours until symptoms resolve   Protonix 40 mg Take 30- 60 min before your first and last meals of the day    If not better by 01/13/23 don't eat breakfast and give [place] a call

## 2023-01-11 NOTE — Assessment & Plan Note (Addendum)
Quit smoking 2012  - Spirometry s obstruction  08/02/18  - Possible pill asp 01/20/23  - 01/11/2023  After extensive coaching inhaler device,  effectiveness =    75% (short ti)  Main finding on exam is upper airway wheezing and hoarsness p choked on pills.  Could be from retained pills or just inflammation related to FB plus cyclical cough pattern triggered by initial cough "just like all my colds"   Rec Use neb instead of hfa for now since less likely to aggravate the upper airway component of her cough  Pred x 6 days for airways inflammation  PPI bid ac  Mucinex dm maxe dose and train on flutter valve  F/u in 48 h for possible fob if not improving.          Each maintenance medication was reviewed in detail including emphasizing most importantly the difference between maintenance and prns and under what circumstances the prns are to be triggered using an action plan format where appropriate.  Total time for H and P, chart review, counseling, reviewing hfa/neb  device(s) and generating customized AVS unique to this office visit / same day charting = 32 min with pt new to me

## 2023-01-12 ENCOUNTER — Encounter: Payer: Self-pay | Admitting: Family Medicine

## 2023-01-12 NOTE — Telephone Encounter (Signed)
Please advise on message below.

## 2023-01-13 ENCOUNTER — Telehealth: Payer: Self-pay | Admitting: Emergency Medicine

## 2023-01-13 NOTE — Telephone Encounter (Signed)
Cxr is basically nl

## 2023-01-13 NOTE — Telephone Encounter (Signed)
Went over chest xray results with patient. She verbalized understanding. Nothing further needed.

## 2023-01-13 NOTE — Telephone Encounter (Signed)
Spoke with patient she called to advise she is feeling much better, wheezing is gone and coughing is almost gone. Patient would also like to know results of chest xray.  Dr. Melvyn Novas when you get a moment please advise

## 2023-01-13 NOTE — Telephone Encounter (Signed)
I don't have any problem with request for new med but it is not as simple as just sending in new rx's.  She needs appt to discuss.  Virtual is okay if she can do it, otherwise in person.

## 2023-01-13 NOTE — Telephone Encounter (Signed)
Patient states that she is feeling better, not wheezing anymore, coughed up a lot of stuff yesterday, but would like to know how long she needs to be using the nebulizer machine. Please call back to advise.

## 2023-01-14 ENCOUNTER — Telehealth (INDEPENDENT_AMBULATORY_CARE_PROVIDER_SITE_OTHER): Payer: Commercial Managed Care - PPO | Admitting: Family Medicine

## 2023-01-14 ENCOUNTER — Encounter: Payer: Self-pay | Admitting: Family Medicine

## 2023-01-14 ENCOUNTER — Other Ambulatory Visit: Payer: Self-pay | Admitting: Internal Medicine

## 2023-01-14 VITALS — BP 127/71 | Wt 220.0 lb

## 2023-01-14 DIAGNOSIS — F331 Major depressive disorder, recurrent, moderate: Secondary | ICD-10-CM | POA: Diagnosis not present

## 2023-01-14 DIAGNOSIS — F411 Generalized anxiety disorder: Secondary | ICD-10-CM | POA: Diagnosis not present

## 2023-01-14 DIAGNOSIS — J45901 Unspecified asthma with (acute) exacerbation: Secondary | ICD-10-CM

## 2023-01-14 MED ORDER — DULOXETINE HCL 30 MG PO CPEP: 30.0000 mg | ORAL_CAPSULE | Freq: Every day | ORAL | 0 refills | Status: DC

## 2023-01-14 NOTE — Telephone Encounter (Signed)
Ok to refill but this is not likely to help - would prefer she be seen as work in 12/26/22 with all meds / inhalers etc in hand to regoruip
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
## 2023-01-14 NOTE — Telephone Encounter (Signed)
Called patient to give results on CXR.  Gave all information.  Patient states she took the Prednisone rx on 01/11/23 OV incorrectly.  She took 4 tablets x 3 days and only has 2 tablets left. = total of 14 tablets  Original order was:  Prednisone 10 mg take 4 each am x 2 days, 2 each am x 2 days, 1 each am x 2 days and stop = 14 tablets  Patient is requesting a refill for the prednisone as she states her sx are not improved and she still has Hoarseness, sore throat, dysphagia and severe cough.  Please advise.

## 2023-01-18 ENCOUNTER — Other Ambulatory Visit: Payer: Self-pay | Admitting: Acute Care

## 2023-01-18 ENCOUNTER — Other Ambulatory Visit: Payer: Self-pay | Admitting: Internal Medicine

## 2023-01-18 ENCOUNTER — Other Ambulatory Visit: Payer: Self-pay | Admitting: Emergency Medicine

## 2023-01-18 ENCOUNTER — Other Ambulatory Visit: Payer: Self-pay | Admitting: Family Medicine

## 2023-01-18 DIAGNOSIS — J441 Chronic obstructive pulmonary disease with (acute) exacerbation: Secondary | ICD-10-CM

## 2023-01-18 DIAGNOSIS — J209 Acute bronchitis, unspecified: Secondary | ICD-10-CM

## 2023-01-18 DIAGNOSIS — J45901 Unspecified asthma with (acute) exacerbation: Secondary | ICD-10-CM

## 2023-01-21 ENCOUNTER — Other Ambulatory Visit: Payer: Self-pay | Admitting: Pulmonary Disease

## 2023-01-21 ENCOUNTER — Other Ambulatory Visit: Payer: Self-pay | Admitting: Family Medicine

## 2023-01-21 DIAGNOSIS — J441 Chronic obstructive pulmonary disease with (acute) exacerbation: Secondary | ICD-10-CM

## 2023-02-01 ENCOUNTER — Telehealth: Payer: Self-pay | Admitting: Family Medicine

## 2023-02-01 NOTE — Telephone Encounter (Signed)
Pt believes she has had a reaction to the DULoxetine (CYMBALTA) 30 MG capsule  medication. She says it has been going on for 5 days now just on her chin area. She would like to know if she can just stop taking it for a few days to see if it clears up. Please give the patient a call. She did not want to schedule due to cost and her insurance not kicking in yet.

## 2023-02-01 NOTE — Telephone Encounter (Signed)
Please review and further advise.

## 2023-02-02 NOTE — Telephone Encounter (Signed)
Pt advised of recommendations and to call to schedule an appointment if rash does not clear up.

## 2023-02-02 NOTE — Telephone Encounter (Signed)
Yes, okay to stop Cymbalta and see if the rash clears up.

## 2023-02-19 ENCOUNTER — Telehealth: Payer: Self-pay

## 2023-02-19 NOTE — Telephone Encounter (Signed)
This refill was already refused on 01/18/23 by Dr Shearon Stalls, Eric Form, NP, and Dr. Melvyn Novas.  Patient was contacted on 01/14/23 to have patient come in for an OV as a work in 12/26/22.  Patient did not want to make an OV at this time.

## 2023-02-19 NOTE — Telephone Encounter (Signed)
Called patient.  Follow up to make sure patient was doing well.  We had denied refills on prednisone and doxycycline 01/18/2023.  Per Dr. Melvyn Novas, we had offered patient a work in appointment on 12/26/2022.  Patient refused appointment. Patient states she is doing very well and does not need a follow up at this time, or prednisone.  Patient requested her normal one year follow up with Dr. Melvyn Novas.  Will put a recall notice in for December 2024, per patient request (due to insurance deductible will be met at end of year). Recall notice already in for Dr. Lamonte Sakai in December 2024.  Patient states nothing further needed at this time.

## 2023-02-22 ENCOUNTER — Other Ambulatory Visit: Payer: Self-pay | Admitting: Emergency Medicine

## 2023-03-18 ENCOUNTER — Encounter: Payer: Self-pay | Admitting: Family Medicine

## 2023-03-18 NOTE — Telephone Encounter (Signed)
This is the first I have seen of this type of thing. I think it is fine if you want to give it a try.

## 2023-03-19 ENCOUNTER — Telehealth: Payer: Self-pay | Admitting: Internal Medicine

## 2023-03-19 NOTE — Telephone Encounter (Signed)
[facility]arm[facility] [employer]. They do not use escribe.   They help PT w/an added benefit for a zero dollar co-pay.  They have fax'd over a few req and are now calling to have Bevespaeroshere refilled 90 days 3 refills if possible.    Fax needed @ [phone]    Please fill RX. TY.

## 2023-03-21 ENCOUNTER — Other Ambulatory Visit: Payer: Self-pay | Admitting: Family Medicine

## 2023-03-22 ENCOUNTER — Other Ambulatory Visit: Payer: Self-pay | Admitting: Emergency Medicine

## 2023-03-22 DIAGNOSIS — J441 Chronic obstructive pulmonary disease with (acute) exacerbation: Secondary | ICD-10-CM

## 2023-03-23 ENCOUNTER — Telehealth: Payer: Self-pay | Admitting: Emergency Medicine

## 2023-03-23 NOTE — Telephone Encounter (Signed)
Patient is calling again regarding the refill for her inhaler.  She would like the nurse to call her so that she can explain what needs to be done in order for her to get the medication.  Please advise and call asap at [phone]

## 2023-03-24 MED ORDER — BEVESPI AEROSPHERE 9-4.8 MCG/ACT IN AERO: 2.0000 | INHALATION_SPRAY | Freq: Two times a day (BID) | RESPIRATORY_TRACT | 3 refills | Status: AC

## 2023-03-24 NOTE — Telephone Encounter (Signed)
Called and spoke with patient. She is requesting to have her [employer] and faxed to [facility]. They will be able to provide her the inhaler for free. Confirmed the fax as [phone]. I advised her that I would take care of this for her. She verbalized understanding.   Nothing further needed at time of call.

## 2023-03-25 ENCOUNTER — Telehealth: Payer: Self-pay | Admitting: Emergency Medicine

## 2023-03-25 NOTE — Telephone Encounter (Signed)
Please see 4/26 signed encounter. They have still not rec'd the fax that Cherina personally handled for the PT.  Pharm calling. Verified the fax #. States PT is getting upset. TY.

## 2023-04-01 NOTE — Telephone Encounter (Signed)
Called patient.  She received a call yesterday from [facility] that the Bevespi rx was received and they are shipping the medication to her at $0 cost.  Patient states nothing further needed at this time.

## 2023-04-09 ENCOUNTER — Encounter: Payer: Self-pay | Admitting: Family Medicine

## 2023-04-14 ENCOUNTER — Telehealth: Payer: Self-pay | Admitting: Family Medicine

## 2023-04-14 DIAGNOSIS — J449 Chronic obstructive pulmonary disease, unspecified: Secondary | ICD-10-CM

## 2023-04-14 DIAGNOSIS — G4730 Sleep apnea, unspecified: Secondary | ICD-10-CM

## 2023-04-14 DIAGNOSIS — G4731 Primary central sleep apnea: Secondary | ICD-10-CM

## 2023-04-14 NOTE — Telephone Encounter (Signed)
Amy- FYI. Please let me know if you would recommend anything further.  Called pt. She has been using full face mask since she started on CPAP. However, it made her face break out and she stopped using CPAP. Friend mentioned alternate mask options. She went and bought her own mask- Airfit p30i/nasal pillows. She tried using this option but it was making her ears pop, felt it was blowing too hard, feels like too much pressure. Only used for and had to take off. She has not used CPAP in over a month now.   She was agreeable to go for mask refit w/ [facility]. She requested [facility] location, not [facility] as this is closer for her. I placed this on order. Sent community message to [facility] that order placed.

## 2023-04-14 NOTE — Telephone Encounter (Addendum)
Reviewed pt chart. Last seen 09/07/22 and next f/u 09/09/23. Unable to pull cpap data. She is a card DL. Current CPAP settings per last OV note: 6-20cm  Called pt to further discuss. LVM asking she call office.

## 2023-04-14 NOTE — Telephone Encounter (Signed)
Pt returned call. Please call back when available. 

## 2023-04-14 NOTE — Telephone Encounter (Signed)
Pt asking for a call to discuss how to go about having the pressure lowered on her sleep device, please call.

## 2023-04-21 ENCOUNTER — Other Ambulatory Visit: Payer: Self-pay | Admitting: Family Medicine

## 2023-04-23 ENCOUNTER — Other Ambulatory Visit: Payer: Self-pay | Admitting: Family Medicine

## 2023-04-23 NOTE — Telephone Encounter (Signed)
Historical provider previously refilled. Please fill, if appropriate.

## 2023-04-26 MED ORDER — MELOXICAM 15 MG PO TABS: 15.0000 mg | ORAL_TABLET | Freq: Every day | ORAL | 1 refills | Status: DC

## 2023-05-13 ENCOUNTER — Telehealth: Payer: Self-pay | Admitting: Family Medicine

## 2023-05-13 NOTE — Telephone Encounter (Signed)
Pt requesting to lower pressure. Blowing so hard making ears pop and blows out of mouth. Have switched gear head  may have caused the problem. Would like a call back.

## 2023-05-13 NOTE — Telephone Encounter (Signed)
Called pt back. Went for mask refit w/ [facility]. Switched from full face mask to nasal pillows. Unable to tolerate, made her ears pop, pressure too much. She has not follow up with [facility] yet after changing masks. She will call them to set up appt for them to look at [facility] sure mask is fitting properly.   Also believes pressure was never reduced from 20cm to 19cm. She will discuss this with them as well.

## 2023-05-19 ENCOUNTER — Other Ambulatory Visit: Payer: Self-pay | Admitting: Family Medicine

## 2023-05-20 ENCOUNTER — Other Ambulatory Visit: Payer: Self-pay

## 2023-05-20 ENCOUNTER — Encounter: Payer: Self-pay | Admitting: Family Medicine

## 2023-05-20 ENCOUNTER — Other Ambulatory Visit: Payer: Self-pay | Admitting: Family Medicine

## 2023-05-20 MED ORDER — CYCLOBENZAPRINE HCL 10 MG PO TABS: ORAL_TABLET | ORAL | 0 refills | Status: DC

## 2023-05-20 MED ORDER — MELOXICAM 15 MG PO TABS: 15.0000 mg | ORAL_TABLET | Freq: Every day | ORAL | 0 refills | Status: DC

## 2023-05-24 ENCOUNTER — Encounter: Payer: Commercial Managed Care - PPO | Admitting: Family Medicine

## 2023-06-02 ENCOUNTER — Encounter: Payer: Self-pay | Admitting: Family Medicine

## 2023-06-02 ENCOUNTER — Ambulatory Visit: Payer: Commercial Managed Care - PPO | Admitting: Family Medicine

## 2023-06-02 VITALS — BP 109/75 | HR 74 | Ht 64.0 in | Wt 203.2 lb

## 2023-06-02 DIAGNOSIS — Z131 Encounter for screening for diabetes mellitus: Secondary | ICD-10-CM

## 2023-06-02 DIAGNOSIS — Z79899 Other long term (current) drug therapy: Secondary | ICD-10-CM

## 2023-06-02 DIAGNOSIS — E7800 Pure hypercholesterolemia, unspecified: Secondary | ICD-10-CM | POA: Diagnosis not present

## 2023-06-02 DIAGNOSIS — M25562 Pain in left knee: Secondary | ICD-10-CM

## 2023-06-02 DIAGNOSIS — G4719 Other hypersomnia: Secondary | ICD-10-CM

## 2023-06-02 DIAGNOSIS — F331 Major depressive disorder, recurrent, moderate: Secondary | ICD-10-CM | POA: Diagnosis not present

## 2023-06-02 DIAGNOSIS — G894 Chronic pain syndrome: Secondary | ICD-10-CM | POA: Diagnosis not present

## 2023-06-02 DIAGNOSIS — K219 Gastro-esophageal reflux disease without esophagitis: Secondary | ICD-10-CM

## 2023-06-02 DIAGNOSIS — F411 Generalized anxiety disorder: Secondary | ICD-10-CM | POA: Diagnosis not present

## 2023-06-02 DIAGNOSIS — Z0000 Encounter for general adult medical examination without abnormal findings: Secondary | ICD-10-CM

## 2023-06-02 DIAGNOSIS — M059 Rheumatoid arthritis with rheumatoid factor, unspecified: Secondary | ICD-10-CM

## 2023-06-02 DIAGNOSIS — G8929 Other chronic pain: Secondary | ICD-10-CM | POA: Diagnosis not present

## 2023-06-02 LAB — COMPREHENSIVE METABOLIC PANEL
ALT: 42 U/L — ABNORMAL HIGH (ref 0–35)
AST: 26 U/L (ref 0–37)
Albumin: 4.6 g/dL (ref 3.5–5.2)
Alkaline Phosphatase: 82 U/L (ref 39–117)
BUN: 14 mg/dL (ref 6–23)
CO2: 25 mEq/L (ref 19–32)
Calcium: 10.3 mg/dL (ref 8.4–10.5)
Chloride: 100 mEq/L (ref 96–112)
Creatinine, Ser: 0.86 mg/dL (ref 0.40–1.20)
GFR: 72.38 mL/min (ref >60.00)
Glucose, Bld: 85 mg/dL (ref 70–99)
Potassium: 4.2 mEq/L (ref 3.5–5.1)
Sodium: 136 mEq/L (ref 135–145)
Total Bilirubin: 0.4 mg/dL (ref 0.2–1.2)
Total Protein: 7 g/dL (ref 6.0–8.3)

## 2023-06-02 LAB — LIPID PANEL
Cholesterol: 158 mg/dL (ref 0–200)
HDL: 58.8 mg/dL (ref >39.00)
LDL Cholesterol: 77 mg/dL (ref 0–99)
NonHDL: 99.49
Total CHOL/HDL Ratio: 3
Triglycerides: 113 mg/dL (ref 0.0–149.0)
VLDL: 22.6 mg/dL (ref 0.0–40.0)

## 2023-06-02 LAB — CBC
HCT: 44.1 % (ref 36.0–46.0)
Hemoglobin: 14.7 g/dL (ref 12.0–15.0)
MCHC: 33.3 g/dL (ref 30.0–36.0)
MCV: 96.4 fl (ref 78.0–100.0)
Platelets: 326 10*3/uL (ref 150.0–400.0)
RBC: 4.58 Mil/uL (ref 3.87–5.11)
RDW: 15.2 % (ref 11.5–15.5)
WBC: 5.2 10*3/uL (ref 4.0–10.5)

## 2023-06-02 MED ORDER — ZOLPIDEM TARTRATE 10 MG PO TABS
ORAL_TABLET | ORAL | 1 refills | Status: DC
Start: 2023-06-02 — End: 2023-11-11

## 2023-06-02 MED ORDER — ALPRAZOLAM 0.5 MG PO TABS: 0.5000 mg | ORAL_TABLET | Freq: Three times a day (TID) | ORAL | 5 refills | Status: DC | PRN

## 2023-06-02 MED ORDER — PANTOPRAZOLE SODIUM 40 MG PO TBEC
40.0000 mg | DELAYED_RELEASE_TABLET | Freq: Two times a day (BID) | ORAL | 1 refills | Status: DC
Start: 2023-06-02 — End: 2023-11-11

## 2023-06-02 MED ORDER — TRAMADOL HCL 50 MG PO TABS
50.0000 mg | ORAL_TABLET | Freq: Four times a day (QID) | ORAL | 1 refills | Status: DC | PRN
Start: 2023-06-02 — End: 2023-11-11

## 2023-06-02 NOTE — Progress Notes (Signed)
Office Note 06/02/2023  CC:  Chief Complaint  Patient presents with   Annual Exam   Patient is a 61 y.o. female who is here for annual health maintenance exam and 95-month follow-up depression and anxiety. A/P as of last visit: "#1 recurrent major depressive disorder, active, moderate.  No psychosis. #2 GAD. Will go ahead and switch fluoxetine over to Cymbalta 30 mg a day--no wean off fluoxetine is necessary. Additionally, will slowly wean her off Xanax--take 0.5 mg tab twice a day and will reevaluate in 1 month. She is going to continue with Counseling. (Of note, past meds on EMR list: buprop (in contrave), buspar. No known allergies/intolerance to psychotropics.)"  INTERIM HX: Doing fairly well, especially with exercise and dietary changes.  She has lost about 20 pounds since I last saw her.  Says cymbalta did not help anx/dep so she stopped it.  She misses her husband b/c he travels so much for his job---LONELY.  Talking to psychologist regularly the last 4 mo and has been helping. Taking xanax tid and ambien nightly. PMP AWARE reviewed today: most recent rx for zolpidem was filled 05/21/2023, # 30, rx by me. Most recent prescription for tramadol was filled 03/25/2023, #90, prescription by me. Most recent prescription for alprazolam was filled 12/21/2022, #90, prescription by me.  No red flags.  Past Medical History:  Diagnosis Date   Alcoholism (HCC)    Allergic rhinitis    Anxiety and depression    Arthritis of knee    bilat, CPPD+ OA   Asthma    Cervical cancer (HCC)    Choledocholithiasis    COPD (chronic obstructive pulmonary disease) (HCC)    Diverticulosis 2015   Sigmoid colon (noted on colonoscopy)   Elevated LFTs    GERD (gastroesophageal reflux disease)    History of adenomatous polyp of colon 04/02/2014   Recall 5 yrs   History of pyelonephritis 12/2019   Hypercholesterolemia    Inflammatory arthritis    +mild elev ESR, Rheum eval 07/2020: rapid resp to  prednisone, felt to have inflamm arth + OA and CCPD bilat knees. Methotrexate INJ q week.   Insomnia    Iron deficiency anemia 04/2021   started iron, hemoccults x 3 NEG   Lumbar spondylosis    Malignant tumor of cervix (HCC) 07/08/2021   Hx of @ age 74. HYST Removal Reason: Cx Cancer   Myalgia, unspecified site    + arthralgias---she is tender EVERYWHERE (summer 2021)->rheum ref   OSA (obstructive sleep apnea) 08/2021   2022 sleep study (Dr. Vickey Huger) severe, with oxygen nadir of 73%. CPAP.   Osteoarthritis, knee    Osteoporosis    Perforation of cecum    perf'd in context of SBO that followed GB surgery; no colon removed.   Ventral hernia     Past Surgical History:  Procedure Laterality Date   BREAST BIOPSY  2010   benign cyst   CHOLECYSTECTOMY     COLONOSCOPY  04/02/2014   2015 adenoma x 2->recall 5 yrs ([facility])   Iliostomy     +  takedown   MOUTH SURGERY     NOSE SURGERY     PARTIAL HYSTERECTOMY  1983   perfor colon repair     SBO after gallbladder surgery   POLYSOMNOGRAPHY  08/2021   Sleep study showed severe OSA, CPAP recommended.   SALPINGOOPHORECTOMY Bilateral 05/10/2014   TONSILLECTOMY     VENTRAL HERNIA REPAIR  2016   open, with mesh    Family  History  Problem Relation Age of Onset   Diabetes Mother    Hypertension Mother    Hearing loss Mother    Depression Mother    Diabetes Father    Hearing loss Father    Early death Father    Stroke Maternal Grandmother    Kidney disease Maternal Grandmother    Diabetes Maternal Grandmother    Heart disease Maternal Grandmother    Arthritis Maternal Grandmother    Stroke Maternal Grandfather    Prostate cancer Maternal Grandfather    Diabetes Maternal Grandfather    Heart disease Maternal Grandfather    Arthritis Maternal Grandfather    Alcohol abuse Maternal Grandfather    Depression Maternal Grandfather    Diabetes Paternal Grandmother    Alcohol abuse Paternal Grandmother    Diabetes Paternal  Grandfather    Alcohol abuse Paternal Grandfather    Heart disease Paternal Grandfather     Social History   Socioeconomic History   Marital status: Married    Spouse name: Jillyn Hidden   Number of children: 1   Years of education: Not on file   Highest education level: GED or equivalent  Occupational History   Not on file  Tobacco Use   Smoking status: Former    Packs/day: 1.00    Years: 25.00    Additional pack years: 0.00    Total pack years: 25.00    Types: Cigarettes    Quit date: 11/23/2010    Years since quitting: 12.5   Smokeless tobacco: Never  Substance and Sexual Activity   Alcohol use: No   Drug use: No   Sexual activity: Not on file  Other Topics Concern   Not on file  Social History Narrative   Married, 1 daughter, several grandkids (2 of whom pt is raising).   Educ: some college   Occup: retired Administrator.   Tob: 20 pack-yr hx, quit approx 2005.   Alc: hx of alcoholism, quit 2018.   No hx of drug abuse.      left handed   Caffeine: 2 12oz can a day   Social Determinants of Health   Financial Resource Strain: Low Risk  (10/26/2021)   Overall Financial Resource Strain (CARDIA)    Difficulty of Paying Living Expenses: Not very hard  Food Insecurity: No Food Insecurity (10/26/2021)   Hunger Vital Sign    Worried About Running Out of Food in the Last Year: Never true    Ran Out of Food in the Last Year: Never true  Transportation Needs: No Transportation Needs (10/26/2021)   PRAPARE - Administrator, Civil Service (Medical): No    Lack of Transportation (Non-Medical): No  Physical Activity: Insufficiently Active (10/26/2021)   Exercise Vital Sign    Days of Exercise per Week: 3 days    Minutes of Exercise per Session: 30 min  Stress: Stress Concern Present (10/26/2021)   [employer] of Occupational Health - Occupational Stress Questionnaire    Feeling of Stress : Very much  Social Connections: Moderately Isolated (10/26/2021)   Social  Connection and Isolation Panel [NHANES]    Frequency of Communication with Friends and Family: More than three times a week    Frequency of Social Gatherings with Friends and Family: Never    Attends Religious Services: Never    Database administrator or Organizations: No    Attends Engineer, structural: Not on file    Marital Status: Married  Catering manager Violence: Not on file  Outpatient Medications Prior to Visit  Medication Sig Dispense Refill   albuterol (PROVENTIL) (2.5 MG/3ML) 0.083% nebulizer solution INHALE 3 ML BY NEBULIZATION EVERY 6 HOURS AS NEEDED FOR WHEEZING OR SHORTNESS OF BREATH 75 mL 12   benzonatate (TESSALON) 200 MG capsule Take 1 capsule (200 mg total) by mouth 3 (three) times daily as needed for cough. 30 capsule 1   cyclobenzaprine (FLEXERIL) 10 MG tablet TAKE ONE TABLET BY MOUTH 2 TIMES A DAY AS NEEDED FOR MUSCLE SPASMS. 28 tablet 0   FERROUS SULFATE PO Take 325 mg by mouth daily.     fluticasone (FLONASE) 50 MCG/ACT nasal spray INSTILL 1 SPRAY INTO BOTH NOSTRILS DAILY 16 mL 2   Glycopyrrolate-Formoterol (BEVESPI AEROSPHERE) 9-4.8 MCG/ACT AERO Take 2 puffs by mouth in the morning and at bedtime. 32.1 each 3   leucovorin (WELLCOVORIN) 5 MG tablet Take 5 mg by mouth daily.     meloxicam (MOBIC) 15 MG tablet Take 1 tablet (15 mg total) by mouth daily. 14 tablet 0   methotrexate 250 MG/10ML injection 0.71ml     montelukast (SINGULAIR) 10 MG tablet TAKE 1 TABLET BY MOUTH EVERYDAY AT BEDTIME 90 tablet 3   pravastatin (PRAVACHOL) 10 MG tablet TAKE 1 TABLET BY MOUTH EVERY DAY 90 tablet 0   B-D TB SYRINGE 1CC/27GX1/2" 27G X 1/2" 1 ML MISC Inject into the skin.     fluconazole (DIFLUCAN) 150 MG tablet TAKE 1 TAB X 1 DOSE, THEN REPEAT IN 1 WEEK (Patient not taking: Reported on 01/14/2023) 2 tablet 0   valACYclovir (VALTREX) 500 MG tablet TAKE 1 TABLET BY MOUTH THEN TAKE 1 TABLET 12 HOURS LATER (Patient not taking: Reported on 01/14/2023) 180 tablet 1   ALPRAZolam  (XANAX) 0.5 MG tablet TAKE 1 TABLET BY MOUTH 3 TIMES DAILY AS NEEDED. 90 tablet 5   DULoxetine (CYMBALTA) 30 MG capsule Take 1 capsule (30 mg total) by mouth daily. 30 capsule 0   pantoprazole (PROTONIX) 40 MG tablet Take 1 tablet (40 mg total) by mouth 2 (two) times daily. 180 tablet 1   traMADol (ULTRAM) 50 MG tablet TAKE 1 TABLET (50 MG TOTAL) BY MOUTH 4 (FOUR) TIMES DAILY AS NEEDED. 90 tablet 1   zolpidem (AMBIEN) 10 MG tablet TAKE 1 TABLET BY MOUTH EVERY DAY AT BEDTIME AS NEEDED 30 tablet 1   No facility-administered medications prior to visit.    Allergies  Allergen Reactions   Penicillins Anaphylaxis and Rash   Sulfa Antibiotics Anaphylaxis and Rash    Review of Systems  Constitutional:  Positive for fatigue. Negative for appetite change, chills and fever.  HENT:  Negative for congestion, dental problem, ear pain and sore throat.   Eyes:  Negative for discharge, redness and visual disturbance.  Respiratory:  Negative for cough, chest tightness, shortness of breath and wheezing.   Cardiovascular:  Negative for chest pain, palpitations and leg swelling.  Gastrointestinal:  Negative for abdominal pain, blood in stool, diarrhea, nausea and vomiting.  Genitourinary:  Negative for difficulty urinating, dysuria, flank pain, frequency, hematuria and urgency.  Musculoskeletal:  Positive for arthralgias, joint swelling (L knee) and myalgias. Negative for back pain and neck stiffness.  Skin:  Negative for pallor and rash.  Neurological:  Negative for dizziness, speech difficulty, weakness and headaches.  Hematological:  Negative for adenopathy. Does not bruise/bleed easily.  Psychiatric/Behavioral:  Positive for dysphoric mood. Negative for confusion and sleep disturbance. The patient is nervous/anxious.     PE;    06/02/2023  9:10 AM 01/14/2023    8:52 AM 01/11/2023   11:36 AM  Vitals with BMI  Height 5\' 4"   5\' 4"   Weight 203 lbs 3 oz 220 lbs 227 lbs 6 oz  BMI 34.86 37.74 39.01   Systolic 109 127 865  Diastolic 75 71 70  Pulse 74  75   Exam chaperoned by Sammuel Cooper, CMA  Gen: Alert, well appearing.  Patient is oriented to person, place, time, and situation. AFFECT: pleasant, lucid thought and speech. ENT: Ears: EACs clear, normal epithelium.  TMs with good light reflex and landmarks bilaterally.  Eyes: no injection, icteris, swelling, or exudate.  EOMI, PERRLA. Nose: no drainage or turbinate edema/swelling.  No injection or focal lesion.  Mouth: lips without lesion/swelling.  Oral mucosa pink and moist.  Dentition intact and without obvious caries or gingival swelling.  Oropharynx without erythema, exudate, or swelling.  Neck: supple/nontender.  No LAD, mass, or TM.  Carotid pulses 2+ bilaterally, without bruits. CV: RRR, no m/r/g.   LUNGS: CTA bilat, nonlabored resps, good aeration in all lung fields. ABD: soft, NT, ND, BS normal.  No hepatospenomegaly or mass.  No bruits. EXT: no clubbing, cyanosis, or edema.  Musculoskeletal: Left knee with medial joint line tenderness as well as some tenderness over the pes anserine region. No erythema or warmth.  Otherwise, no joint swelling, erythema, warmth, or tenderness.  ROM of all joints intact. Skin - no sores or suspicious lesions or rashes or color changes   Pertinent labs:  Lab Results  Component Value Date   TSH 2.05 04/25/2021   Lab Results  Component Value Date   WBC 6.8 11/20/2022   HGB 14.1 11/20/2022   HCT 40.2 11/20/2022   MCV 93.9 11/20/2022   PLT 351 11/20/2022   Lab Results  Component Value Date   CREATININE 0.81 11/20/2022   BUN 13 11/20/2022   NA 140 11/20/2022   K 4.6 11/20/2022   CL 107 11/20/2022   CO2 20 11/20/2022   Lab Results  Component Value Date   ALT 46 (H) 11/20/2022   AST 27 11/20/2022   ALKPHOS 84 04/30/2022   BILITOT 0.3 11/20/2022   Lab Results  Component Value Date   CHOL 186 04/30/2022   Lab Results  Component Value Date   HDL 68.70 04/30/2022   Lab  Results  Component Value Date   LDLCALC 90 04/30/2022   Lab Results  Component Value Date   TRIG 138.0 04/30/2022   Lab Results  Component Value Date   CHOLHDL 3 04/30/2022   Lab Results  Component Value Date   HGBA1C 5.6 11/25/2022   HGBA1C 5.6 11/25/2022   HGBA1C 5.6 (A) 11/25/2022   HGBA1C 5.6 11/25/2022   ASSESSMENT AND PLAN:   #1 health maintenance exam: Reviewed age and gender appropriate health maintenance issues (prudent diet, regular exercise, health risks of tobacco and excessive alcohol, use of seatbelts, fire alarms in home, use of sunscreen).  Also reviewed age and gender appropriate health screening as well as vaccine recommendations. Vaccines: All up-to-date Labs: CBC, lipid panel, c-Met Cervical ca screening: Hx of oopherectomy and total hyst-->[facility]. Breast ca screening: [facility]. Colon ca screening:  Hx of adenomatous polyps, recall was 03/2019 ([facility])->she won't go back to them and no other GI will accept her.  #2 chronic depression and anxiety. Counseling is helping a lot.  She feels lonely and little estranged from her kids--has a 63 year old and a 41 year old. We will keep  her on Xanax 0.5 mg 3 times daily at this point in time.  No antidepressants. Controlled substance contract updated.  Urine drug screen today.  #3 chronic widespread pain syndrome. Myofascial pain, inflammatory arthritis, osteoarthritis, CPPD. Stable on tramadol 50 mg, 1 tab 4 times daily as needed. Controlled substance contract updated.  Urine drug screen today.  4.  Chronic left knee pain. Says it has been severe lately. She requests steroid injection. Of note, we have done pes anserine injection in the past in the same knee and it has not helped a whole lot. June 2023 radiographs of the left knee showed significant medial compartment DJD and patellofemoral compartment as well. I will have her return for intra-articular left knee steroid  injection.  #5 hypercholesterolemia, doing well on pravastatin 10 mg a day. Lipid panel and hepatic panel today.  An After Visit Summary was printed and given to the patient.  FOLLOW UP:  Return in about 6 months (around 12/03/2023) for routine chronic illness f/u.  Signed:  Santiago Bumpers, MD           06/02/2023

## 2023-06-03 NOTE — Telephone Encounter (Signed)
She is on the maximum dose of zolpidem. She already takes alprazolam as well. I recommend against adding any additional sleep medication.

## 2023-06-04 ENCOUNTER — Encounter: Payer: Self-pay | Admitting: Family Medicine

## 2023-06-04 ENCOUNTER — Ambulatory Visit (INDEPENDENT_AMBULATORY_CARE_PROVIDER_SITE_OTHER): Payer: Commercial Managed Care - PPO | Admitting: Family Medicine

## 2023-06-04 VITALS — BP 109/72 | HR 87 | Wt 201.0 lb

## 2023-06-04 DIAGNOSIS — M25562 Pain in left knee: Secondary | ICD-10-CM

## 2023-06-04 DIAGNOSIS — M1712 Unilateral primary osteoarthritis, left knee: Secondary | ICD-10-CM | POA: Diagnosis not present

## 2023-06-04 DIAGNOSIS — G8929 Other chronic pain: Secondary | ICD-10-CM | POA: Diagnosis not present

## 2023-06-04 LAB — DRUG MONITORING PANEL 376104, URINE
Alphahydroxyalprazolam: 243 ng/mL — ABNORMAL HIGH (ref <25)
Alphahydroxymidazolam: NEGATIVE ng/mL (ref <50)
Alphahydroxytriazolam: NEGATIVE ng/mL (ref <50)
Aminoclonazepam: NEGATIVE ng/mL (ref <25)
Amphetamines: NEGATIVE ng/mL (ref <500)
Barbiturates: NEGATIVE ng/mL (ref <300)
Benzodiazepines: POSITIVE ng/mL — AB (ref <100)
Cocaine Metabolite: NEGATIVE ng/mL (ref <150)
Desmethyltramadol: 256 ng/mL — ABNORMAL HIGH (ref <100)
Hydroxyethylflurazepam: NEGATIVE ng/mL (ref <50)
Lorazepam: NEGATIVE ng/mL (ref <50)
Nordiazepam: NEGATIVE ng/mL (ref <50)
Opiates: NEGATIVE ng/mL (ref <100)
Oxazepam: NEGATIVE ng/mL (ref <50)
Oxycodone: NEGATIVE ng/mL (ref <100)
Temazepam: NEGATIVE ng/mL (ref <50)
Tramadol: 505 ng/mL — ABNORMAL HIGH (ref <100)

## 2023-06-04 LAB — DM TEMPLATE

## 2023-06-04 MED ORDER — TRIAMCINOLONE ACETONIDE 40 MG/ML IJ SUSP
40.0000 mg | Freq: Once | INTRAMUSCULAR | Status: AC
Start: 2023-06-04 — End: 2023-06-23
  Administered 2023-06-23: 40 mg via INTRA_ARTICULAR

## 2023-06-04 NOTE — Progress Notes (Signed)
OFFICE VISIT  06/04/2023  CC:  Chief Complaint  Patient presents with   Knee Pain    Knee injection.     Patient is a 62 y.o. female who presents for chronic left knee pain.  HPI: Acute worsening of chronic left knee pain.   I saw her for this problem 2 days ago.  She wanted to return today for steroid injection. Symptoms have not changed.  History of degenerative changes in the medial and patellofemoral compartments, mild to loss of joint space in the medial compartment -->noted on radiograph 05/14/2022.  Past Medical History:  Diagnosis Date   Alcoholism (HCC)    Allergic rhinitis    Anxiety and depression    Arthritis of knee    bilat, CPPD+ OA   Asthma    Cervical cancer (HCC)    Choledocholithiasis    COPD (chronic obstructive pulmonary disease) (HCC)    Diverticulosis 2015   Sigmoid colon (noted on colonoscopy)   Elevated LFTs    GERD (gastroesophageal reflux disease)    History of adenomatous polyp of colon 04/02/2014   Recall 5 yrs   History of pyelonephritis 12/2019   Hypercholesterolemia    Inflammatory arthritis    +mild elev ESR, Rheum eval 07/2020: rapid resp to prednisone, felt to have inflamm arth + OA and CCPD bilat knees. Methotrexate INJ q week.   Insomnia    Iron deficiency anemia 04/2021   started iron, hemoccults x 3 NEG   Lumbar spondylosis    Malignant tumor of cervix (HCC) 07/08/2021   Hx of @ age 54. HYST Removal Reason: Cx Cancer   Myalgia, unspecified site    + arthralgias---she is tender EVERYWHERE (summer 2021)->rheum ref   OSA (obstructive sleep apnea) 08/2021   2022 sleep study (Dr. Vickey Huger) severe, with oxygen nadir of 73%. CPAP.   Osteoarthritis, knee    Osteoporosis    Perforation of cecum    perf'd in context of SBO that followed GB surgery; no colon removed.   Ventral hernia     Past Surgical History:  Procedure Laterality Date   BREAST BIOPSY  2010   benign cyst   CHOLECYSTECTOMY     COLONOSCOPY  04/02/2014   2015  adenoma x 2->recall 5 yrs ([facility])   Iliostomy     +  takedown   MOUTH SURGERY     NOSE SURGERY     PARTIAL HYSTERECTOMY  1983   perfor colon repair     SBO after gallbladder surgery   POLYSOMNOGRAPHY  08/2021   Sleep study showed severe OSA, CPAP recommended.   SALPINGOOPHORECTOMY Bilateral 05/10/2014   TONSILLECTOMY     VENTRAL HERNIA REPAIR  2016   open, with mesh    Outpatient Medications Prior to Visit  Medication Sig Dispense Refill   albuterol (PROVENTIL) (2.5 MG/3ML) 0.083% nebulizer solution INHALE 3 ML BY NEBULIZATION EVERY 6 HOURS AS NEEDED FOR WHEEZING OR SHORTNESS OF BREATH 75 mL 12   ALPRAZolam (XANAX) 0.5 MG tablet Take 1 tablet (0.5 mg total) by mouth 3 (three) times daily as needed. 90 tablet 5   B-D TB SYRINGE 1CC/27GX1/2" 27G X 1/2" 1 ML MISC Inject into the skin.     benzonatate (TESSALON) 200 MG capsule Take 1 capsule (200 mg total) by mouth 3 (three) times daily as needed for cough. 30 capsule 1   cyclobenzaprine (FLEXERIL) 10 MG tablet TAKE ONE TABLET BY MOUTH 2 TIMES A DAY AS NEEDED FOR MUSCLE SPASMS. 28 tablet 0  FERROUS SULFATE PO Take 325 mg by mouth daily.     fluconazole (DIFLUCAN) 150 MG tablet TAKE 1 TAB X 1 DOSE, THEN REPEAT IN 1 WEEK (Patient not taking: Reported on 01/14/2023) 2 tablet 0   fluticasone (FLONASE) 50 MCG/ACT nasal spray INSTILL 1 SPRAY INTO BOTH NOSTRILS DAILY 16 mL 2   Glycopyrrolate-Formoterol (BEVESPI AEROSPHERE) 9-4.8 MCG/ACT AERO Take 2 puffs by mouth in the morning and at bedtime. 32.1 each 3   leucovorin (WELLCOVORIN) 5 MG tablet Take 5 mg by mouth daily.     meloxicam (MOBIC) 15 MG tablet Take 1 tablet (15 mg total) by mouth daily. 14 tablet 0   methotrexate 250 MG/10ML injection 0.25ml     montelukast (SINGULAIR) 10 MG tablet TAKE 1 TABLET BY MOUTH EVERYDAY AT BEDTIME 90 tablet 3   pantoprazole (PROTONIX) 40 MG tablet Take 1 tablet (40 mg total) by mouth 2 (two) times daily. 180 tablet 1   pravastatin (PRAVACHOL) 10 MG  tablet TAKE 1 TABLET BY MOUTH EVERY DAY 90 tablet 0   traMADol (ULTRAM) 50 MG tablet Take 1 tablet (50 mg total) by mouth 4 (four) times daily as needed. 90 tablet 1   valACYclovir (VALTREX) 500 MG tablet TAKE 1 TABLET BY MOUTH THEN TAKE 1 TABLET 12 HOURS LATER (Patient not taking: Reported on 01/14/2023) 180 tablet 1   zolpidem (AMBIEN) 10 MG tablet TAKE 1 TABLET BY MOUTH EVERY DAY AT BEDTIME AS NEEDED 90 tablet 1   No facility-administered medications prior to visit.    Allergies  Allergen Reactions   Penicillins Anaphylaxis and Rash   Sulfa Antibiotics Anaphylaxis and Rash    Review of Systems  As per HPI  PE:    06/02/2023    9:10 AM 01/14/2023    8:52 AM 01/11/2023   11:36 AM  Vitals with BMI  Height 5\' 4"   5\' 4"   Weight 203 lbs 3 oz 220 lbs 227 lbs 6 oz  BMI 34.86 37.74 39.01  Systolic 109 127 161  Diastolic 75 71 70  Pulse 74  75     Physical Exam  Gen: Alert, well appearing.  Patient is oriented to person, place, time, and situation. LEFT KNEE: No erythema or warmth or effusion.  There is some crepitus.  Flexion 80 degrees, extension to 170 degrees. Positive patellar grind.  Mild medial joint line tenderness.  LABS:  Last CBC Lab Results  Component Value Date   WBC 5.2 06/02/2023   HGB 14.7 06/02/2023   HCT 44.1 06/02/2023   MCV 96.4 06/02/2023   MCH 32.9 11/20/2022   RDW 15.2 06/02/2023   PLT 326.0 06/02/2023   Last metabolic panel Lab Results  Component Value Date   GLUCOSE 85 06/02/2023   NA 136 06/02/2023   K 4.2 06/02/2023   CL 100 06/02/2023   CO2 25 06/02/2023   BUN 14 06/02/2023   CREATININE 0.86 06/02/2023   GFR 72.38 06/02/2023   CALCIUM 10.3 06/02/2023   PROT 7.0 06/02/2023   ALBUMIN 4.6 06/02/2023   BILITOT 0.4 06/02/2023   ALKPHOS 82 06/02/2023   AST 26 06/02/2023   ALT 42 (H) 06/02/2023   IMPRESSION AND PLAN:  No problem-specific Assessment & Plan notes found for this encounter.  Acute on chronic L knee pain, +DJD. Pt  desires steroid injection today.  Ultrasound-guided injection is preferred based on studies that show increased duration, increased effect, greater accuracy, decreased procedural pain, increased response rate, and decreased cost with ultrasound-guided versus blind injection. Procedure:  Real-time ultrasound guided injection of left knee suprapatellar pouch. Device: GE [employer] informed consent obtained.  Timeout conducted.  No overlying erythema, induration, or other signs of local infection. After sterile prep with Betadine, injected 3 mL of 1% plain lidocaine for local anesthesia followed by a mixture of 40 mg Kenalog +3 mL 1% plain lidocaine using 25-gauge 1-1/2 inch needle. Injectate seen filling suprapatellar pouch. Patient tolerated the procedure well.  No immediate complications.  Post-injection care discussed. Advised to call if fever/chills, erythema, drainage, or persistent bleeding. Impression: Technically successful ultrasound-guided injection.  An After Visit Summary was printed and given to the patient.  FOLLOW UP: Return if symptoms worsen or fail to improve.  Signed:  Santiago Bumpers, MD           06/04/2023

## 2023-06-14 ENCOUNTER — Encounter: Payer: Self-pay | Admitting: Family Medicine

## 2023-06-19 ENCOUNTER — Encounter: Payer: Self-pay | Admitting: Family Medicine

## 2023-06-19 ENCOUNTER — Other Ambulatory Visit: Payer: Self-pay | Admitting: Family Medicine

## 2023-06-21 ENCOUNTER — Other Ambulatory Visit: Payer: Self-pay | Admitting: Family Medicine

## 2023-06-21 NOTE — Telephone Encounter (Signed)
Noted  

## 2023-06-21 NOTE — Telephone Encounter (Signed)
RF request for cyclobenzaprine (FLEXERIL) 10 MG tablet  LOV: 06/04/23 Next ov: 11/12/23 Last written: 05/20/23 (28,0)   Requesting: zolpidem (AMBIEN) 10 MG tablet  Contract: 07/14/22 UDS: 06/02/23 Last Visit: 06/04/23 Next Visit: 11/12/23 Last Refill: 06/02/23 (90,1)  Please Advise on Flexeril rx. Too early for Ambien RF

## 2023-08-10 ENCOUNTER — Encounter: Payer: Self-pay | Admitting: Family Medicine

## 2023-08-11 ENCOUNTER — Telehealth: Payer: Self-pay | Admitting: Family Medicine

## 2023-08-11 ENCOUNTER — Ambulatory Visit (INDEPENDENT_AMBULATORY_CARE_PROVIDER_SITE_OTHER): Payer: Commercial Managed Care - PPO | Admitting: Family Medicine

## 2023-08-11 VITALS — BP 115/80 | HR 66 | Wt 189.0 lb

## 2023-08-11 DIAGNOSIS — Z86010 Personal history of colonic polyps: Secondary | ICD-10-CM | POA: Diagnosis not present

## 2023-08-11 DIAGNOSIS — H00011 Hordeolum externum right upper eyelid: Secondary | ICD-10-CM | POA: Diagnosis not present

## 2023-08-11 DIAGNOSIS — Z23 Encounter for immunization: Secondary | ICD-10-CM | POA: Diagnosis not present

## 2023-08-11 MED ORDER — ERYTHROMYCIN 5 MG/GM OP OINT: 1.0000 | TOPICAL_OINTMENT | Freq: Three times a day (TID) | OPHTHALMIC | 0 refills | Status: DC

## 2023-08-11 NOTE — Telephone Encounter (Signed)
Pt discussed in office visit

## 2023-08-11 NOTE — Progress Notes (Signed)
OFFICE VISIT  08/11/2023  CC:  Chief Complaint  Patient presents with   Stye    She has a stye on her right eye. Flu shot    Patient is a 62 y.o. female who presents for eyelid concern.  HPI: 4 days of uncomfortable right upper eyelid focal swelling.  No eye pain or vision abnormality.  No active eye drainage.   Past Medical History:  Diagnosis Date   Alcoholism (HCC)    Allergic rhinitis    Anxiety and depression    Arthritis of knee    bilat, CPPD+ OA   Asthma    Cervical cancer (HCC)    Choledocholithiasis    COPD (chronic obstructive pulmonary disease) (HCC)    Diverticulosis 2015   Sigmoid colon (noted on colonoscopy)   Elevated LFTs    GERD (gastroesophageal reflux disease)    History of adenomatous polyp of colon 04/02/2014   Recall 5 yrs   History of pyelonephritis 12/2019   Hypercholesterolemia    Inflammatory arthritis    +mild elev ESR, Rheum eval 07/2020: rapid resp to prednisone, felt to have inflamm arth + OA and CCPD bilat knees. Methotrexate INJ q week.   Insomnia    Iron deficiency anemia 04/2021   started iron, hemoccults x 3 NEG   Lumbar spondylosis    Malignant tumor of cervix (HCC) 07/08/2021   Hx of @ age 2. HYST Removal Reason: Cx Cancer   Myalgia, unspecified site    + arthralgias---she is tender EVERYWHERE (summer 2021)->rheum ref   OSA (obstructive sleep apnea) 08/2021   2022 sleep study (Dr. Vickey Huger) severe, with oxygen nadir of 73%. CPAP.   Osteoarthritis, knee    Osteoporosis    Perforation of cecum    perf'd in context of SBO that followed GB surgery; no colon removed.   Ventral hernia     Past Surgical History:  Procedure Laterality Date   BREAST BIOPSY  2010   benign cyst   CHOLECYSTECTOMY     COLONOSCOPY  04/02/2014   2015 adenoma x 2->recall 5 yrs ([facility])   Iliostomy     +  takedown   MOUTH SURGERY     NOSE SURGERY     PARTIAL HYSTERECTOMY  1983   perfor colon repair     SBO after gallbladder surgery    POLYSOMNOGRAPHY  08/2021   Sleep study showed severe OSA, CPAP recommended.   SALPINGOOPHORECTOMY Bilateral 05/10/2014   TONSILLECTOMY     VENTRAL HERNIA REPAIR  2016   open, with mesh    Outpatient Medications Prior to Visit  Medication Sig Dispense Refill   albuterol (PROVENTIL) (2.5 MG/3ML) 0.083% nebulizer solution INHALE 3 ML BY NEBULIZATION EVERY 6 HOURS AS NEEDED FOR WHEEZING OR SHORTNESS OF BREATH 75 mL 12   ALPRAZolam (XANAX) 0.5 MG tablet Take 1 tablet (0.5 mg total) by mouth 3 (three) times daily as needed. 90 tablet 5   B-D TB SYRINGE 1CC/27GX1/2" 27G X 1/2" 1 ML MISC Inject into the skin.     cyclobenzaprine (FLEXERIL) 10 MG tablet TAKE ONE TABLET BY MOUTH 2 TIMES A DAY AS NEEDED FOR MUSCLE SPASMS. 60 tablet 1   FERROUS SULFATE PO Take 325 mg by mouth daily.     fluticasone (FLONASE) 50 MCG/ACT nasal spray INSTILL 1 SPRAY INTO BOTH NOSTRILS DAILY 16 mL 2   Glycopyrrolate-Formoterol (BEVESPI AEROSPHERE) 9-4.8 MCG/ACT AERO Take 2 puffs by mouth in the morning and at bedtime. 32.1 each 3   leucovorin (WELLCOVORIN)  5 MG tablet Take 5 mg by mouth daily.     meloxicam (MOBIC) 15 MG tablet Take 1 tablet (15 mg total) by mouth daily. 14 tablet 0   methotrexate 250 MG/10ML injection 0.73ml     montelukast (SINGULAIR) 10 MG tablet TAKE 1 TABLET BY MOUTH EVERYDAY AT BEDTIME 90 tablet 3   pantoprazole (PROTONIX) 40 MG tablet Take 1 tablet (40 mg total) by mouth 2 (two) times daily. 180 tablet 1   pravastatin (PRAVACHOL) 10 MG tablet TAKE 1 TABLET BY MOUTH EVERY DAY 90 tablet 0   traMADol (ULTRAM) 50 MG tablet Take 1 tablet (50 mg total) by mouth 4 (four) times daily as needed. 90 tablet 1   zolpidem (AMBIEN) 10 MG tablet TAKE 1 TABLET BY MOUTH EVERY DAY AT BEDTIME AS NEEDED 90 tablet 1   benzonatate (TESSALON) 200 MG capsule Take 1 capsule (200 mg total) by mouth 3 (three) times daily as needed for cough. (Patient not taking: Reported on 08/11/2023) 30 capsule 1   fluconazole (DIFLUCAN) 150  MG tablet TAKE 1 TAB X 1 DOSE, THEN REPEAT IN 1 WEEK (Patient not taking: Reported on 01/14/2023) 2 tablet 0   valACYclovir (VALTREX) 500 MG tablet TAKE 1 TABLET BY MOUTH THEN TAKE 1 TABLET 12 HOURS LATER (Patient not taking: Reported on 01/14/2023) 180 tablet 1   No facility-administered medications prior to visit.    Allergies  Allergen Reactions   Penicillins Anaphylaxis and Rash   Sulfa Antibiotics Anaphylaxis and Rash    Review of Systems  As per HPI  PE:    08/11/2023   10:35 AM 06/04/2023    5:03 PM 06/02/2023    9:10 AM  Vitals with BMI  Height   5\' 4"   Weight 189 lbs 201 lbs 203 lbs 3 oz  BMI  34.48 34.86  Systolic 115 109 102  Diastolic 80 72 75  Pulse 66 87 74     Physical Exam  General: Alert and well-appearing. Right upper eyelid with focal erythema and swelling just behind the lash line on the inside of the lid.  Mild palpebral conjunctival erythema.  No bulbar conjunctival injection. No eye drainage.  Extraocular movements intact.  Pupils equal reactive to light.  LABS:  Last metabolic panel Lab Results  Component Value Date   GLUCOSE 85 06/02/2023   NA 136 06/02/2023   K 4.2 06/02/2023   CL 100 06/02/2023   CO2 25 06/02/2023   BUN 14 06/02/2023   CREATININE 0.86 06/02/2023   GFR 72.38 06/02/2023   CALCIUM 10.3 06/02/2023   PROT 7.0 06/02/2023   ALBUMIN 4.6 06/02/2023   BILITOT 0.4 06/02/2023   ALKPHOS 82 06/02/2023   AST 26 06/02/2023   ALT 42 (H) 06/02/2023   IMPRESSION AND PLAN:  #1 right upper eyelid hordeolum. Erythromycin ointment 3 times daily x 5 days prescribed. Warm compresses with baby shampoo soak 15 to 20 minutes twice a day.  #2 colon cancer screening: [facility] requests a referral to Dr. Loreta Ave in GI. She had a screening colonoscopy with digestive health specialist on 04/02/2014.  This showed an adenomatous polyp and it was recommended she get a repeat colonoscopy in 5 years. However she has had a complicated GI history after that  time and has not been able to get her repeat colonoscopy. Referral ordered today.  (Records in EMR reviewed, general surgery and GI. Pertinent details are: #1 she got laparoscopic cholecystectomy and ERCP/sphincterotomy and CBD stent at the time of acute cholecystitis on  09/03/2014.   #2 On 09/10/14 she was admitted for abdominal pain and bloating and found to have free air on abdominal imaging.  She underwent exploratory laparotomy at which time a cecal perforation was detected and she underwent a right hemicolectomy with placement of ileostomy with mucous fistula. #3  She underwent ileostomy takedown 12/18/2014. #4 she underwent ERCP for removal of common bile duct stent on 12/23/2017).  An After Visit Summary was printed and given to the patient.  FOLLOW UP: Return if symptoms worsen or fail to improve.  Signed:  Santiago Bumpers, MD           08/11/2023

## 2023-08-11 NOTE — Telephone Encounter (Signed)
Dr. Marisue Brooklyn will not take [facility] as a patient. The office of Dr. Loreta Ave called and reported the same thing was noted a few years back. Please refer to another provider.

## 2023-08-11 NOTE — Telephone Encounter (Signed)
Dr. Trilby Drummer office called to report that Ronalee Red can not be seen by Dr. Ander Slade

## 2023-08-11 NOTE — Telephone Encounter (Signed)
noted 

## 2023-08-11 NOTE — Telephone Encounter (Signed)
Taken care of during OV.

## 2023-08-11 NOTE — Patient Instructions (Signed)
Apply warm compress with baby shampoo for 15-20 min twice a day.

## 2023-08-14 ENCOUNTER — Other Ambulatory Visit: Payer: Self-pay | Admitting: Family Medicine

## 2023-09-08 NOTE — Progress Notes (Unsigned)
Dr Dohmeier's    PATIENT: Olivia Mckee DOB: 1961-03-28  REASON FOR VISIT: follow up HISTORY FROM: patient  No chief complaint on file.   HISTORY OF PRESENT ILLNESS:  09/08/23 ALL:  Olivia Mckee returns for follow up for complex sleep apnea on CPAP. She was last seen 08/2022 and reported difficulty tolerating pressure settings and FFM. She switched to a nasal pillow but did not feel it was any better tolerated. Since,   09/07/2022 ALL: Olivia Mckee returns for follow up for complex sleep apnea, mostly obstructive,  on CPAP. She was last seen 02/2022 and having more daytime sleepiness and generalized fatigue. I increased max pressure from 18 to 20 as she was consistently needing max pressure and AHI 4. Since, she reports doing very well. She is sleeping much better. She does feel that air pressure is too high. She wakes at night an feels her ears are popping. She will cut the CPAP off and restart so pressure is not as strong. Otherwise, doing great.   She has not taken alprazolam in a month. She reports stress levels are significantly better. Her grandson has moved back in with his mother in [place].     03/16/2022 ALL: Olivia Mckee returns for follow up for OSA on CPAP. She continues to use CPAP most every night. She reports not sleeping well. She wakes feeling really tired. She usually takes a nap around 11am for "a couple of hours." She goes to bed around 8:30 and sleeps until around 6-6:30. She has a FFM size medium. She feels that it does not have a good seal at times. She has contacted DME and is expecting a new mask. She is raising her grandsons. She does endorse more stress. She is taking alprazolam 0.5mg  up to three times a day (usually 1mg  at 3pm) and Ambien 10mg  daily around 8:30.     10/28/2021 ALL:  Olivia Mckee is a 62 y.o. female here today for follow up for OSA on CPAP.  HST 09/03/2021 showed severe, complex sleep apnea with mostly obstructive events. AHI was 48.8/hr. O2 nadir 73% with 214  minutes of hypoxia noted. She was started on AutoPAP. She reports doing fairly well. She is not dozing off as much during the day. She wakes feeling more refreshed. She continues to follow with rheumatology for rheumatoid arthritis versus fibromyalgia.     HISTORY: (copied from Dr Dohmeier's previous note)  Olivia Mckee is a 62 y.o. year old White or Caucasian female patient seen here as a referral on 07/23/2021 :   Chief concern according to patient :  " they say I have sleep apnea, I snore all my adult  life". 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 I am raising my 2 grandsons. I am worried, overwhelmed at times, I am sleeping a lot'- I am more concerned about memory- "    I have the pleasure of seeing Olivia Mckee today, a right-handed White or Caucasian female with a possible sleep disorder.  She has a  has a past medical history of Alcoholism (HCC), Allergic rhinitis, Anxiety and depression, Asthma, Cervical cancer (HCC), Choledocholithiasis, rheumatoid arthritis, MTX-   COPD (chronic obstructive pulmonary disease) (HCC), Diverticulosis (2015), Elevated LFTs, GERD (gastroesophageal reflux disease), History of adenomatous polyp of colon (04/02/2014), History of pyelonephritis (12/2019),  Hypercholesterolemia, Insomnia, Iron deficiency anemia (04/2021), Osteoporosis, Perforation of cecum, and Ventral hernia..    Sleep relevant medical history: Nocturia: RLS, anemia,  Tonsillectomy,  deviated septum nasal reconstruction-    Family medical /sleep history: mother  with  OSA, insomnia, sleep walkers.    Social history:  Patient is working as  and lives in a household with husband and 2 grand children- her daughter was a cocaine user- at the time 6 months and age 14.  The patient currently is staying home- Tobacco use/ quit 15 years ago .  ETOH use / sober for 6 years ,  Caffeine intake in form of Coffee( /) Soda( 2 cans in Pm ) Tea ( /) or energy drinks. Regular exercise in form of swimming. .    Hobbies : cooking.   Sleep habits are as follows: The patient's dinner time is between 5-7 PM. The patient goes to bed at 9 PM and continues to sleep for 2-3 hours, wakes for 1-2 bathroom breaks, the first time at 12 AM.   The preferred sleep position is sideways, with the support of 1 pillow.  Bed is raised too .Dreams are reportedly frequent/vivid.  5.30  AM is the usual rise time. The patient wakes up spontaneously.  He/ She reports not feeling refreshed or restored in AM, with symptoms such as dry mouth, morning headaches, and residual fatigue.  Naps are taken frequently, unscheduled, lasting from 15 to 120 minutes and are more refreshing than nocturnal sleep.    REVIEW OF SYSTEMS: Out of a complete 14 system review of symptoms, the patient complains only of the following symptoms, generalized pain, fatigue and all other reviewed systems are negative.  ESS: now 3/24, previously 14/24  ALLERGIES: Allergies  Allergen Reactions   Penicillins Anaphylaxis and Rash   Sulfa Antibiotics Anaphylaxis and Rash    HOME MEDICATIONS: Outpatient Medications Prior to Visit  Medication Sig Dispense Refill   albuterol (PROVENTIL) (2.5 MG/3ML) 0.083% nebulizer solution INHALE 3 ML BY NEBULIZATION EVERY 6 HOURS AS NEEDED FOR WHEEZING OR SHORTNESS OF BREATH 75 mL 12   ALPRAZolam (XANAX) 0.5 MG tablet Take 1 tablet (0.5 mg total) by mouth 3 (three) times daily as needed. 90 tablet 5   B-D TB SYRINGE 1CC/27GX1/2" 27G X 1/2" 1 ML MISC Inject into the skin.     benzonatate (TESSALON) 200 MG capsule Take 1 capsule (200 mg total) by mouth 3 (three) times daily as needed for cough. (Patient not taking: Reported on 08/11/2023) 30 capsule 1   cyclobenzaprine (FLEXERIL) 10 MG tablet TAKE ONE TABLET BY MOUTH 2 TIMES A DAY AS NEEDED FOR MUSCLE SPASMS. 60 tablet 1   erythromycin ophthalmic ointment Place 1 Application into the right eye 3 (three) times daily. 3.5 g 0   FERROUS SULFATE PO Take 325 mg by mouth daily.      fluconazole (DIFLUCAN) 150 MG tablet TAKE 1 TAB X 1 DOSE, THEN REPEAT IN 1 WEEK (Patient not taking: Reported on 01/14/2023) 2 tablet 0   fluticasone (FLONASE) 50 MCG/ACT nasal spray INSTILL 1 SPRAY INTO BOTH NOSTRILS DAILY 16 mL 2   Glycopyrrolate-Formoterol (BEVESPI AEROSPHERE) 9-4.8 MCG/ACT AERO Take 2 puffs by mouth in the morning and at bedtime. 32.1 each 3   leucovorin (WELLCOVORIN) 5 MG tablet Take 5 mg by mouth daily.     meloxicam (MOBIC) 15 MG tablet Take 1 tablet (15 mg total) by mouth daily. 14 tablet 0   methotrexate 250 MG/10ML injection 0.26ml     montelukast (SINGULAIR) 10 MG tablet TAKE 1 TABLET BY MOUTH EVERYDAY AT BEDTIME 90 tablet 3   pantoprazole (PROTONIX) 40 MG tablet Take 1 tablet (40 mg total) by mouth 2 (two) times daily. 180 tablet 1   pravastatin (  PRAVACHOL) 10 MG tablet TAKE 1 TABLET BY MOUTH EVERY DAY 90 tablet 0   traMADol (ULTRAM) 50 MG tablet Take 1 tablet (50 mg total) by mouth 4 (four) times daily as needed. 90 tablet 1   valACYclovir (VALTREX) 500 MG tablet TAKE 1 TABLET BY MOUTH THEN TAKE 1 TABLET 12 HOURS LATER (Patient not taking: Reported on 01/14/2023) 180 tablet 1   zolpidem (AMBIEN) 10 MG tablet TAKE 1 TABLET BY MOUTH EVERY DAY AT BEDTIME AS NEEDED 90 tablet 1   No facility-administered medications prior to visit.    PAST MEDICAL HISTORY: Past Medical History:  Diagnosis Date   Alcoholism (HCC)    Allergic rhinitis    Anxiety and depression    Arthritis of knee    bilat, CPPD+ OA   Asthma    Cervical cancer (HCC)    Choledocholithiasis    COPD (chronic obstructive pulmonary disease) (HCC)    Diverticulosis 2015   Sigmoid colon (noted on colonoscopy)   Elevated LFTs    GERD (gastroesophageal reflux disease)    History of adenomatous polyp of colon 04/02/2014   Recall 5 yrs   History of pyelonephritis 12/2019   Hypercholesterolemia    Inflammatory arthritis    +mild elev ESR, Rheum eval 07/2020: rapid resp to prednisone, felt to have  inflamm arth + OA and CCPD bilat knees. Methotrexate INJ q week.   Insomnia    Iron deficiency anemia 04/2021   started iron, hemoccults x 3 NEG   Lumbar spondylosis    Malignant tumor of cervix (HCC) 07/08/2021   Hx of @ age 44. HYST Removal Reason: Cx Cancer   Myalgia, unspecified site    + arthralgias---she is tender EVERYWHERE (summer 2021)->rheum ref   OSA (obstructive sleep apnea) 08/2021   2022 sleep study (Dr. Vickey Huger) severe, with oxygen nadir of 73%. CPAP.   Osteoarthritis, knee    Osteoporosis    Perforation of cecum    perf'd in context of SBO that followed GB surgery; no colon removed.   Ventral hernia     PAST SURGICAL HISTORY: Past Surgical History:  Procedure Laterality Date   BREAST BIOPSY  2010   benign cyst   CHOLECYSTECTOMY     COLONOSCOPY  04/02/2014   2015 adenoma x 2->recall 5 yrs ([facility])   Iliostomy     +  takedown   MOUTH SURGERY     NOSE SURGERY     PARTIAL HYSTERECTOMY  1983   perfor colon repair     SBO after gallbladder surgery   POLYSOMNOGRAPHY  08/2021   Sleep study showed severe OSA, CPAP recommended.   SALPINGOOPHORECTOMY Bilateral 05/10/2014   TONSILLECTOMY     VENTRAL HERNIA REPAIR  2016   open, with mesh    FAMILY HISTORY: Family History  Problem Relation Age of Onset   Diabetes Mother    Hypertension Mother    Hearing loss Mother    Depression Mother    Diabetes Father    Hearing loss Father    Early death Father    Stroke Maternal Grandmother    Kidney disease Maternal Grandmother    Diabetes Maternal Grandmother    Heart disease Maternal Grandmother    Arthritis Maternal Grandmother    Stroke Maternal Grandfather    Prostate cancer Maternal Grandfather    Diabetes Maternal Grandfather    Heart disease Maternal Grandfather    Arthritis Maternal Grandfather    Alcohol abuse Maternal Grandfather    Depression Maternal Grandfather  Diabetes Paternal Grandmother    Alcohol abuse Paternal Grandmother     Diabetes Paternal Grandfather    Alcohol abuse Paternal Grandfather    Heart disease Paternal Grandfather     SOCIAL HISTORY: Social History   Socioeconomic History   Marital status: Married    Spouse name: Jillyn Hidden   Number of children: 1   Years of education: Not on file   Highest education level: Some college, no degree  Occupational History   Not on file  Tobacco Use   Smoking status: Former    Current packs/day: 0.00    Average packs/day: 1 pack/day for 25.0 years (25.0 ttl pk-yrs)    Types: Cigarettes    Start date: 11/23/1985    Quit date: 11/23/2010    Years since quitting: 12.8   Smokeless tobacco: Never  Substance and Sexual Activity   Alcohol use: No   Drug use: No   Sexual activity: Not on file  Other Topics Concern   Not on file  Social History Narrative   Married, 1 daughter, several grandkids (2 of whom pt is raising).   Educ: some college   Occup: retired Administrator.   Tob: 20 pack-yr hx, quit approx 2005.   Alc: hx of alcoholism, quit 2018.   No hx of drug abuse.      left handed   Caffeine: 2 12oz can a day   Social Determinants of Health   Financial Resource Strain: Low Risk  (08/11/2023)   Overall Financial Resource Strain (CARDIA)    Difficulty of Paying Living Expenses: Not hard at all  Food Insecurity: No Food Insecurity (08/11/2023)   Hunger Vital Sign    Worried About Running Out of Food in the Last Year: Never true    Ran Out of Food in the Last Year: Never true  Transportation Needs: No Transportation Needs (08/11/2023)   PRAPARE - Administrator, Civil Service (Medical): No    Lack of Transportation (Non-Medical): No  Physical Activity: Insufficiently Active (08/11/2023)   Exercise Vital Sign    Days of Exercise per Week: 3 days    Minutes of Exercise per Session: 30 min  Stress: Stress Concern Present (08/11/2023)   [employer] of Occupational Health - Occupational Stress Questionnaire    Feeling of Stress : To some extent   Social Connections: Moderately Isolated (08/11/2023)   Social Connection and Isolation Panel [NHANES]    Frequency of Communication with Friends and Family: More than three times a week    Frequency of Social Gatherings with Friends and Family: Never    Attends Religious Services: Never    Database administrator or Organizations: No    Attends Engineer, structural: Not on file    Marital Status: Married  Intimate Partner Violence: Unknown (04/04/2023)   Received from [employer], [facility]   HITS    Physically Hurt: Not on file    Insult or Talk Down To: Not on file    Threaten Physical Harm: Not on file    Scream or Curse: Not on file     PHYSICAL EXAM  There were no vitals filed for this visit.    There is no height or weight on file to calculate BMI.  Generalized: Well developed, in no acute distress  Cardiology: normal rate and rhythm, no murmur noted Respiratory: clear to auscultation bilaterally  Neurological examination  Mentation: Alert oriented to time, place, history taking. Follows all commands speech and language fluent Cranial  nerve II-XII: Pupils were equal round reactive to light. Extraocular movements were full, visual field were full  Motor: The motor testing reveals 5 over 5 strength of all 4 extremities. Good symmetric motor tone is noted throughout.  Gait and station: Gait is normal.    DIAGNOSTIC DATA (LABS, IMAGING, TESTING) - I reviewed patient records, labs, notes, testing and imaging myself where available.      No data to display           Lab Results  Component Value Date   WBC 5.2 06/02/2023   HGB 14.7 06/02/2023   HCT 44.1 06/02/2023   MCV 96.4 06/02/2023   PLT 326.0 06/02/2023      Component Value Date/Time   NA 136 06/02/2023 0944   K 4.2 06/02/2023 0944   CL 100 06/02/2023 0944   CO2 25 06/02/2023 0944   GLUCOSE 85 06/02/2023 0944   BUN 14 06/02/2023 0944   CREATININE 0.86 06/02/2023 0944   CREATININE 0.81  11/20/2022 1415   CALCIUM 10.3 06/02/2023 0944   PROT 7.0 06/02/2023 0944   ALBUMIN 4.6 06/02/2023 0944   AST 26 06/02/2023 0944   ALT 42 (H) 06/02/2023 0944   ALKPHOS 82 06/02/2023 0944   BILITOT 0.4 06/02/2023 0944   Lab Results  Component Value Date   CHOL 158 06/02/2023   HDL 58.80 06/02/2023   LDLCALC 77 06/02/2023   LDLDIRECT 100.0 04/25/2021   TRIG 113.0 06/02/2023   CHOLHDL 3 06/02/2023   Lab Results  Component Value Date   HGBA1C 5.6 11/25/2022   HGBA1C 5.6 11/25/2022   HGBA1C 5.6 (A) 11/25/2022   HGBA1C 5.6 11/25/2022   No results found for: "VITAMINB12" Lab Results  Component Value Date   TSH 2.05 04/25/2021     ASSESSMENT AND PLAN 62 y.o. year old female  has a past medical history of Alcoholism (HCC), Allergic rhinitis, Anxiety and depression, Arthritis of knee, Asthma, Cervical cancer (HCC), Choledocholithiasis, COPD (chronic obstructive pulmonary disease) (HCC), Diverticulosis (2015), Elevated LFTs, GERD (gastroesophageal reflux disease), History of adenomatous polyp of colon (04/02/2014), History of pyelonephritis (12/2019), Hypercholesterolemia, Inflammatory arthritis, Insomnia, Iron deficiency anemia (04/2021), Lumbar spondylosis, Malignant tumor of cervix (HCC) (07/08/2021), Myalgia, unspecified site, OSA (obstructive sleep apnea) (08/2021), Osteoarthritis, knee, Osteoporosis, Perforation of cecum, and Ventral hernia. here with   No diagnosis found.     Tally Pawlicki is doing well on CPAP therapy. She does note increased pressure at periods during the night and feels this is making her ears pop. I will decrease max pressure to 19cmH20. AHI now 2.9/hr. She was encouraged to continue using CPAP nightly and for greater than 4 hours each night. We will update supply orders as indicated. Risks of untreated sleep apnea review and education materials provided. Healthy lifestyle habits encouraged. She will follow up in 4-6 months, sooner if needed. She verbalizes  understanding and agreement with this plan.    No orders of the defined types were placed in this encounter.     No orders of the defined types were placed in this encounter.      Shawnie Dapper, FNP-C 09/08/2023, 9:10 AM [facility] [address] [place], [place] [address] [phone]

## 2023-09-08 NOTE — Patient Instructions (Incomplete)
Please continue using your CPAP regularly. While your insurance requires that you use CPAP at least 4 hours each night on 70% of the nights, I recommend, that you not skip any nights and use it throughout the night if you can. Getting used to CPAP and staying with the treatment long term does take time and patience and discipline. Untreated obstructive sleep apnea when it is moderate to severe can have an adverse impact on cardiovascular health and raise her risk for heart disease, arrhythmias, hypertension, congestive heart failure, stroke and diabetes. Untreated obstructive sleep apnea causes sleep disruption, nonrestorative sleep, and sleep deprivation. This can have an impact on your day to day functioning and cause daytime sleepiness and impairment of cognitive function, memory loss, mood disturbance, and problems focussing. Using CPAP regularly can improve these symptoms.  We will update supply orders, today. I will resend orders for the pressure to be reduced on your machine. Please follow up with [facility] to make sure you have all your supplies. Talk to your PCP about your headaches. They can do a workup with you and if they feel you need a neurologic evaluation have them send a referral back to our office and we will get you seen! Topirmate and propranolol could be medicaitons to help with tension style headaches. Venlafaxine is also good for mood management and headache management.   Follow up in 1 year    GENERAL HEADACHE INFORMATION:   Natural supplements: Magnesium Oxide or Magnesium Glycinate 500 mg at bed (up to 800 mg daily) Coenzyme Q10 300 mg in AM Vitamin B2- 200 mg twice a day   Add 1 supplement at a time since even natural supplements can have undesirable side effects. You can sometimes buy supplements cheaper (especially Coenzyme Q10) at www.WebmailGuide.co.za or at [facility].  Migraine with aura: There is increased risk for stroke in women with migraine with aura and a contraindication for  the combined contraceptive pill for use by women who have migraine with aura. The risk for women with migraine without aura is lower. However other risk factors like smoking are far more likely to increase stroke risk than migraine. There is a recommendation for no smoking and for the use of OCPs without estrogen such as progestogen only pills particularly for women with migraine with aura.Marland Kitchen People who have migraine headaches with auras may be 3 times more likely to have a stroke caused by a blood clot, compared to migraine patients who don't see auras. Women who take hormone-replacement therapy may be 30 percent more likely to suffer a clot-based stroke than women not taking medication containing estrogen. Other risk factors like smoking and high blood pressure may be  much more important.    Vitamins and herbs that show potential:   Magnesium: Magnesium (250 mg twice a day or 500 mg at bed) has a relaxant effect on smooth muscles such as blood vessels. Individuals suffering from frequent or daily headache usually have low magnesium levels which can be increase with daily supplementation of 400-750 mg. Three trials found 40-90% average headache reduction  when used as a preventative. Magnesium may help with headaches are aura, the best evidence for magnesium is for migraine with aura is its thought to stop the cortical spreading depression we believe is the pathophysiology of migraine aura.Magnesium also demonstrated the benefit in menstrually related migraine.  Magnesium is part of the messenger system in the serotonin cascade and it is a good muscle relaxant.  It is also useful for constipation which  can be a side effect of other medications used to treat migraine. Good sources include nuts, whole grains, and tomatoes. Side Effects: loose stool/diarrhea  Riboflavin (vitamin B 2) 200 mg twice a day. This vitamin assists nerve cells in the production of ATP a principal energy storing molecule.  It is  necessary for many chemical reactions in the body.  There have been at least 3 clinical trials of riboflavin using 400 mg per day all of which suggested that migraine frequency can be decreased.  All 3 trials showed significant improvement in over half of migraine sufferers.  The supplement is found in bread, cereal, milk, meat, and poultry.  Most Americans get more riboflavin than the recommended daily allowance, however riboflavin deficiency is not necessary for the supplements to help prevent headache. Side effects: energizing, green urine   Coenzyme Q10: This is present in almost all cells in the body and is critical component for the conversion of energy.  Recent studies have shown that a nutritional supplement of CoQ10 can reduce the frequency of migraine attacks by improving the energy production of cells as with riboflavin.  Doses of 150 mg twice a day have been shown to be effective.   Melatonin: Increasing evidence shows correlation between melatonin secretion and headache conditions.  Melatonin supplementation has decreased headache intensity and duration.  It is widely used as a sleep aid.  Sleep is natures way of dealing with migraine.  A dose of 3 mg is recommended to start for headaches including cluster headache. Higher doses up to 15 mg has been reviewed for use in Cluster headache and have been used. The rationale behind using melatonin for cluster is that many theories regarding the cause of Cluster headache center around the disruption of the normal circadian rhythm in the brain.  This helps restore the normal circadian rhythm.   HEADACHE DIET: Foods and beverages which may trigger migraine Note that only 20% of headache patients are food sensitive. You will know if you are food sensitive if you get a headache consistently 20 minutes to 2 hours after eating a certain food. Only cut out a food if it causes headaches, otherwise you might remove foods you enjoy! What matters most for diet is  to eat a well balanced healthy diet full of vegetables and low fat protein, and to not miss meals.   Chocolate, other sweets ALL cheeses except cottage and cream cheese Dairy products, yogurt, sour cream, ice cream Liver Meat extracts (Bovril, Marmite, meat tenderizers) Meats or fish which have undergone aging, fermenting, pickling or smoking. These include: Hotdogs,salami,Lox,sausage, mortadellas,smoked salmon, pepperoni, Pickled herring Pods of broad bean (English beans, Chinese pea pods, [place] (fava) beans, lima and navy beans Ripe avocado, ripe banana Yeast extracts or active yeast preparations such as Brewer's or Fleishman's (commercial bakes goods are permitted) Tomato based foods, pizza (lasagna, etc.)   MSG (monosodium glutamate) is disguised as many things; look for these common aliases: Monopotassium glutamate Autolysed yeast Hydrolysed protein Sodium caseinate "flavorings" "all natural preservatives" Nutrasweet   Avoid all other foods that convincingly provoke headaches.   Resources: The Dizzy [place] Laundry Your Headache Diet, migrainestrong.com  https://zamora-andrews.com/   Caffeine and Migraine For patients that have migraine, caffeine intake more than 3 days per week can lead to dependency and increased migraine frequency. I would recommend cutting back on your caffeine intake as best you can. The recommended amount of caffeine is 200-300 mg daily, although migraine patients may experience dependency at even lower doses. While  you may notice an increase in headache temporarily, cutting back will be helpful for headaches in the long run. For more information on caffeine and migraine, visit: https://americanmigrainefoundation.org/resource-library/caffeine-and-migraine/   Headache Prevention Strategies:   1. Maintain a headache diary; learn to identify and avoid triggers.  - This can be a simple note where you log when you had  a headache, associated symptoms, and medications used - There are several smartphone apps developed to help track migraines: Migraine Buddy, Migraine Monitor, Curelator N1-Headache App   Common triggers include: Emotional triggers: Emotional/Upset family or friends Emotional/Upset occupation Business reversal/success Anticipation anxiety Crisis-serious Post-crisis periodNew job/position   Physical triggers: Vacation Day Weekend Strenuous Exercise High Altitude Location New Move Menstrual Day Physical Illness Oversleep/Not enough sleep Weather changes Light: Photophobia or light sesnitivity treatment involves a balance between desensitization and reduction in overly strong input. Use dark polarized glasses outside, but not inside. Avoid bright or fluorescent light, but do not dim environment to the point that going into a normally lit room hurts. Consider FL-41 tint lenses, which reduce the most irritating wavelengths without blocking too much light.  These can be obtained at axonoptics.com or theraspecs.com Foods: see list above.   2. Limit use of acute treatments (over-the-counter medications, triptans, etc.) to no more than 2 days per week or 10 days per month to prevent medication overuse headache (rebound headache).     3. Follow a regular schedule (including weekends and holidays): Don't skip meals. Eat a balanced diet. 8 hours of sleep nightly. Minimize stress. Exercise 30 minutes per day. Being overweight is associated with a 5 times increased risk of chronic migraine. Keep well hydrated and drink 6-8 glasses of water per day.   4. Initiate non-pharmacologic measures at the earliest onset of your headache. Rest and quiet environment. Relax and reduce stress. Breathe2Relax is a free app that can instruct you on    some simple relaxtion and breathing techniques. Http://Dawnbuse.com is a    free website that provides teaching videos on relaxation.  Also, there are  many apps  that   can be downloaded for "mindful" relaxation.  An app called YOGA NIDRA will help walk you through mindfulness. Another app called Calm can be downloaded to give you a structured mindfulness guide with daily reminders and skill development. Headspace for guided meditation Mindfulness Based Stress Reduction Online Course: www.palousemindfulness.com Cold compresses.   5. Don't wait!! Take the maximum allowable dosage of prescribed medication at the first sign of migraine.   6. Compliance:  Take prescribed medication regularly as directed and at the first sign of a migraine.   7. Communicate:  Call your physician when problems arise, especially if your headaches change, increase in frequency/severity, or become associated with neurological symptoms (weakness, numbness, slurred speech, etc.). Proceed to emergency room if you experience new or worsening symptoms or symptoms do not resolve, if you have new neurologic symptoms or if headache is severe, or for any concerning symptom.   8. Headache/pain management therapies: Consider various complementary methods, including medication, behavioral therapy, psychological counselling, biofeedback, massage therapy, acupuncture, dry needling, and other modalities.  Such measures may reduce the need for medications. Counseling for pain management, where patients learn to function and ignore/minimize their pain, seems to work very well.   9. Recommend changing family's attention and focus away from patient's headaches. Instead, emphasize daily activities. If first question of day is 'How are your headaches/Do you have a headache today?', then patient will constantly think about headaches, thus  making them worse. Goal is to re-direct attention away from headaches, toward daily activities and other distractions.   10. Helpful Websites: www.AmericanHeadacheSociety.org PatentHood.ch www.headaches.org TightMarket.nl www.achenet.org

## 2023-09-09 ENCOUNTER — Encounter: Payer: Self-pay | Admitting: Family Medicine

## 2023-09-09 ENCOUNTER — Ambulatory Visit (INDEPENDENT_AMBULATORY_CARE_PROVIDER_SITE_OTHER): Payer: Commercial Managed Care - PPO | Admitting: Family Medicine

## 2023-09-09 VITALS — BP 134/81 | HR 74 | Ht 64.0 in | Wt 183.5 lb

## 2023-09-09 DIAGNOSIS — G4730 Sleep apnea, unspecified: Secondary | ICD-10-CM | POA: Diagnosis not present

## 2023-09-09 DIAGNOSIS — F411 Generalized anxiety disorder: Secondary | ICD-10-CM | POA: Diagnosis not present

## 2023-09-09 DIAGNOSIS — G4739 Other sleep apnea: Secondary | ICD-10-CM

## 2023-09-09 DIAGNOSIS — G44209 Tension-type headache, unspecified, not intractable: Secondary | ICD-10-CM

## 2023-09-09 LAB — HEPATIC FUNCTION PANEL
ALT: 27 U/L (ref 7–35)
AST: 20 (ref 13–35)
Alkaline Phosphatase: 102 (ref 25–125)
Bilirubin, Total: 0.3

## 2023-09-09 LAB — COMPREHENSIVE METABOLIC PANEL
Albumin: 4.6 (ref 3.5–5.0)
Calcium: 10.2 (ref 8.7–10.7)
Globulin: 2.5
eGFR: 81

## 2023-09-09 LAB — BASIC METABOLIC PANEL
BUN: 17 (ref 4–21)
CO2: 21 (ref 13–22)
Chloride: 101 (ref 99–108)
Creatinine: 0.8 (ref 0.5–1.1)
Glucose: 84
Potassium: 4.2 meq/L (ref 3.5–5.1)
Sodium: 141 (ref 137–147)

## 2023-09-09 LAB — CBC AND DIFFERENTIAL
HCT: 47 — AB (ref 36–46)
Hemoglobin: 15.5 (ref 12.0–16.0)
Neutrophils Absolute: 3.7
Platelets: 344 10*3/uL (ref 150–400)
WBC: 6.6

## 2023-09-09 LAB — CBC: RBC: 4.9 (ref 3.87–5.11)

## 2023-09-09 NOTE — Telephone Encounter (Signed)
Appt changed to 2pm

## 2023-09-10 ENCOUNTER — Other Ambulatory Visit: Payer: Self-pay | Admitting: Family Medicine

## 2023-09-10 ENCOUNTER — Ambulatory Visit: Payer: Commercial Managed Care - PPO | Admitting: Urgent Care

## 2023-09-10 ENCOUNTER — Ambulatory Visit: Payer: Commercial Managed Care - PPO | Admitting: Family Medicine

## 2023-09-10 VITALS — BP 102/71 | HR 61 | Temp 97.7°F | Ht 64.0 in | Wt 183.2 lb

## 2023-09-10 DIAGNOSIS — G44211 Episodic tension-type headache, intractable: Secondary | ICD-10-CM

## 2023-09-10 DIAGNOSIS — F439 Reaction to severe stress, unspecified: Secondary | ICD-10-CM

## 2023-09-10 MED ORDER — BUTALBITAL-APAP-CAFFEINE 50-325-40 MG PO TABS
1.0000 | ORAL_TABLET | Freq: Four times a day (QID) | ORAL | 0 refills | Status: DC | PRN
Start: 2023-09-10 — End: 2024-01-18

## 2023-09-10 MED ORDER — BUSPIRONE HCL 10 MG PO TABS
ORAL_TABLET | ORAL | 3 refills | Status: DC
Start: 2023-09-10 — End: 2024-01-20

## 2023-09-10 NOTE — Assessment & Plan Note (Signed)
Discussed meditation techniques, deep breathing techniques. Pt has PRN xanax. Recommended re-starting (looks like pt took in 2018) buspirone with goal dose 10mg  BID. Start at 5mg  once daily and follow titration schedule.

## 2023-09-10 NOTE — Patient Instructions (Signed)
Your headaches are called tension headaches.  Please read the attached information regarding this condition. Please start taking the buspirone per taper instructions to help with your anxiety and stress levels. Try the breathing techniques that we discussed today to help manage stress. Apply a moist warm compress (buy a microwaveable heating pad at the pharmacy) and apply to neck several times daily. Try ischemic release massage to your neck, which is sustained pressure to the areas of pain. Please call the physical therapist to schedule a dry needling session to your neck. This should help resolve the neck tension. You may need several sessions. You can use the butalbital as needed to help with your headaches, however use sparingly as overuse can cause rebound headaches.  If your symptoms persist or fail to respond to treatment, we may consider an xray of your neck to ensure it is not arthritic in nature or related to stenosis of the spine.  Please keep your appointment schedule for Dec with Dr. Milinda Cave. If you need [place] prior, please return for recheck or reach out via MyChart.

## 2023-09-10 NOTE — Assessment & Plan Note (Signed)
Chronic Tension Headaches Daily headaches for 10 years, located in the parietal and occipital regions. Associated with stress and tension. Over-the-counter analgesics provide temporary relief. Tramadol provides more sustained relief but is used sparingly due to history of alcoholism. Tenderness and tightness in the neck muscles. -Refer to physical therapy for dry needling. -Recommend use of a microwaveable heat pack on the neck. -Advise on self-massage techniques for neck muscle tension. -Fioricet (butalbital) if other measures are ineffective, with caution regarding potential for rebound headaches.

## 2023-09-10 NOTE — Progress Notes (Signed)
Established Patient Office Visit  Subjective:  Patient ID: Olivia Mckee, female    DOB: August 02, 1961  Age: 62 y.o. MRN: [id number]  Chief Complaint  Patient presents with   Headache    Headaches that have seem to be getting worse recently. She thinks its due to increased stress.    The patient, a 62 year old female with a history of alcoholism, has been experiencing chronic headaches for the past ten years. The headaches began after they quit drinking and have persisted despite various interventions. The patient initially suspected the headaches were related to their vision, but an eye examination ruled this out. The headaches are described as being located in the parietal and occipital regions, with some discomfort extending to the base of the neck. The patient has tried various over-the-counter medications, including BC powders and Tylenol, but these have provided only temporary relief. The patient has also tried tramadol, which seemed to alleviate the headache temporarily.  The patient has been under significant stress, raising their grandchild for the past ten years and dealing with the absence of their spouse who works out of town. They also use a CPAP machine for sleep apnea, which they initially thought might be contributing to the headaches, but the headaches predate the use of the machine.  The patient has been evaluated by a neurologist, who ruled out a tumor as a cause of the headaches. They also see a rheumatologist for an unspecified condition. The patient's headaches are daily and constant, significantly impacting their quality of life.    Patient Active Problem List   Diagnosis Date Noted   Stress 09/10/2023   Asthmatic bronchitis with acute exacerbation 01/11/2023   Obstructive sleep apnea 10/10/2021   Excessive daytime sleepiness 09/08/2021   Snoring 09/08/2021   Polyarthralgia 04/22/2021   Chronic fatigue syndrome 04/22/2021   Rheumatoid arthritis (HCC) 10/14/2020    Dysuria 10/14/2020   Pyelonephritis 01/05/2020   Hypokalemia 01/02/2020   Hyponatremia 01/02/2020   UTI (urinary tract infection) 01/02/2020   Mixed hyperlipidemia 07/26/2019   Fatty liver 06/06/2019   GERD (gastroesophageal reflux disease) 08/16/2018   Allergic rhinitis 08/16/2018   Vitamin D deficiency 03/29/2018   Chronic cough 10/21/2017   Chronic obstructive pulmonary disease (HCC) 09/08/2017   Intractable episodic tension-type headache 07/27/2017   GAD (generalized anxiety disorder) 09/04/2016   Moderate episode of recurrent major depressive disorder (HCC) 10/09/2014   Right ovarian cyst 02/21/2014   Umbilical hernia 02/21/2014   Past Medical History:  Diagnosis Date   Alcoholism (HCC)    Allergic rhinitis    Anxiety and depression    Arthritis of knee    bilat, CPPD+ OA   Asthma    Cervical cancer (HCC)    Choledocholithiasis    COPD (chronic obstructive pulmonary disease) (HCC)    Diverticulosis 2015   Sigmoid colon (noted on colonoscopy)   Elevated LFTs    GERD (gastroesophageal reflux disease)    History of adenomatous polyp of colon 04/02/2014   Recall 5 yrs   History of pyelonephritis 12/2019   Hypercholesterolemia    Inflammatory arthritis    +mild elev ESR, Rheum eval 07/2020: rapid resp to prednisone, felt to have inflamm arth + OA and CCPD bilat knees. Methotrexate INJ q week.   Insomnia    Iron deficiency anemia 04/2021   started iron, hemoccults x 3 NEG   Lumbar spondylosis    Malignant tumor of cervix (HCC) 07/08/2021   Hx of @ age 60. HYST Removal Reason: Cx Cancer  Myalgia, unspecified site    + arthralgias---she is tender EVERYWHERE (summer 2021)->rheum ref   OSA (obstructive sleep apnea) 08/2021   2022 sleep study (Dr. Vickey Huger) severe, with oxygen nadir of 73%. CPAP.   Osteoarthritis, knee    Osteoporosis    Perforation of cecum    perf'd in context of SBO that followed GB surgery; no colon removed.   Ventral hernia    Social History    Tobacco Use   Smoking status: Former    Current packs/day: 0.00    Average packs/day: 1 pack/day for 25.0 years (25.0 ttl pk-yrs)    Types: Cigarettes    Start date: 11/23/1985    Quit date: 11/23/2010    Years since quitting: 12.8   Smokeless tobacco: Never  Substance Use Topics   Alcohol use: No   Drug use: No      ROS: as noted in HPI  Objective:     BP 102/71   Pulse 61   Temp 97.7 F (36.5 C)   Ht 5\' 4"  (1.626 m)   Wt 183 lb 3.2 oz (83.1 kg)   SpO2 98%   BMI 31.45 kg/m    Physical Exam Vitals and nursing note reviewed.  Constitutional:      General: She is not in acute distress.    Appearance: Normal appearance. She is well-developed. She is not ill-appearing, toxic-appearing or diaphoretic.  HENT:     Head: Normocephalic and atraumatic.     Right Ear: Tympanic membrane, ear canal and external ear normal. There is no impacted cerumen.     Left Ear: Tympanic membrane, ear canal and external ear normal. There is no impacted cerumen.     Nose: Nose normal.     Mouth/Throat:     Mouth: Mucous membranes are moist.     Pharynx: Oropharynx is clear. No oropharyngeal exudate or posterior oropharyngeal erythema.  Eyes:     General: No scleral icterus.       Right eye: No discharge.        Left eye: No discharge.     Extraocular Movements: Extraocular movements intact.     Pupils: Pupils are equal, round, and reactive to light.  Neck:     Thyroid: No thyroid mass, thyromegaly or thyroid tenderness.     Trachea: Trachea and phonation normal.     Meningeal: Brudzinski's sign absent.   Cardiovascular:     Rate and Rhythm: Normal rate and regular rhythm.     Pulses: Normal pulses.  Pulmonary:     Effort: Pulmonary effort is normal. No respiratory distress.  Abdominal:     General: Abdomen is flat.  Musculoskeletal:     Cervical back: Normal range of motion and neck supple. No rigidity or tenderness. Muscular tenderness (tense, tender knotted paracervicals  bilaterally) present. No spinous process tenderness.     Right lower leg: No edema.     Left lower leg: No edema.  Lymphadenopathy:     Cervical: No cervical adenopathy.     Right cervical: No superficial, deep or posterior cervical adenopathy.    Left cervical: No superficial, deep or posterior cervical adenopathy.  Skin:    General: Skin is warm and dry.     Coloration: Skin is not jaundiced.     Findings: No bruising, erythema or rash.  Neurological:     General: No focal deficit present.     Mental Status: She is alert and oriented to person, place, and time.     Cranial  Nerves: Cranial nerves 2-12 are intact. No cranial nerve deficit or facial asymmetry.     Sensory: Sensation is intact. No sensory deficit.     Motor: No weakness.     Coordination: Coordination is intact.     Gait: Gait is intact.  Psychiatric:        Mood and Affect: Mood normal.        Behavior: Behavior normal.      No results found for any visits on 09/10/23.    The 10-year ASCVD risk score (Arnett DK, et al., 2019) is: 2.2%  Assessment & Plan:  Intractable episodic tension-type headache Assessment & Plan: Chronic Tension Headaches Daily headaches for 10 years, located in the parietal and occipital regions. Associated with stress and tension. Over-the-counter analgesics provide temporary relief. Tramadol provides more sustained relief but is used sparingly due to history of alcoholism. Tenderness and tightness in the neck muscles. -Refer to physical therapy for dry needling. -Recommend use of a microwaveable heat pack on the neck. -Advise on self-massage techniques for neck muscle tension. -Fioricet (butalbital) if other measures are ineffective, with caution regarding potential for rebound headaches.  Orders: -     Butalbital-APAP-Caffeine; Take 1 tablet by mouth every 6 (six) hours as needed for headache.  Dispense: 30 tablet; Refill: 0 -     Ambulatory referral to Physical  Therapy  Stress Assessment & Plan: Discussed meditation techniques, deep breathing techniques. Pt has PRN xanax. Recommended re-starting (looks like pt took in 2018) buspirone with goal dose 10mg  BID. Start at 5mg  once daily and follow titration schedule.  Orders: -     busPIRone HCl; Take 1/2 tab once daily x 5 days, then increase to 1/2 tab twice daily x 5 days, then 1 tab in the AM, 1/2 tab in the PM x 5 days, then continue with maintenance 1 tab BID  Dispense: 60 tablet; Refill: 3     Keep follow up on 11/12/23, return sooner as needed.   Maretta Bees, PA

## 2023-09-13 ENCOUNTER — Ambulatory Visit: Payer: Commercial Managed Care - PPO | Admitting: Family Medicine

## 2023-09-15 IMAGING — DX DG LUMBAR SPINE COMPLETE 4+V
5 series · 5 of 5 positions shown · non-contrast
Comparison: None Available.

CLINICAL DATA: Bilateral low back pain without sciatica. No injury.

EXAM:
LUMBAR SPINE - COMPLETE 4+ VIEW

[l-spine ap]
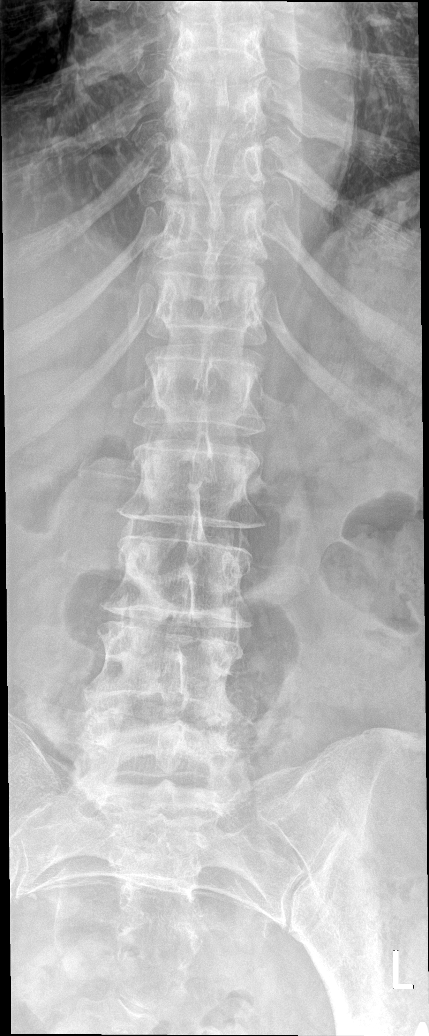

[l-spine obl (1 of 2)]
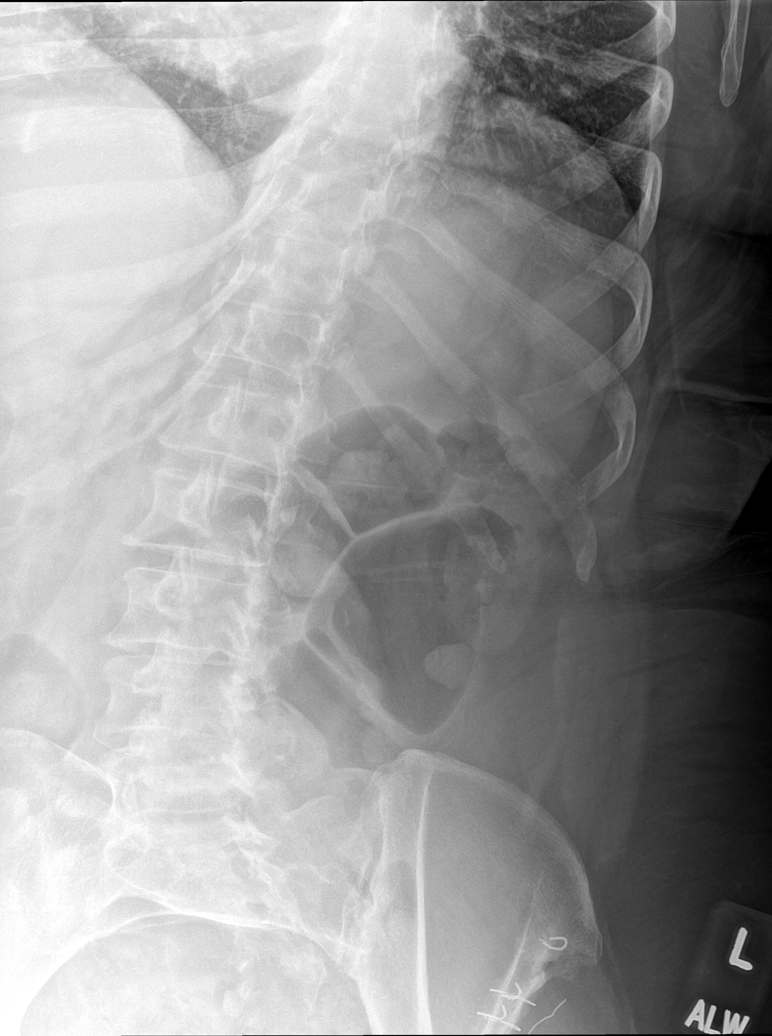

[l-spine obl (2 of 2)]
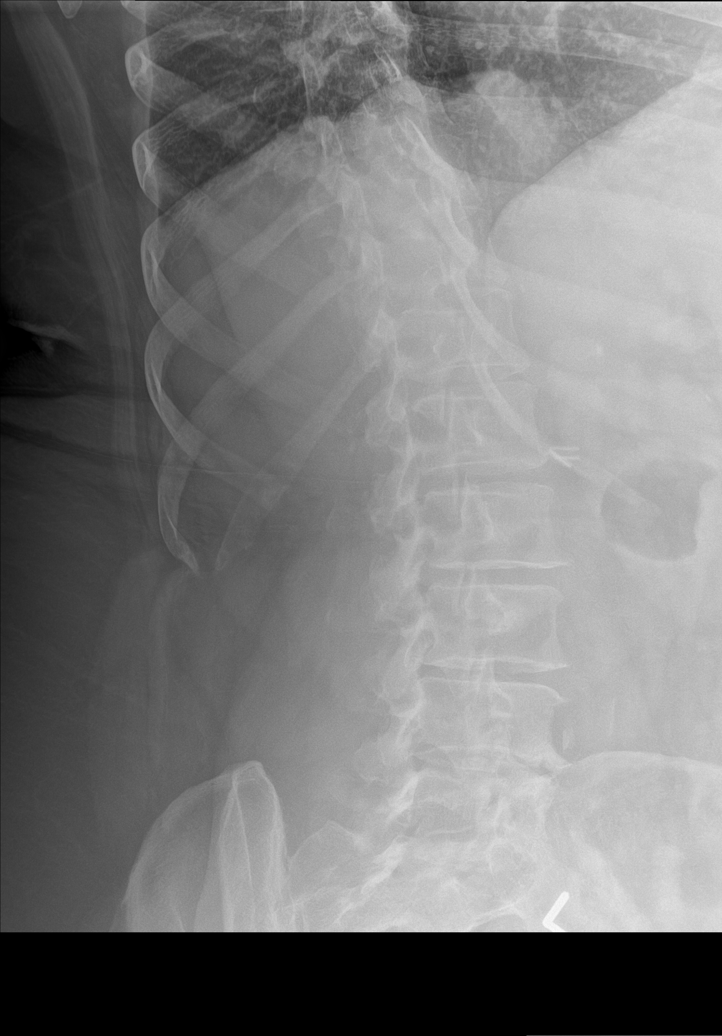

[l-spine lat]
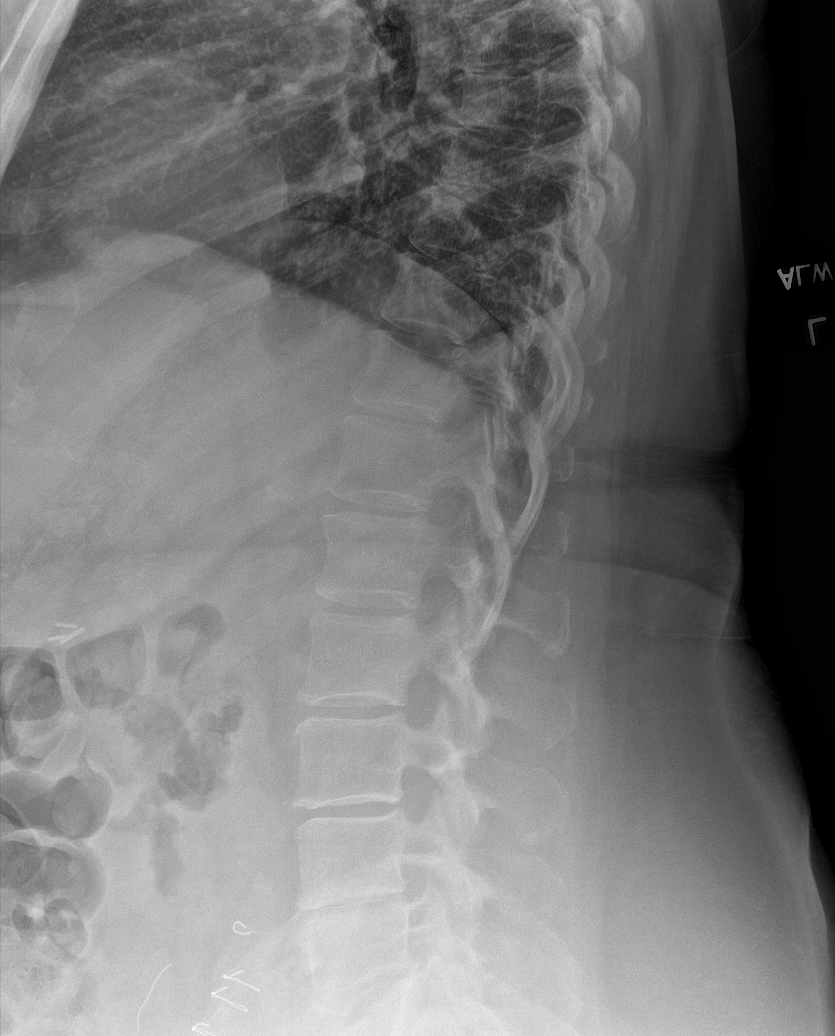

[l-spine spot]
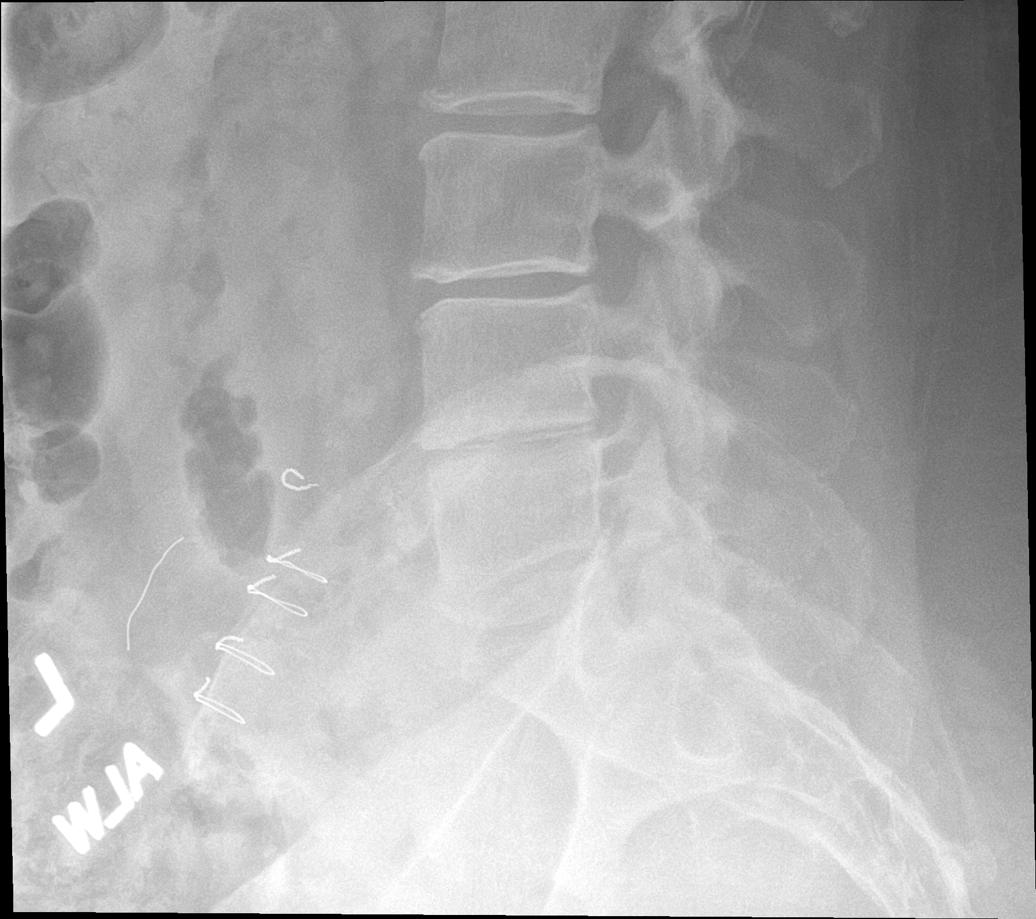

[5 of 5 positions shown; findings below may reference images not displayed]

FINDINGS: No fracture or malalignment. Multilevel degenerative disc disease.
No other significant abnormalities.
IMPRESSION: Multilevel degenerative disc disease.

## 2023-09-18 ENCOUNTER — Encounter: Payer: Self-pay | Admitting: Family Medicine

## 2023-09-18 DIAGNOSIS — Z1211 Encounter for screening for malignant neoplasm of colon: Secondary | ICD-10-CM

## 2023-09-20 NOTE — Telephone Encounter (Signed)
Ok, cologuard ordered.

## 2023-10-07 LAB — COLOGUARD: COLOGUARD: POSITIVE — AB

## 2023-10-08 ENCOUNTER — Encounter: Payer: Self-pay | Admitting: Family Medicine

## 2023-10-08 NOTE — Telephone Encounter (Signed)
I am confused. I referred her to Dr. Loreta Ave earlier this year. There is a message in her chart 08/11/2023 stating that Dr. Loreta Ave refused the consult. Per patient's request I then ordered the referral to [facility]. It looks like they have tried unsuccessfully to contact her. Please clarify with patient and if necessary follow-up on that this referral with [facility]. I will refer Gerald wherever she wants to go as long as they will accept her.  Reassured her, though, that the positive Cologuard does not automatically mean she has colon cancer.  It just means she needs to be checked further with colonoscopy.

## 2023-10-11 NOTE — Telephone Encounter (Addendum)
[facility] 08/2020- will not go back  Dr. Loreta Ave is going to be out until next year due to injury. Advised pt to call where referral location currently is to f/u on positive cologuard

## 2023-10-12 ENCOUNTER — Telehealth: Payer: Self-pay | Admitting: Family Medicine

## 2023-10-12 ENCOUNTER — Encounter: Payer: Self-pay | Admitting: Family Medicine

## 2023-10-12 NOTE — Telephone Encounter (Signed)
Please attach 90 day report for review of AHI after pressure reduction. TY.

## 2023-10-25 LAB — HM MAMMOGRAPHY

## 2023-11-09 ENCOUNTER — Encounter: Payer: Self-pay | Admitting: Family Medicine

## 2023-11-12 ENCOUNTER — Ambulatory Visit: Payer: Commercial Managed Care - PPO | Admitting: Family Medicine

## 2023-11-12 ENCOUNTER — Encounter: Payer: Self-pay | Admitting: Family Medicine

## 2023-11-12 VITALS — BP 112/78 | HR 70 | Wt 177.4 lb

## 2023-11-12 DIAGNOSIS — F5101 Primary insomnia: Secondary | ICD-10-CM

## 2023-11-12 DIAGNOSIS — M1990 Unspecified osteoarthritis, unspecified site: Secondary | ICD-10-CM

## 2023-11-12 DIAGNOSIS — M1712 Unilateral primary osteoarthritis, left knee: Secondary | ICD-10-CM

## 2023-11-12 DIAGNOSIS — Z79899 Other long term (current) drug therapy: Secondary | ICD-10-CM | POA: Diagnosis not present

## 2023-11-12 DIAGNOSIS — M25562 Pain in left knee: Secondary | ICD-10-CM

## 2023-11-12 DIAGNOSIS — F411 Generalized anxiety disorder: Secondary | ICD-10-CM

## 2023-11-12 DIAGNOSIS — F3342 Major depressive disorder, recurrent, in full remission: Secondary | ICD-10-CM

## 2023-11-12 MED ORDER — ZOLPIDEM TARTRATE 10 MG PO TABS: ORAL_TABLET | ORAL | 1 refills | Status: DC

## 2023-11-12 MED ORDER — TRAMADOL HCL 50 MG PO TABS: 50.0000 mg | ORAL_TABLET | Freq: Four times a day (QID) | ORAL | 1 refills | Status: DC | PRN

## 2023-11-12 MED ORDER — PANTOPRAZOLE SODIUM 40 MG PO TBEC: 40.0000 mg | DELAYED_RELEASE_TABLET | Freq: Two times a day (BID) | ORAL | 1 refills | Status: DC

## 2023-11-12 MED ORDER — TRIAMCINOLONE ACETONIDE 40 MG/ML IJ SUSP
40.0000 mg | Freq: Once | INTRAMUSCULAR | Status: AC
  Administered 2023-11-12: 40 mg via INTRAMUSCULAR

## 2023-11-12 MED ORDER — CYCLOBENZAPRINE HCL 10 MG PO TABS: ORAL_TABLET | ORAL | 1 refills | Status: DC

## 2023-11-12 NOTE — Progress Notes (Signed)
OFFICE VISIT  11/12/2023  CC:  Chief Complaint  Patient presents with   Anxiety    Pt inquiring about steroid injection in knee.     Patient is a 62 y.o. female who presents for 4-month follow-up anxiety and depression, insomnia, and pain syndrome.  INTERIM HX: Feeling very well. She has lost about 75 pounds in the last 7 months since getting on semaglutide.  She is getting this medication through her rheumatologist as part of a study. Says her joints and muscles hardly hurt at all anymore.  She feels great. Says most recent labs that rheumatologist were good.  Had about a month of left knee pain.  Increased walking lately.  History of osteoarthritis in this knee history of good response to steroid injection. Most recent L knee steroid inj was 06/04/23.  She has a history of dysphagia and recent positive Cologuard screening. She saw a gastroenterologist with [facility] and she got a colonoscopy and EGD on 11/02/2023. "HEALTHY APPEARING ILEOCOLONIC ANASSTOMOSIS WITH NORMAL NEOTERMINAL ILEUM  - TWO 9 TO 12 MM POLYPS IN THE TRANSVERSE COLON, REMOVED WITH A COLD SNARE.   RESECTED AND  RETRIEVED.  - ONE 5 MM POLYP IN THE DESCENDING COLON, REMOVED WITH A COLD SNARE.  RESECTED  AND RETRIEVED.  - DIVERTICULOSIS IN THE LEFT COLON.  - INTERNAL HEMORRHOIDS.  - THE EXAMINATION WAS OTHERWISE NORMAL ON DIRECT AND RETROFLEXION VIEWS."  Doing well from a mood and anxiety standpoint as well as sleep standpoint.  PMP AWARE reviewed today: most recent rx for alprazolam was filled 09/17/2023, # 90, rx by me. Most recent Ambien prescription filled 09/17/2023, #90, prescription by me. Most recent tramadol prescription filled 06/02/2023, #90, prescription by me. No red flags.  ROS as above, plus--> no fevers, no CP, no SOB, no wheezing, no cough, no dizziness, no HAs, no rashes, no melena/hematochezia.  No polyuria or polydipsia.   No focal weakness, paresthesias, or tremors.  No acute vision or  hearing abnormalities.  No dysuria or unusual/new urinary urgency or frequency.  No recent changes in lower legs. No n/v/d or abd pain.  No palpitations.     Past Medical History:  Diagnosis Date   Alcoholism (HCC)    Allergic rhinitis    Anxiety and depression    Arthritis of knee    bilat, CPPD+ OA   Asthma    Cervical cancer (HCC)    Choledocholithiasis    COPD (chronic obstructive pulmonary disease) (HCC)    Diverticulosis 2015   Sigmoid colon (noted on colonoscopy)   Elevated LFTs    GERD (gastroesophageal reflux disease)    History of adenomatous polyp of colon 04/02/2014   Recall 5 yrs   History of pyelonephritis 12/2019   Hypercholesterolemia    Inflammatory arthritis    +mild elev ESR, Rheum eval 07/2020: rapid resp to prednisone, felt to have inflamm arth + OA and CCPD bilat knees. Methotrexate INJ q week.   Insomnia    Iron deficiency anemia 04/2021   started iron, hemoccults x 3 NEG   Lumbar spondylosis    Malignant tumor of cervix (HCC) 07/08/2021   Hx of @ age 54. HYST Removal Reason: Cx Cancer   Myalgia, unspecified site    + arthralgias---she is tender EVERYWHERE (summer 2021)->rheum ref   OSA (obstructive sleep apnea) 08/2021   2022 sleep study (Dr. Vickey Huger) severe, with oxygen nadir of 73%. CPAP.   Osteoarthritis, knee    Osteoporosis    Perforation of cecum  perf'd in context of SBO that followed GB surgery; no colon removed.   Ventral hernia     Past Surgical History:  Procedure Laterality Date   BREAST BIOPSY  2010   benign cyst   CHOLECYSTECTOMY     COLONOSCOPY  04/02/2014   2015 adenoma x 2->recall 5 yrs ([facility])   Iliostomy     +  takedown   MOUTH SURGERY     NOSE SURGERY     PARTIAL HYSTERECTOMY  1983   perfor colon repair     SBO after gallbladder surgery   POLYSOMNOGRAPHY  08/2021   Sleep study showed severe OSA, CPAP recommended.   SALPINGOOPHORECTOMY Bilateral 05/10/2014   TONSILLECTOMY     VENTRAL HERNIA REPAIR  2016    open, with mesh    Outpatient Medications Prior to Visit  Medication Sig Dispense Refill   ALPRAZolam (XANAX) 0.5 MG tablet Take 1 tablet (0.5 mg total) by mouth 3 (three) times daily as needed. 90 tablet 5   B-D TB SYRINGE 1CC/27GX1/2" 27G X 1/2" 1 ML MISC Inject into the skin.     busPIRone (BUSPAR) 10 MG tablet Take 1/2 tab once daily x 5 days, then increase to 1/2 tab twice daily x 5 days, then 1 tab in the AM, 1/2 tab in the PM x 5 days, then continue with maintenance 1 tab BID 60 tablet 3   butalbital-acetaminophen-caffeine (FIORICET) 50-325-40 MG tablet Take 1 tablet by mouth every 6 (six) hours as needed for headache. 30 tablet 0   Glycopyrrolate-Formoterol (BEVESPI AEROSPHERE) 9-4.8 MCG/ACT AERO Take 2 puffs by mouth in the morning and at bedtime. 32.1 each 3   leucovorin (WELLCOVORIN) 5 MG tablet Take 5 mg by mouth daily.     meloxicam (MOBIC) 15 MG tablet Take 1 tablet (15 mg total) by mouth daily. 14 tablet 0   montelukast (SINGULAIR) 10 MG tablet TAKE 1 TABLET BY MOUTH EVERYDAY AT BEDTIME 90 tablet 3   pravastatin (PRAVACHOL) 10 MG tablet TAKE 1 TABLET BY MOUTH EVERY DAY 90 tablet 0   SEMAGLUTIDE-WEIGHT MANAGEMENT [place] Inject 2 mg into the skin once a week. Per rheum     methotrexate 250 MG/10ML injection 0.87ml     albuterol (PROVENTIL) (2.5 MG/3ML) 0.083% nebulizer solution INHALE 3 ML BY NEBULIZATION EVERY 6 HOURS AS NEEDED FOR WHEEZING OR SHORTNESS OF BREATH 75 mL 12   fluticasone (FLONASE) 50 MCG/ACT nasal spray INSTILL 1 SPRAY INTO BOTH NOSTRILS DAILY (Patient not taking: Reported on 11/12/2023) 16 mL 2   valACYclovir (VALTREX) 500 MG tablet TAKE 1 TABLET BY MOUTH THEN TAKE 1 TABLET 12 HOURS LATER (Patient not taking: Reported on 11/12/2023) 180 tablet 1   cyclobenzaprine (FLEXERIL) 10 MG tablet TAKE ONE TABLET BY MOUTH 2 TIMES A DAY AS NEEDED FOR MUSCLE SPASMS. 60 tablet 1   erythromycin ophthalmic ointment Place 1 Application into the right eye 3 (three) times daily. (Patient  not taking: Reported on 09/09/2023) 3.5 g 0   FERROUS SULFATE PO Take 325 mg by mouth daily. (Patient not taking: Reported on 11/12/2023)     pantoprazole (PROTONIX) 40 MG tablet Take 1 tablet (40 mg total) by mouth 2 (two) times daily. 180 tablet 1   traMADol (ULTRAM) 50 MG tablet Take 1 tablet (50 mg total) by mouth 4 (four) times daily as needed. 90 tablet 1   zolpidem (AMBIEN) 10 MG tablet TAKE 1 TABLET BY MOUTH EVERY DAY AT BEDTIME AS NEEDED 90 tablet 1   No  facility-administered medications prior to visit.    Allergies  Allergen Reactions   Penicillins Anaphylaxis and Rash   Sulfa Antibiotics Anaphylaxis and Rash    Review of Systems As per HPI  PE:    11/12/2023    8:37 AM 09/10/2023   10:24 AM 09/09/2023    8:41 AM  Vitals with BMI  Height  5\' 4"  5\' 4"   Weight 177 lbs 6 oz 183 lbs 3 oz 183 lbs 8 oz  BMI  31.43 31.48  Systolic 112 102 478  Diastolic 78 71 81  Pulse 70 61 74     Physical Exam  Gen: Alert, well appearing.  Patient is oriented to person, place, time, and situation. AFFECT: pleasant, lucid thought and speech. Left knee: No swelling, erythema, or warmth.  She has mild tenderness to palpation along the medial joint line.  No crepitus.  No ligamentous instability.  LABS:  Last CBC Lab Results  Component Value Date   WBC 5.2 06/02/2023   HGB 14.7 06/02/2023   HCT 44.1 06/02/2023   MCV 96.4 06/02/2023   MCH 32.9 11/20/2022   RDW 15.2 06/02/2023   PLT 326.0 06/02/2023   Last metabolic panel Lab Results  Component Value Date   GLUCOSE 85 06/02/2023   NA 136 06/02/2023   K 4.2 06/02/2023   CL 100 06/02/2023   CO2 25 06/02/2023   BUN 14 06/02/2023   CREATININE 0.86 06/02/2023   GFR 72.38 06/02/2023   CALCIUM 10.3 06/02/2023   PROT 7.0 06/02/2023   ALBUMIN 4.6 06/02/2023   BILITOT 0.4 06/02/2023   ALKPHOS 82 06/02/2023   AST 26 06/02/2023   ALT 42 (H) 06/02/2023   Last lipids Lab Results  Component Value Date   CHOL 158 06/02/2023    HDL 58.80 06/02/2023   LDLCALC 77 06/02/2023   LDLDIRECT 100.0 04/25/2021   TRIG 113.0 06/02/2023   CHOLHDL 3 06/02/2023   Last hemoglobin A1c Lab Results  Component Value Date   HGBA1C 5.6 11/25/2022   HGBA1C 5.6 11/25/2022   HGBA1C 5.6 (A) 11/25/2022   HGBA1C 5.6 11/25/2022   Last thyroid functions Lab Results  Component Value Date   TSH 2.05 04/25/2021   IMPRESSION AND PLAN:  #1 recurrent major depressive disorder, generalized anxiety disorder, insomnia. She is doing well. Continue BuSpar 10 mg twice a day, Xanax 0.5 mg 3 times daily, and Ambien 10 mg nightly.  #2 arthritis of multiple etiologies: Followed by rheumatology and felt to have a component of inflammatory arthritis as well as CPPD and osteoarthritis. Doing much better since she has had significant weight loss with semaglutide--> prescribed by her rheumatologist. She is off of methotrexate.  #3 positive Cologuard. She had colonoscopy 11/02/2023, 3 adenomas detected.  No high-grade dysplasia.  4.  Acute left knee pain, osteoarthritis. Has responded well to steroid injection in the past, most recently about 5 months ago.  Patient desires injection again today.  Ultrasound-guided injection is preferred based on studies that show increased duration, increased effect, greater accuracy, decreased procedural pain, increased response rate, and decreased cost with ultrasound-guided versus blind injection. Procedure: Real-time ultrasound guided injection of left knee suprapatellar recess. Device: GE [employer] informed consent obtained.  Timeout conducted.  No overlying erythema, induration, or other signs of local infection. After sterile prep with Betadine, injected a mixture of 40 mg Kenalog and 3 mL of 1% plain lidocaine.  Injectate seen filling suprapatellar recess. Patient tolerated the procedure well.  No immediate complications.  Post-injection  care discussed. Advised to call if fever/chills, erythema,  drainage, or persistent bleeding.  Impression: Technically successful ultrasound-guided injection.  An After Visit Summary was printed and given to the patient.  FOLLOW UP: Return in about 6 months (around 05/12/2024) for routine chronic illness f/u. Next CPE 05/2024 Signed:  Santiago Bumpers, MD           11/12/2023

## 2023-11-25 ENCOUNTER — Encounter: Payer: Self-pay | Admitting: Emergency Medicine

## 2023-12-02 ENCOUNTER — Ambulatory Visit: Payer: Commercial Managed Care - PPO | Admitting: Emergency Medicine

## 2023-12-09 ENCOUNTER — Other Ambulatory Visit: Payer: Self-pay | Admitting: Family Medicine

## 2023-12-09 ENCOUNTER — Encounter: Payer: Self-pay | Admitting: Family Medicine

## 2023-12-12 ENCOUNTER — Other Ambulatory Visit: Payer: Self-pay | Admitting: Family Medicine

## 2023-12-12 DIAGNOSIS — J441 Chronic obstructive pulmonary disease with (acute) exacerbation: Secondary | ICD-10-CM

## 2023-12-21 ENCOUNTER — Encounter: Payer: Self-pay | Admitting: Family Medicine

## 2024-01-18 ENCOUNTER — Other Ambulatory Visit: Payer: Self-pay | Admitting: Urgent Care

## 2024-01-18 ENCOUNTER — Encounter: Payer: Self-pay | Admitting: Family Medicine

## 2024-01-18 DIAGNOSIS — F439 Reaction to severe stress, unspecified: Secondary | ICD-10-CM

## 2024-01-18 DIAGNOSIS — G44211 Episodic tension-type headache, intractable: Secondary | ICD-10-CM

## 2024-01-18 NOTE — Telephone Encounter (Signed)
 Last refill 09/10/23 (30,0)  Please fill, if appropriate.

## 2024-01-19 ENCOUNTER — Other Ambulatory Visit: Payer: Self-pay | Admitting: Family Medicine

## 2024-01-19 MED ORDER — BUTALBITAL-APAP-CAFFEINE 50-325-40 MG PO TABS: 1.0000 | ORAL_TABLET | Freq: Four times a day (QID) | ORAL | 0 refills | Status: DC | PRN

## 2024-01-20 MED ORDER — MELOXICAM 15 MG PO TABS: 15.0000 mg | ORAL_TABLET | Freq: Every day | ORAL | 2 refills | Status: AC

## 2024-01-20 MED ORDER — BUSPIRONE HCL 10 MG PO TABS: ORAL_TABLET | ORAL | 3 refills | Status: DC

## 2024-01-20 NOTE — Telephone Encounter (Signed)
 Please fill medications, if appropriate.

## 2024-01-20 NOTE — Telephone Encounter (Signed)
 Okay, prescriptions for the BuSpar and the butalbital have been sent now.

## 2024-01-20 NOTE — Telephone Encounter (Signed)
 No further action needed.

## 2024-02-10 ENCOUNTER — Other Ambulatory Visit: Payer: Self-pay | Admitting: Family Medicine

## 2024-02-12 ENCOUNTER — Other Ambulatory Visit: Payer: Self-pay | Admitting: Family Medicine

## 2024-02-22 ENCOUNTER — Other Ambulatory Visit: Payer: Self-pay | Admitting: Family Medicine

## 2024-04-12 ENCOUNTER — Other Ambulatory Visit: Payer: Self-pay | Admitting: Family Medicine

## 2024-04-12 DIAGNOSIS — J441 Chronic obstructive pulmonary disease with (acute) exacerbation: Secondary | ICD-10-CM

## 2024-04-12 DIAGNOSIS — G44211 Episodic tension-type headache, intractable: Secondary | ICD-10-CM

## 2024-04-23 ENCOUNTER — Other Ambulatory Visit: Payer: Self-pay | Admitting: Family Medicine

## 2024-04-24 NOTE — Telephone Encounter (Signed)
Pt has appt 6/4.

## 2024-04-26 ENCOUNTER — Encounter: Payer: Self-pay | Admitting: Family Medicine

## 2024-04-26 ENCOUNTER — Ambulatory Visit: Payer: Self-pay | Admitting: Family Medicine

## 2024-04-26 ENCOUNTER — Ambulatory Visit (INDEPENDENT_AMBULATORY_CARE_PROVIDER_SITE_OTHER): Payer: Commercial Managed Care - PPO | Admitting: Family Medicine

## 2024-04-26 VITALS — BP 92/62 | HR 65 | Ht 64.0 in | Wt 165.6 lb

## 2024-04-26 DIAGNOSIS — M7918 Myalgia, other site: Secondary | ICD-10-CM

## 2024-04-26 DIAGNOSIS — F3342 Major depressive disorder, recurrent, in full remission: Secondary | ICD-10-CM

## 2024-04-26 DIAGNOSIS — F411 Generalized anxiety disorder: Secondary | ICD-10-CM | POA: Diagnosis not present

## 2024-04-26 DIAGNOSIS — L659 Nonscarring hair loss, unspecified: Secondary | ICD-10-CM

## 2024-04-26 DIAGNOSIS — E7800 Pure hypercholesterolemia, unspecified: Secondary | ICD-10-CM | POA: Diagnosis not present

## 2024-04-26 DIAGNOSIS — G44211 Episodic tension-type headache, intractable: Secondary | ICD-10-CM

## 2024-04-26 LAB — LIPID PANEL
Cholesterol: 158 mg/dL (ref 0–200)
HDL: 55.5 mg/dL (ref >39.00)
LDL Cholesterol: 75 mg/dL (ref 0–99)
NonHDL: 102.56
Total CHOL/HDL Ratio: 3
Triglycerides: 140 mg/dL (ref 0.0–149.0)
VLDL: 28 mg/dL (ref 0.0–40.0)

## 2024-04-26 LAB — COMPREHENSIVE METABOLIC PANEL WITH GFR
ALT: 13 U/L (ref 0–35)
AST: 13 U/L (ref 0–37)
Albumin: 4 g/dL (ref 3.5–5.2)
Alkaline Phosphatase: 67 U/L (ref 39–117)
BUN: 9 mg/dL (ref 6–23)
CO2: 31 meq/L (ref 19–32)
Calcium: 9.4 mg/dL (ref 8.4–10.5)
Chloride: 104 meq/L (ref 96–112)
Creatinine, Ser: 0.82 mg/dL (ref 0.40–1.20)
GFR: 76.16 mL/min (ref >60.00)
Glucose, Bld: 75 mg/dL (ref 70–99)
Potassium: 4.1 meq/L (ref 3.5–5.1)
Sodium: 141 meq/L (ref 135–145)
Total Bilirubin: 0.3 mg/dL (ref 0.2–1.2)
Total Protein: 6.4 g/dL (ref 6.0–8.3)

## 2024-04-26 MED ORDER — SPIRONOLACTONE 25 MG PO TABS: 12.5000 mg | ORAL_TABLET | Freq: Every day | ORAL | 5 refills | Status: DC

## 2024-04-26 MED ORDER — PANTOPRAZOLE SODIUM 40 MG PO TBEC: 40.0000 mg | DELAYED_RELEASE_TABLET | Freq: Two times a day (BID) | ORAL | 1 refills | Status: DC

## 2024-04-26 MED ORDER — PRAVASTATIN SODIUM 10 MG PO TABS: 10.0000 mg | ORAL_TABLET | Freq: Every day | ORAL | 0 refills | Status: DC

## 2024-04-26 NOTE — Progress Notes (Signed)
 OFFICE VISIT  04/26/2024  CC: No chief complaint on file.   Patient is a 63 y.o. female who presents for 28-month follow-up anxiety, depression, insomnia, and hypercholesterolemia. A/P as of last visit: "#1 recurrent major depressive disorder, generalized anxiety disorder, insomnia. She is doing well. Continue BuSpar  10 mg twice a day, Xanax  0.5 mg 3 times daily, and Ambien  10 mg nightly.   #2 arthritis of multiple etiologies: Followed by rheumatology and felt to have a component of inflammatory arthritis as well as CPPD and osteoarthritis. Doing much better since she has had significant weight loss with semaglutide --> prescribed by her rheumatologist. She is off of methotrexate.   #3 positive Cologuard. She had colonoscopy 11/02/2023, 3 adenomas detected.  No high-grade dysplasia.   4.  Acute left knee pain, osteoarthritis. Has responded well to steroid injection in the past, most recently about 5 months ago.  Patient desires injection again today."  INTERIM HX: Feeling well.  She is losing weight on Wegovy  that is prescribed by her rheumatologist. She finds that most of her muscular skeletal pain has gone away since losing weight.  She still does have some aching in both shoulders that she occasionally will take a tramadol  for.  Buspirone  and alprazolam  helping significantly for her chronic anxiety.  She requests spironolactone to help with hair loss and thinning that she feels like is coming with menopause.  Says blood pressure is typically low normal.  PMP AWARE reviewed today: most recent rx for alprazolam  was filled 04/12/2024, # 90, rx by me. Most recent Ambien  prescription was filled 03/12/24, #90, prescription by me. Most recent tramadol  prescription was filled 11/12/2023, #90, prescription by me. No red flags.  ROS as above, plus--> no fevers, no CP, no SOB, no wheezing, no cough, no dizziness, no HAs, no rashes, no melena/hematochezia.  No polyuria or polydipsia.  No  myalgias or arthralgias.  No focal weakness, paresthesias, or tremors.  No acute vision or hearing abnormalities.  No dysuria or unusual/new urinary urgency or frequency.  No recent changes in lower legs. No n/v/d or abd pain.  No palpitations.     Past Medical History:  Diagnosis Date   Alcoholism (HCC)    Allergic rhinitis    Anxiety and depression    Arthritis of knee    bilat, CPPD+ OA   Asthma    Cervical cancer (HCC)    Choledocholithiasis    Colon cancer screening    positive cologuard 09/2023-->colonoscopy 11/02/23 adenoma x 3.   COPD (chronic obstructive pulmonary disease) (HCC)    Diverticulosis 2015   Sigmoid colon (noted on colonoscopy)   Elevated LFTs    GERD (gastroesophageal reflux disease)    History of adenomatous polyp of colon 04/02/2014   Recall 5 yrs   History of pyelonephritis 12/2019   Hypercholesterolemia    Inflammatory arthritis    +mild elev ESR, Rheum eval 07/2020: rapid resp to prednisone , felt to have inflamm arth + OA and CCPD bilat knees. Methotrexate INJ q week.   Insomnia    Iron deficiency anemia 04/2021   started iron, hemoccults x 3 NEG   Lumbar spondylosis    Malignant tumor of cervix (HCC) 07/08/2021   Hx of @ age 52. HYST Removal Reason: Cx Cancer   Myalgia, unspecified site    + arthralgias---she is tender EVERYWHERE (summer 2021)->rheum ref   OSA (obstructive sleep apnea) 08/2021   2022 sleep study (Dr. Albertina Hugger) severe, with oxygen nadir of 73%. CPAP.   Osteoarthritis, knee  Osteoporosis    Perforation of cecum    perf'd in context of SBO that followed GB surgery; no colon removed.   Ventral hernia     Past Surgical History:  Procedure Laterality Date   BREAST BIOPSY  2010   benign cyst   CHOLECYSTECTOMY     COLONOSCOPY  04/02/2014   2015 adenoma x 2->recall 5 yrs ([facility]).  10/2023 adenoma x 3 ([facility])   ESOPHAGOGASTRODUODENOSCOPY     11/02/23 reflux esophagitis, no Barrett's   Iliostomy     +  takedown   MOUTH  SURGERY     NOSE SURGERY     PARTIAL HYSTERECTOMY  1983   perfor colon repair     SBO after gallbladder surgery   POLYSOMNOGRAPHY  08/2021   Sleep study showed severe OSA, CPAP recommended.   SALPINGOOPHORECTOMY Bilateral 05/10/2014   TONSILLECTOMY     VENTRAL HERNIA REPAIR  2016   open, with mesh    Outpatient Medications Prior to Visit  Medication Sig Dispense Refill   ALPRAZolam  (XANAX ) 0.5 MG tablet TAKE 1 TABLET BY MOUTH 3 TIMES DAILY AS NEEDED. 90 tablet 5   B-D TB SYRINGE 1CC/27GX1/2" 27G X 1/2" 1 ML MISC Inject into the skin.     busPIRone  (BUSPAR ) 10 MG tablet 1 tab po bid 60 tablet 3   butalbital -acetaminophen-caffeine  (FIORICET) 50-325-40 MG tablet TAKE 1 TABLET BY MOUTH EVERY 6 HOURS AS NEEDED FOR HEADACHE. 30 tablet 1   cyclobenzaprine  (FLEXERIL ) 10 MG tablet TAKE ONE TABLET BY MOUTH 2 TIMES A DAY AS NEEDED FOR MUSCLE SPASMS. 60 tablet 1   Glycopyrrolate -Formoterol  (BEVESPI  AEROSPHERE) 9-4.8 MCG/ACT AERO Take 2 puffs by mouth in the morning and at bedtime. 32.1 each 3   meloxicam  (MOBIC ) 15 MG tablet Take 1 tablet (15 mg total) by mouth daily. 30 tablet 2   montelukast  (SINGULAIR ) 10 MG tablet TAKE 1 TABLET BY MOUTH EVERYDAY AT BEDTIME 90 tablet 1   SEMAGLUTIDE -WEIGHT MANAGEMENT [place] Inject 2 mg into the skin once a week. Per rheum     traMADol  (ULTRAM ) 50 MG tablet Take 1 tablet (50 mg total) by mouth 4 (four) times daily as needed. 90 tablet 1   zolpidem  (AMBIEN ) 10 MG tablet TAKE 1 TABLET BY MOUTH EVERY DAY AT BEDTIME AS NEEDED 90 tablet 1   leucovorin (WELLCOVORIN) 5 MG tablet Take 5 mg by mouth daily.     valACYclovir  (VALTREX ) 500 MG tablet TAKE 1 TABLET BY MOUTH THEN TAKE 1 TABLET 12 HOURS LATER (Patient not taking: Reported on 04/26/2024) 180 tablet 1   fluticasone  (FLONASE ) 50 MCG/ACT nasal spray INSTILL 1 SPRAY INTO BOTH NOSTRILS DAILY (Patient not taking: Reported on 09/09/2023) 16 mL 2   pantoprazole  (PROTONIX ) 40 MG tablet Take 1 tablet (40 mg total) by mouth 2  (two) times daily. 180 tablet 1   pravastatin  (PRAVACHOL ) 10 MG tablet TAKE 1 TABLET BY MOUTH EVERY DAY 90 tablet 0   No facility-administered medications prior to visit.    Allergies  Allergen Reactions   Penicillins Anaphylaxis and Rash   Sulfa Antibiotics Anaphylaxis and Rash    Review of Systems As per HPI  PE:    04/26/2024    8:05 AM 11/12/2023    8:37 AM 09/10/2023   10:24 AM  Vitals with BMI  Height 5\' 4"   5\' 4"   Weight 165 lbs 10 oz 177 lbs 6 oz 183 lbs 3 oz  BMI 28.41  31.43  Systolic 92 112 102  Diastolic  62 78 71  Pulse 65 70 61     Physical Exam  Gen: Alert, well appearing.  Patient is oriented to person, place, time, and situation. AFFECT: pleasant, lucid thought and speech. CV: RRR, distant S1 and S2. no m/r/g.   LUNGS: CTA bilat, nonlabored resps, good aeration in all lung fields. EXT: no clubbing or cyanosis.  no edema.    LABS:  Last CBC Lab Results  Component Value Date   WBC 6.6 09/09/2023   HGB 15.5 09/09/2023   HCT 47 (A) 09/09/2023   MCV 96.4 06/02/2023   MCH 32.9 11/20/2022   RDW 15.2 06/02/2023   PLT 344 09/09/2023   Last metabolic panel Lab Results  Component Value Date   GLUCOSE 85 06/02/2023   NA 141 09/09/2023   K 4.2 09/09/2023   CL 101 09/09/2023   CO2 21 09/09/2023   BUN 17 09/09/2023   CREATININE 0.8 09/09/2023   EGFR 81 09/09/2023   CALCIUM 10.2 09/09/2023   PROT 7.0 06/02/2023   ALBUMIN 4.6 09/09/2023   BILITOT 0.4 06/02/2023   ALKPHOS 102 09/09/2023   AST 20 09/09/2023   ALT 27 09/09/2023   Last lipids Lab Results  Component Value Date   CHOL 158 06/02/2023   HDL 58.80 06/02/2023   LDLCALC 77 06/02/2023   LDLDIRECT 100.0 04/25/2021   TRIG 113.0 06/02/2023   CHOLHDL 3 06/02/2023   Last hemoglobin A1c Lab Results  Component Value Date   HGBA1C 5.6 11/25/2022   HGBA1C 5.6 11/25/2022   HGBA1C 5.6 (A) 11/25/2022   HGBA1C 5.6 11/25/2022   Last thyroid functions Lab Results  Component Value Date    TSH 2.05 04/25/2021   IMPRESSION AND PLAN:  #1 hypercholesterolemia. Doing well on pravastatin  10 mg a day. Monitor lipid panel and hepatic panel today.  2.  Menopausal hair loss.  Start trial of 12.5 mg spironolactone daily. Therapeutic expectations and side effect profile of medication discussed today.  Patient's questions answered. Basic metabolic panel today.  3.  Recurrent major depressive disorder, in remission.  GAD, doing well on BuSpar  10 mg twice a day and alprazolam  0.5 mg 3 times a day.  4. arthritis of multiple etiologies: Followed by rheumatology and felt to have a component of inflammatory arthritis as well as CPPD and osteoarthritis. Doing much better since she has had significant weight loss with semaglutide --> prescribed by her rheumatologist. She has been off of methotrexate and leucovorin.  An After Visit Summary was printed and given to the patient.  FOLLOW UP: Return in about 6 months (around 10/26/2024) for annual CPE (fasting). Next CPE after 06/01/2024 Signed:  Arletha Lady, MD           04/26/2024

## 2024-05-13 ENCOUNTER — Other Ambulatory Visit: Payer: Self-pay | Admitting: Family Medicine

## 2024-05-15 ENCOUNTER — Encounter: Payer: Self-pay | Admitting: Family Medicine

## 2024-05-15 MED ORDER — PRAVASTATIN SODIUM 10 MG PO TABS: 10.0000 mg | ORAL_TABLET | Freq: Every day | ORAL | 1 refills | Status: DC

## 2024-05-15 MED ORDER — BUSPIRONE HCL 10 MG PO TABS: ORAL_TABLET | ORAL | 1 refills | Status: DC

## 2024-05-25 ENCOUNTER — Encounter: Payer: Commercial Managed Care - PPO | Admitting: Family Medicine

## 2024-06-10 ENCOUNTER — Other Ambulatory Visit: Payer: Self-pay | Admitting: Family Medicine

## 2024-06-12 ENCOUNTER — Encounter: Payer: Self-pay | Admitting: Family Medicine

## 2024-06-12 NOTE — Telephone Encounter (Signed)
Okay, prescriptions sent

## 2024-06-30 ENCOUNTER — Encounter: Payer: Self-pay | Admitting: Family Medicine

## 2024-06-30 ENCOUNTER — Ambulatory Visit (INDEPENDENT_AMBULATORY_CARE_PROVIDER_SITE_OTHER): Admitting: Family Medicine

## 2024-06-30 VITALS — BP 110/73 | HR 72 | Temp 97.4°F | Ht 64.0 in | Wt 165.6 lb

## 2024-06-30 DIAGNOSIS — B029 Zoster without complications: Secondary | ICD-10-CM

## 2024-06-30 MED ORDER — VALACYCLOVIR HCL 1 G PO TABS: 1000.0000 mg | ORAL_TABLET | Freq: Three times a day (TID) | ORAL | 0 refills | Status: AC

## 2024-06-30 MED ORDER — GABAPENTIN 100 MG PO CAPS: ORAL_CAPSULE | ORAL | 0 refills | Status: DC

## 2024-06-30 NOTE — Progress Notes (Addendum)
 OFFICE VISIT  06/30/2024  CC:  Chief Complaint  Patient presents with   Back Pain    Lower, R thigh and hip pain; burning and red lumps around the area x 1 week.    Patient is a 63 y.o. female who presents for pain.  HPI: Onset 7 days ago, right low back burning/tingling sensation.  Saw some red bumps come up on that area as well as over the lateral buttock and thigh and into the right lower abdomen.  The last couple of days has felt the similar pain in the right inframammary area but she does not have any skin lesion there. No preceding overuse or trauma or acute straining injury. No recent viral syndrome or excessive stress.  She notes that even her close touching the involved areas increases her pain. She took a tramadol  yesterday and it did not help.  Review of systems: No fever.  No URI symptoms, no sore throat, no headaches, no cough.  No nausea, vomiting, or diarrhea. She has chronic diffuse low back pain.  Past Medical History:  Diagnosis Date   Alcoholism (HCC)    Allergic rhinitis    Anxiety and depression    Arthritis of knee    bilat, CPPD+ OA   Asthma    Cervical cancer (HCC)    Choledocholithiasis    Colon cancer screening    positive cologuard 09/2023-->colonoscopy 11/02/23 adenoma x 3.   COPD (chronic obstructive pulmonary disease) (HCC)    Diverticulosis 2015   Sigmoid colon (noted on colonoscopy)   Elevated LFTs    GERD (gastroesophageal reflux disease)    History of adenomatous polyp of colon 04/02/2014   Recall 5 yrs   History of pyelonephritis 12/2019   Hypercholesterolemia    Inflammatory arthritis    +mild elev ESR, Rheum eval 07/2020: rapid resp to prednisone , felt to have inflamm arth + OA and CCPD bilat knees. Methotrexate INJ q week.   Insomnia    Iron deficiency anemia 04/2021   started iron, hemoccults x 3 NEG   Lumbar spondylosis    Malignant tumor of cervix (HCC) 07/08/2021   Hx of @ age 37. HYST Removal Reason: Cx Cancer   Myalgia,  unspecified site    + arthralgias---she is tender EVERYWHERE (summer 2021)->rheum ref   OSA (obstructive sleep apnea) 08/2021   2022 sleep study (Dr. Chalice) severe, with oxygen nadir of 73%. CPAP.   Osteoarthritis, knee    Osteoporosis    Perforation of cecum    perf'd in context of SBO that followed GB surgery; no colon removed.   Ventral hernia     Past Surgical History:  Procedure Laterality Date   BREAST BIOPSY  2010   benign cyst   CHOLECYSTECTOMY     COLONOSCOPY  04/02/2014   2015 adenoma x 2->recall 5 yrs ([facility]).  10/2023 adenoma x 3 ([facility])   ESOPHAGOGASTRODUODENOSCOPY     11/02/23 reflux esophagitis, no Barrett's   Iliostomy     +  takedown   MOUTH SURGERY     NOSE SURGERY     PARTIAL HYSTERECTOMY  1983   perfor colon repair     SBO after gallbladder surgery   POLYSOMNOGRAPHY  08/2021   Sleep study showed severe OSA, CPAP recommended.   SALPINGOOPHORECTOMY Bilateral 05/10/2014   TONSILLECTOMY     VENTRAL HERNIA REPAIR  2016   open, with mesh    Outpatient Medications Prior to Visit  Medication Sig Dispense Refill   ALPRAZolam  (XANAX ) 0.5  MG tablet TAKE 1 TABLET BY MOUTH THREE TIMES A DAY AS NEEDED 90 tablet 4   B-D TB SYRINGE 1CC/27GX1/2 27G X 1/2 1 ML MISC Inject into the skin.     busPIRone  (BUSPAR ) 10 MG tablet 1 tab po bid 180 tablet 1   butalbital -acetaminophen-caffeine  (FIORICET) 50-325-40 MG tablet TAKE 1 TABLET BY MOUTH EVERY 6 HOURS AS NEEDED FOR HEADACHE. 30 tablet 1   cyclobenzaprine  (FLEXERIL ) 10 MG tablet TAKE ONE TABLET BY MOUTH 2 TIMES A DAY AS NEEDED FOR MUSCLE SPASMS. 60 tablet 1   Glycopyrrolate -Formoterol  (BEVESPI  AEROSPHERE) 9-4.8 MCG/ACT AERO Take 2 puffs by mouth in the morning and at bedtime. 32.1 each 3   meloxicam  (MOBIC ) 15 MG tablet Take 1 tablet (15 mg total) by mouth daily. 30 tablet 2   montelukast  (SINGULAIR ) 10 MG tablet TAKE 1 TABLET BY MOUTH EVERYDAY AT BEDTIME 90 tablet 1   pantoprazole  (PROTONIX ) 40 MG tablet Take 1  tablet (40 mg total) by mouth 2 (two) times daily. 180 tablet 1   pravastatin  (PRAVACHOL ) 10 MG tablet Take 1 tablet (10 mg total) by mouth daily. 90 tablet 1   SEMAGLUTIDE -WEIGHT MANAGEMENT Kramer Inject 2 mg into the skin once a week. Per rheum     spironolactone  (ALDACTONE ) 25 MG tablet Take 0.5 tablets (12.5 mg total) by mouth daily. (Patient taking differently: Take 12.5 mg by mouth 3 (three) times a week.) 15 tablet 5   traMADol  (ULTRAM ) 50 MG tablet Take 1 tablet (50 mg total) by mouth 4 (four) times daily as needed. 90 tablet 1   zolpidem  (AMBIEN ) 10 MG tablet TAKE 1 TABLET BY MOUTH EVERY DAY AT BEDTIME AS NEEDED 90 tablet 1   valACYclovir  (VALTREX ) 500 MG tablet TAKE 1 TABLET BY MOUTH THEN TAKE 1 TABLET 12 HOURS LATER (Patient not taking: Reported on 06/30/2024) 180 tablet 1   No facility-administered medications prior to visit.    Allergies  Allergen Reactions   Penicillins Anaphylaxis and Rash   Sulfa Antibiotics Anaphylaxis and Rash    Review of Systems  As per HPI  PE:    06/30/2024    3:50 PM 04/26/2024    8:05 AM 11/12/2023    8:37 AM  Vitals with BMI  Height 5' 4 5' 4   Weight 165 lbs 10 oz 165 lbs 10 oz 177 lbs 6 oz  BMI 28.41 28.41   Systolic 110 92 112  Diastolic 73 62 78  Pulse 72 65 70     Physical Exam Exam chaperoned by Jesusa Hahn, CMA  Gen: Alert, well appearing.  Patient is oriented to person, place, time, and situation. Right iliac crest region and right lateral hip and right lower abdomen with very small erythematous papulovesicles.  There is a total of about 10 lesions. Range of motion of hip fully intact and does not elicit pain. No lesions on the right chest wall/inframammary region.  LABS:  Last CBC Lab Results  Component Value Date   WBC 6.6 09/09/2023   HGB 15.5 09/09/2023   HCT 47 (A) 09/09/2023   MCV 96.4 06/02/2023   MCH 32.9 11/20/2022   RDW 15.2 06/02/2023   PLT 344 09/09/2023   Last metabolic panel Lab Results  Component  Value Date   GLUCOSE 75 04/26/2024   NA 141 04/26/2024   K 4.1 04/26/2024   CL 104 04/26/2024   CO2 31 04/26/2024   BUN 9 04/26/2024   CREATININE 0.82 04/26/2024   GFR 76.16 04/26/2024   CALCIUM 9.4  04/26/2024   PROT 6.4 04/26/2024   ALBUMIN 4.0 04/26/2024   BILITOT 0.3 04/26/2024   ALKPHOS 67 04/26/2024   AST 13 04/26/2024   ALT 13 04/26/2024   IMPRESSION AND PLAN:  Herpes zoster, atypical. She has had Shingrix vaccine a few years ago. Valtrex  1 g 3 times daily x 7 days. Gabapentin  100 mg 3 times daily.  Increase gradually over the next few days to 3 capsules 3 times daily.  An After Visit Summary was printed and given to the patient.  FOLLOW UP: No follow-ups on file.  Signed:  Gerlene Hockey, MD           06/30/2024

## 2024-07-03 ENCOUNTER — Encounter: Payer: Self-pay | Admitting: Family Medicine

## 2024-07-04 MED ORDER — GABAPENTIN 300 MG PO CAPS
ORAL_CAPSULE | ORAL | 1 refills | Status: DC
Start: 2024-07-04 — End: 2024-07-14

## 2024-07-04 NOTE — Telephone Encounter (Signed)
 No further action needed.

## 2024-07-04 NOTE — Telephone Encounter (Signed)
 Ok to take up to 600 mg three times per day. I sent in new rx for the 300mg  caps today.

## 2024-07-06 ENCOUNTER — Encounter: Payer: Self-pay | Admitting: Family Medicine

## 2024-07-06 NOTE — Telephone Encounter (Signed)
 According to my documentation in EMR she quit smoking about 20 yrs ago. Pls confirm with pt. If she quit at least 15 years ago then she is at average risk of lung cancer and does not meet criteria for screening. If she quit less than 15 yrs ago then pls order lung cancer screening CT for the imaging location of her choice.

## 2024-07-07 NOTE — Telephone Encounter (Signed)
 Pt confirmed she has stopped smoking 20 years ago

## 2024-07-10 ENCOUNTER — Encounter: Payer: Self-pay | Admitting: Family Medicine

## 2024-07-10 NOTE — Telephone Encounter (Signed)
 Lung cancer screening is not needed for her.

## 2024-07-11 MED ORDER — PREDNISONE 10 MG PO TABS
ORAL_TABLET | ORAL | 0 refills | Status: DC
Start: 2024-07-11 — End: 2024-07-14

## 2024-07-11 NOTE — Telephone Encounter (Signed)
 OK to take 600 mg gabapentin  three times a day.  I sent in prednisone  rx to take -->instructions on bottle to taper down over a 15d period.

## 2024-07-11 NOTE — Telephone Encounter (Signed)
 Okay to keep contacts in

## 2024-07-13 NOTE — Telephone Encounter (Signed)
 Pt on schedule tomorrow at 3

## 2024-07-14 ENCOUNTER — Telehealth: Payer: Self-pay

## 2024-07-14 ENCOUNTER — Other Ambulatory Visit: Payer: Self-pay | Admitting: Family Medicine

## 2024-07-14 ENCOUNTER — Ambulatory Visit: Admitting: Family Medicine

## 2024-07-14 ENCOUNTER — Encounter: Payer: Self-pay | Admitting: Family Medicine

## 2024-07-14 VITALS — BP 129/78 | HR 72 | Temp 97.6°F | Ht 64.0 in | Wt 162.4 lb

## 2024-07-14 DIAGNOSIS — R202 Paresthesia of skin: Secondary | ICD-10-CM | POA: Diagnosis not present

## 2024-07-14 DIAGNOSIS — R21 Rash and other nonspecific skin eruption: Secondary | ICD-10-CM

## 2024-07-14 DIAGNOSIS — R208 Other disturbances of skin sensation: Secondary | ICD-10-CM

## 2024-07-14 DIAGNOSIS — T50905A Adverse effect of unspecified drugs, medicaments and biological substances, initial encounter: Secondary | ICD-10-CM

## 2024-07-14 MED ORDER — PREDNISONE 10 MG PO TABS: ORAL_TABLET | ORAL | 0 refills | Status: DC

## 2024-07-14 NOTE — Telephone Encounter (Signed)
 Reason for CRM: Patient stated that the doctor told her to stop taking gabapentin  and she doesn't have any pain medicines. She wants to know what is going to be given because of the shingles and pains she's having over all. She also needs to make sure her medication for predniSONE  (DELTASONE ) 10 MG tablet was called in

## 2024-07-14 NOTE — Patient Instructions (Addendum)
 Stop EpiPen. Start generic Claritin 10 mg twice a day. Finish your current prednisone  tabs by taking 5 every day. After that, start new prescription for prednisone  today with tapering instructions (sent to pharmacy today).

## 2024-07-14 NOTE — Telephone Encounter (Signed)
 Spoke with PCP regarding medication concern, per provider pt can take 2 Tramadol  every 8 hours. She voiced understanding and does not need a refill currently.

## 2024-07-14 NOTE — Progress Notes (Signed)
 OFFICE VISIT  07/14/2024  CC:  Chief Complaint  Patient presents with   Follow-up    shingles    Patient is a 63 y.o. female who presents for rash and pain.  HPI: I saw her 14 days ago for painful rash that I diagnosed as shingles and prescribed Valtrex  and started her on gabapentin .  We have increased her gabapentin  dosing over the days since that visit. On 07/11/2024 I added a steroid taper.  Since I last saw her she developed a rash on her face in a malar butterfly distribution.  Face is uncomfortable somewhat itchy.  She feels like her eyes are involved but there is not been any eye redness or drainage. No real facial edema (she presented some pictures today).  No hives.  No oral lesions. Now she feels a bandlike burning sensation across the breast area bilaterally, especially the right nipple.  She does not have a rash.  This bandlike burning extends around both sides and across her back.  She still has a few of the pink spots that were in the right lower abdomen/groin region when I saw her at last visit.  These have dried on trunk. She has 1 pink spot on the distal aspect of the left thigh.  She was scratching at it this morning and the surrounding 10 cm or so turned red: She shows me a picture.  Now the redness has regressed completely. She denies muscle aches or joint aches or joint swelling. No fevers.  No focal weakness, no tremor, no change in bowel or bladder function. No headaches.  She did start the prednisone , has taken 5 of the 10 mg tabs daily for the last 3 days. She feels like this may have helped her itching some and says it dried up some of the rash that was in her eyebrow line.  She is not sure whether it did anything else.  She has not been taking antihistamine.   Past Medical History:  Diagnosis Date   Alcoholism (HCC)    Allergic rhinitis    Anxiety and depression    Arthritis of knee    bilat, CPPD+ OA   Asthma    Cervical cancer (HCC)     Choledocholithiasis    Colon cancer screening    positive cologuard 09/2023-->colonoscopy 11/02/23 adenoma x 3.   COPD (chronic obstructive pulmonary disease) (HCC)    Diverticulosis 2015   Sigmoid colon (noted on colonoscopy)   Elevated LFTs    GERD (gastroesophageal reflux disease)    History of adenomatous polyp of colon 04/02/2014   Recall 5 yrs   History of pyelonephritis 12/2019   Hypercholesterolemia    Inflammatory arthritis    +mild elev ESR, Rheum eval 07/2020: rapid resp to prednisone , felt to have inflamm arth + OA and CCPD bilat knees. Methotrexate INJ q week.   Insomnia    Iron deficiency anemia 04/2021   started iron, hemoccults x 3 NEG   Lumbar spondylosis    Malignant tumor of cervix (HCC) 07/08/2021   Hx of @ age 63. HYST Removal Reason: Cx Cancer   Myalgia, unspecified site    + arthralgias---she is tender EVERYWHERE (summer 2021)->rheum ref   OSA (obstructive sleep apnea) 08/2021   2022 sleep study (Dr. Chalice) severe, with oxygen nadir of 73%. CPAP.   Osteoarthritis, knee    Osteoporosis    Perforation of cecum    perf'd in context of SBO that followed GB surgery; no colon removed.   Ventral hernia  Past Surgical History:  Procedure Laterality Date   BREAST BIOPSY  2010   benign cyst   CHOLECYSTECTOMY     COLONOSCOPY  04/02/2014   2015 adenoma x 2->recall 5 yrs ([facility]).  10/2023 adenoma x 3 ([facility]AP)   ESOPHAGOGASTRODUODENOSCOPY     11/02/23 reflux esophagitis, no Barrett's   Iliostomy     +  takedown   MOUTH SURGERY     NOSE SURGERY     PARTIAL HYSTERECTOMY  1983   perfor colon repair     SBO after gallbladder surgery   POLYSOMNOGRAPHY  08/2021   Sleep study showed severe OSA, CPAP recommended.   SALPINGOOPHORECTOMY Bilateral 05/10/2014   TONSILLECTOMY     VENTRAL HERNIA REPAIR  2016   open, with mesh    Outpatient Medications Prior to Visit  Medication Sig Dispense Refill   ALPRAZolam  (XANAX ) 0.5 MG tablet TAKE 1 TABLET BY MOUTH  THREE TIMES A DAY AS NEEDED 90 tablet 4   B-D TB SYRINGE 1CC/27GX1/2 27G X 1/2 1 ML MISC Inject into the skin.     busPIRone  (BUSPAR ) 10 MG tablet 1 tab po bid 180 tablet 1   butalbital -acetaminophen-caffeine  (FIORICET) 50-325-40 MG tablet TAKE 1 TABLET BY MOUTH EVERY 6 HOURS AS NEEDED FOR HEADACHE. 30 tablet 1   cyclobenzaprine  (FLEXERIL ) 10 MG tablet TAKE ONE TABLET BY MOUTH 2 TIMES A DAY AS NEEDED FOR MUSCLE SPASMS. 60 tablet 1   Glycopyrrolate -Formoterol  (BEVESPI  AEROSPHERE) 9-4.8 MCG/ACT AERO Take 2 puffs by mouth in the morning and at bedtime. 32.1 each 3   meloxicam  (MOBIC ) 15 MG tablet Take 1 tablet (15 mg total) by mouth daily. 30 tablet 2   montelukast  (SINGULAIR ) 10 MG tablet TAKE 1 TABLET BY MOUTH EVERYDAY AT BEDTIME 90 tablet 1   pantoprazole  (PROTONIX ) 40 MG tablet Take 1 tablet (40 mg total) by mouth 2 (two) times daily. 180 tablet 1   pravastatin  (PRAVACHOL ) 10 MG tablet Take 1 tablet (10 mg total) by mouth daily. 90 tablet 1   SEMAGLUTIDE -WEIGHT MANAGEMENT Bowman Inject 2 mg into the skin once a week. Per rheum     spironolactone  (ALDACTONE ) 25 MG tablet Take 0.5 tablets (12.5 mg total) by mouth daily. (Patient taking differently: Take 12.5 mg by mouth 3 (three) times a week.) 15 tablet 5   traMADol  (ULTRAM ) 50 MG tablet Take 1 tablet (50 mg total) by mouth 4 (four) times daily as needed. 90 tablet 1   valACYclovir  (VALTREX ) 500 MG tablet TAKE 1 TABLET BY MOUTH THEN TAKE 1 TABLET 12 HOURS LATER 180 tablet 1   zolpidem  (AMBIEN ) 10 MG tablet TAKE 1 TABLET BY MOUTH EVERY DAY AT BEDTIME AS NEEDED 90 tablet 1   gabapentin  (NEURONTIN ) 300 MG capsule 1-2 caps po tid as needed for shingles pain 30 capsule 1   predniSONE  (DELTASONE ) 10 MG tablet 3 tabs daily for 5d, then 2 tabs daily for 5d, then 1 tab daily for 5d (Patient taking differently: Take 5 tablets by mouth daily. 3 tabs daily for 5d, then 2 tabs daily for 5d, then 1 tab daily for 5d) 30 tablet 0   No facility-administered  medications prior to visit.    Allergies  Allergen Reactions   Penicillins Anaphylaxis and Rash   Sulfa Antibiotics Anaphylaxis and Rash    Review of Systems  As per HPI  PE:    07/14/2024    2:56 PM 06/30/2024    3:50 PM 04/26/2024    8:05 AM  Vitals with  BMI  Height 5' 4 5' 4 5' 4  Weight 162 lbs 6 oz 165 lbs 10 oz 165 lbs 10 oz  BMI 27.86 28.41 28.41  Systolic 129 110 92  Diastolic 78 73 62  Pulse 72 72 65     Physical Exam Exam chaperoned by Shanda Pizza, CMA  Gen: Alert, well appearing.  Patient is oriented to person, place, time, and situation. Affect: Anxious, pleasant, lucid thought and speech. Face with pink macular rash just underneath the eyebrows and going over the nose and over the cheeks.  Nothing around the mouth or chin.  She has telangiectasias throughout the rash area. No hives, no flaking, no papules or pustules.  There is no swelling of the eyelids or lips There are a few faint pink macules in the right lower abdomen.  No pustules or papules.  No vesicles. 1 small similar macule on the distal left thigh anterior surface.  No surrounding erythema.  She has no joint swelling.  Range of motion fully intact all joints.  LABS:  Last CBC Lab Results  Component Value Date   WBC 6.6 09/09/2023   HGB 15.5 09/09/2023   HCT 47 (A) 09/09/2023   MCV 96.4 06/02/2023   MCH 32.9 11/20/2022   RDW 15.2 06/02/2023   PLT 344 09/09/2023     Chemistry      Component Value Date/Time   NA 141 04/26/2024 0821   NA 141 09/09/2023 0000   K 4.1 04/26/2024 0821   CL 104 04/26/2024 0821   CO2 31 04/26/2024 0821   BUN 9 04/26/2024 0821   BUN 17 09/09/2023 0000   CREATININE 0.82 04/26/2024 0821   CREATININE 0.81 11/20/2022 1415   GLU 84 09/09/2023 0000      Component Value Date/Time   CALCIUM 9.4 04/26/2024 0821   ALKPHOS 67 04/26/2024 0821   AST 13 04/26/2024 0821   ALT 13 04/26/2024 0821   BILITOT 0.3 04/26/2024 0821     Lab Results  Component Value Date    ESRSEDRATE 46 08/12/2020   Lab Results  Component Value Date   TSH 2.05 04/25/2021   IMPRESSION AND PLAN:  #1 focal right lower abdomen painful paresthesias with papular rash--> initial presentation consistent with shingles. Since getting on gabapentin  she has noticed onset of the rash on her face, butterfly distribution. The remainder of her skin lesions do not appear consistent at all with photosensitivity reaction.  She has no symptoms of systemic lupus. Unclear etiology of the bandlike burning paresthesia in T4/T5 dermatomal level bilat.  Check CBC with differential, c-Met, sed rate, CRP, and ANA. Stop gabapentin . Start Claritin 10 mg twice a day. She will take a couple more days of 50 mg prednisone  dose and then start tapering down over the next 15 days, new prescription sent today. She has tramadol  to use for pain.  An After Visit Summary was printed and given to the patient.  I personally spent a total of 44 minutes in the care of the patient today including preparing to see the patient, performing a medically appropriate exam/evaluation, counseling and educating, placing orders, and documenting clinical information in the EHR.   FOLLOW UP: Return in about 1 week (around 07/21/2024) for Follow-up rash.  Signed:  Gerlene Hockey, MD           07/14/2024

## 2024-07-17 LAB — COMPREHENSIVE METABOLIC PANEL WITH GFR
AG Ratio: 1.5 (calc) (ref 1.0–2.5)
ALT: 10 U/L (ref 6–29)
AST: 12 U/L (ref 10–35)
Albumin: 4.6 g/dL (ref 3.6–5.1)
Alkaline phosphatase (APISO): 70 U/L (ref 37–153)
BUN: 11 mg/dL (ref 7–25)
CO2: 27 mmol/L (ref 20–32)
Calcium: 10.1 mg/dL (ref 8.6–10.4)
Chloride: 102 mmol/L (ref 98–110)
Creat: 1.01 mg/dL (ref 0.50–1.05)
Globulin: 3 g/dL (ref 1.9–3.7)
Glucose, Bld: 112 mg/dL — ABNORMAL HIGH (ref 65–99)
Potassium: 4.9 mmol/L (ref 3.5–5.3)
Sodium: 137 mmol/L (ref 135–146)
Total Bilirubin: 0.4 mg/dL (ref 0.2–1.2)
Total Protein: 7.6 g/dL (ref 6.1–8.1)
eGFR: 63 mL/min/1.73m2 (ref >=60)

## 2024-07-17 LAB — C-REACTIVE PROTEIN: CRP: <3 mg/L (ref <8.0)

## 2024-07-17 LAB — CBC WITH DIFFERENTIAL/PLATELET
Absolute Lymphocytes: 1141 {cells}/uL (ref 850–3900)
Absolute Monocytes: 140 {cells}/uL — ABNORMAL LOW (ref 200–950)
Basophils Absolute: 0 {cells}/uL (ref 0–200)
Basophils Relative: 0 %
Eosinophils Absolute: 0 {cells}/uL — ABNORMAL LOW (ref 15–500)
Eosinophils Relative: 0 %
HCT: 43.6 % (ref 35.0–45.0)
Hemoglobin: 14.5 g/dL (ref 11.7–15.5)
MCH: 30.5 pg (ref 27.0–33.0)
MCHC: 33.3 g/dL (ref 32.0–36.0)
MCV: 91.6 fL (ref 80.0–100.0)
MPV: 10.5 fL (ref 7.5–12.5)
Monocytes Relative: 3.9 %
Neutro Abs: 2318 {cells}/uL (ref 1500–7800)
Neutrophils Relative %: 64.4 %
Platelets: 389 Thousand/uL (ref 140–400)
RBC: 4.76 Million/uL (ref 3.80–5.10)
RDW: 13.2 % (ref 11.0–15.0)
Total Lymphocyte: 31.7 %
WBC: 3.6 Thousand/uL — ABNORMAL LOW (ref 3.8–10.8)

## 2024-07-17 LAB — ANA: Anti Nuclear Antibody (ANA): NEGATIVE

## 2024-07-17 LAB — SEDIMENTATION RATE: Sed Rate: 9 mm/h (ref 0–30)

## 2024-07-17 NOTE — Telephone Encounter (Signed)
 Noted

## 2024-07-19 ENCOUNTER — Ambulatory Visit: Payer: Self-pay | Admitting: Family Medicine

## 2024-07-19 NOTE — Telephone Encounter (Signed)
 No further action needed.

## 2024-07-21 ENCOUNTER — Ambulatory Visit: Admitting: Family Medicine

## 2024-07-24 ENCOUNTER — Encounter: Payer: Self-pay | Admitting: Family Medicine

## 2024-07-26 ENCOUNTER — Encounter: Payer: Self-pay | Admitting: Family Medicine

## 2024-08-11 ENCOUNTER — Other Ambulatory Visit: Payer: Self-pay

## 2024-08-11 DIAGNOSIS — Z23 Encounter for immunization: Secondary | ICD-10-CM

## 2024-08-11 MED ORDER — COVID-19 MRNA VAC-TRIS(PFIZER) 30 MCG/0.3ML IM SUSY: 0.3000 mL | PREFILLED_SYRINGE | Freq: Once | INTRAMUSCULAR | 0 refills | Status: AC

## 2024-08-11 NOTE — Telephone Encounter (Signed)
 Please send in COVID vaccine prescription

## 2024-08-11 NOTE — Telephone Encounter (Signed)
Pts chart updated

## 2024-08-30 ENCOUNTER — Other Ambulatory Visit: Payer: Self-pay | Admitting: Family Medicine

## 2024-09-05 NOTE — Progress Notes (Unsigned)
 Dr Dohmeier's    PATIENT: Olivia Mckee DOB: December 16, 1960  REASON FOR VISIT: follow up HISTORY FROM: patient  No chief complaint on file.   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 She was last seen 08/2022 and reported difficulty tolerating pressure settings and FFM. She switched to a nasal pillow but did not feel it was any better tolerated. Since, she reports doing a little better. She was able to get a medium sized nasal pillow and likes it better. Pressure are a little better tolerated. She continues to feel that her ears pop when using CPAP. She stopped therapy for about a month about a month ago. She reports significant worsening of daytime sleepiness. She does note benefit of using CPAP.   She does report 20 year history of headaches. Described as a throbbing/pressure sensation in frontal region. She was seen by PCP and started on a medication but can't remember what it was. It was not effective but she has not followed up. She drinks lots of water. BP is usually well managed. She has lost 70lbs over the past year on Ozempic. She endorses more stress. Her husband works out of town and not able to spend a lot of time alone. She is raising her 13 year old grandson. She continues alprazolam  TID and sees a Veterinary surgeon. She has tried several antidepressants in the past but feels they make her tired and contribute to weight gain.    09/07/2022 ALL: Rebel returns for follow up for complex sleep apnea, mostly obstructive,  on CPAP. She was last seen 02/2022 and having more daytime sleepiness and generalized fatigue. I increased max pressure from 18 to 20 as she was consistently needing max pressure and AHI 4. Since, she reports doing very well. She is sleeping much better. She does feel that air pressure is too high. She wakes at night an feels her  ears are popping. She will cut the CPAP off and restart so pressure is not as strong. Otherwise, doing great.   She has not taken alprazolam  in a month. She reports stress levels are significantly better. Her grandson has moved back in with his mother in [place] .     03/16/2022 ALL: Chelly returns for follow up for OSA on CPAP. She continues to use CPAP most every night. She reports not sleeping well. She wakes feeling really tired. She usually takes a nap around 11am for a couple of hours. She goes to bed around 8:30 and sleeps until around 6-6:30. She has a FFM size medium. She feels that it does not have a good seal at times. She has contacted DME and is expecting a new mask. She is raising her grandsons. She does endorse more stress. She is taking alprazolam  0.5mg  up to three times a day (usually 1mg  at 3pm) and Ambien  10mg  daily around 8:30.     10/28/2021 ALL:  Olivia Mckee is a 63 y.o. female here today for follow up for OSA on CPAP.  HST 09/03/2021 showed severe, complex sleep apnea with mostly obstructive events. AHI was 48.8/hr. O2 nadir 73% with 214 minutes of hypoxia noted. She was started on AutoPAP. She reports doing fairly well. She is not dozing off as much during the day. She wakes feeling more refreshed. She continues to follow with rheumatology for rheumatoid arthritis versus fibromyalgia.  HISTORY: (copied from Dr Dohmeier's previous note)  Olivia Mckee is a 63 y.o. year old White or Caucasian female patient seen here as a referral on 07/23/2021 :   Chief concern according to patient :   they say I have sleep apnea, I snore all my adult  life. 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 I am raising my 2 grandsons. I am worried, overwhelmed at times, I am sleeping a lot'- I am more concerned about memory-     I have the pleasure of seeing Olivia Mckee today, a right-handed White or Caucasian female with a possible sleep disorder.  She has a  has a past medical history of  Alcoholism (HCC), Allergic rhinitis, Anxiety and depression, Asthma, Cervical cancer (HCC), Choledocholithiasis, rheumatoid arthritis, MTX-   COPD (chronic obstructive pulmonary disease) (HCC), Diverticulosis (2015), Elevated LFTs, GERD (gastroesophageal reflux disease), History of adenomatous polyp of colon (04/02/2014), History of pyelonephritis (12/2019),  Hypercholesterolemia, Insomnia, Iron deficiency anemia (04/2021), Osteoporosis, Perforation of cecum, and Ventral hernia..    Sleep relevant medical history: Nocturia: RLS, anemia,  Tonsillectomy,  deviated septum nasal reconstruction-    Family medical /sleep history: mother  with OSA, insomnia, sleep walkers.    Social history:  Patient is working as  and lives in a household with husband and 2 grand children- her daughter was a cocaine user- at the time 6 months and age 62.  The patient currently is staying home- Tobacco use/ quit 15 years ago .  ETOH use / sober for 6 years ,  Caffeine  intake in form of Coffee( /) Soda( 2 cans in Pm ) Tea ( /) or energy drinks. Regular exercise in form of swimming. .   Hobbies : cooking.   Sleep habits are as follows: The patient's dinner time is between 5-7 PM. The patient goes to bed at 9 PM and continues to sleep for 2-3 hours, wakes for 1-2 bathroom breaks, the first time at 12 AM.   The preferred sleep position is sideways, with the support of 1 pillow.  Bed is raised too .Dreams are reportedly frequent/vivid.  5.30  AM is the usual rise time. The patient wakes up spontaneously.  He/ She reports not feeling refreshed or restored in AM, with symptoms such as dry mouth, morning headaches, and residual fatigue.  Naps are taken frequently, unscheduled, lasting from 15 to 120 minutes and are more refreshing than nocturnal sleep.    REVIEW OF SYSTEMS: Out of a complete 14 system review of symptoms, the patient complains only of the following symptoms, generalized pain, fatigue and all other reviewed  systems are negative.  ESS: now 1/24, previously 3/24  ALLERGIES: Allergies  Allergen Reactions   Penicillins Anaphylaxis and Rash   Sulfa Antibiotics Anaphylaxis and Rash    HOME MEDICATIONS: Outpatient Medications Prior to Visit  Medication Sig Dispense Refill   ALPRAZolam  (XANAX ) 0.5 MG tablet TAKE 1 TABLET BY MOUTH THREE TIMES A DAY AS NEEDED 90 tablet 4   B-D TB SYRINGE 1CC/27GX1/2 27G X 1/2 1 ML MISC Inject into the skin.     busPIRone  (BUSPAR ) 10 MG tablet 1 tab po bid 180 tablet 1   butalbital -acetaminophen-caffeine  (FIORICET) 50-325-40 MG tablet TAKE 1 TABLET BY MOUTH EVERY 6 HOURS AS NEEDED FOR HEADACHE. 30 tablet 1   cyclobenzaprine  (FLEXERIL ) 10 MG tablet TAKE ONE TABLET BY MOUTH 2 TIMES A DAY AS NEEDED FOR MUSCLE SPASMS. 60 tablet 1   Glycopyrrolate -Formoterol  (BEVESPI  AEROSPHERE) 9-4.8 MCG/ACT AERO Take 2 puffs by mouth in  the morning and at bedtime. 32.1 each 3   meloxicam  (MOBIC ) 15 MG tablet Take 1 tablet (15 mg total) by mouth daily. 30 tablet 2   montelukast  (SINGULAIR ) 10 MG tablet TAKE 1 TABLET BY MOUTH EVERYDAY AT BEDTIME 90 tablet 1   pantoprazole  (PROTONIX ) 40 MG tablet Take 1 tablet (40 mg total) by mouth 2 (two) times daily. 180 tablet 1   pravastatin  (PRAVACHOL ) 10 MG tablet Take 1 tablet (10 mg total) by mouth daily. 90 tablet 1   predniSONE  (DELTASONE ) 10 MG tablet 3 tabs daily for 5d, then 2 tabs daily for 5d, then 1 tab daily for 5d 30 tablet 0   SEMAGLUTIDE -WEIGHT MANAGEMENT [place] Inject 2 mg into the skin once a week. Per rheum     spironolactone  (ALDACTONE ) 25 MG tablet Take 0.5 tablets (12.5 mg total) by mouth daily. (Patient taking differently: Take 12.5 mg by mouth 3 (three) times a week.) 15 tablet 5   traMADol  (ULTRAM ) 50 MG tablet Take 1 tablet (50 mg total) by mouth 4 (four) times daily as needed. 90 tablet 1   valACYclovir  (VALTREX ) 500 MG tablet TAKE 1 TABLET BY MOUTH THEN TAKE 1 TABLET 12 HOURS LATER 180 tablet 1   zolpidem  (AMBIEN ) 10 MG tablet  TAKE 1 TABLET BY MOUTH EVERY DAY AT BEDTIME AS NEEDED 90 tablet 1   No facility-administered medications prior to visit.    PAST MEDICAL HISTORY: Past Medical History:  Diagnosis Date   Alcoholism (HCC)    Allergic rhinitis    Anxiety and depression    Arthritis of knee    bilat, CPPD+ OA   Asthma    Cervical cancer (HCC)    Choledocholithiasis    Colon cancer screening    positive cologuard 09/2023-->colonoscopy 11/02/23 adenoma x 3.   COPD (chronic obstructive pulmonary disease) (HCC)    Diverticulosis 2015   Sigmoid colon (noted on colonoscopy)   Elevated LFTs    GERD (gastroesophageal reflux disease)    History of adenomatous polyp of colon 04/02/2014   Recall 5 yrs   History of pyelonephritis 12/2019   Hypercholesterolemia    Inflammatory arthritis    +mild elev ESR, Rheum eval 07/2020: rapid resp to prednisone , felt to have inflamm arth + OA and CCPD bilat knees. Methotrexate INJ q week.   Insomnia    Iron deficiency anemia 04/2021   started iron, hemoccults x 3 NEG   Lumbar spondylosis    Malignant tumor of cervix (HCC) 07/08/2021   Hx of @ age 50. HYST Removal Reason: Cx Cancer   Myalgia, unspecified site    + arthralgias---she is tender EVERYWHERE (summer 2021)->rheum ref   OSA (obstructive sleep apnea) 08/2021   2022 sleep study (Dr. Chalice) severe, with oxygen nadir of 73%. CPAP.   Osteoarthritis, knee    Osteoporosis    Perforation of cecum    perf'd in context of SBO that followed GB surgery; no colon removed.   Ventral hernia     PAST SURGICAL HISTORY: Past Surgical History:  Procedure Laterality Date   BREAST BIOPSY  2010   benign cyst   CHOLECYSTECTOMY     COLONOSCOPY  04/02/2014   2015 adenoma x 2->recall 5 yrs ([facility]).  10/2023 adenoma x 3 ([facility])   ESOPHAGOGASTRODUODENOSCOPY     11/02/23 reflux esophagitis, no Barrett's   Iliostomy     +  takedown   MOUTH SURGERY     NOSE SURGERY     PARTIAL HYSTERECTOMY  1983  perfor colon repair      SBO after gallbladder surgery   POLYSOMNOGRAPHY  08/2021   Sleep study showed severe OSA, CPAP recommended.   SALPINGOOPHORECTOMY Bilateral 05/10/2014   TONSILLECTOMY     VENTRAL HERNIA REPAIR  2016   open, with mesh    FAMILY HISTORY: Family History  Problem Relation Age of Onset   Diabetes Mother    Hypertension Mother    Hearing loss Mother    Depression Mother    Diabetes Father    Hearing loss Father    Early death Father    Stroke Maternal Grandmother    Kidney disease Maternal Grandmother    Diabetes Maternal Grandmother    Heart disease Maternal Grandmother    Arthritis Maternal Grandmother    Stroke Maternal Grandfather    Prostate cancer Maternal Grandfather    Diabetes Maternal Grandfather    Heart disease Maternal Grandfather    Arthritis Maternal Grandfather    Alcohol abuse Maternal Grandfather    Depression Maternal Grandfather    Diabetes Paternal Grandmother    Alcohol abuse Paternal Grandmother    Diabetes Paternal Grandfather    Alcohol abuse Paternal Grandfather    Heart disease Paternal Grandfather     SOCIAL HISTORY: Social History   Socioeconomic History   Marital status: Married    Spouse name: Arley   Number of children: 1   Years of education: Not on file   Highest education level: Some college, no degree  Occupational History   Not on file  Tobacco Use   Smoking status: Former    Current packs/day: 0.00    Average packs/day: 1 pack/day for 25.0 years (25.0 ttl pk-yrs)    Types: Cigarettes    Start date: 11/23/1985    Quit date: 11/23/2010    Years since quitting: 13.7   Smokeless tobacco: Never  Substance and Sexual Activity   Alcohol use: No   Drug use: No   Sexual activity: Not on file  Other Topics Concern   Not on file  Social History Narrative   Married, 1 daughter, several grandkids (2 of whom pt is raising).   Educ: some college   Occup: retired Administrator.   Tob: 20 pack-yr hx, quit approx 2005.   Alc: hx of  alcoholism, quit 2018.   No hx of drug abuse.      left handed   Caffeine : 2 12oz can a day   Social Drivers of Corporate investment banker Strain: Low Risk  (11/09/2023)   Overall Financial Resource Strain (CARDIA)    Difficulty of Paying Living Expenses: Not very hard  Food Insecurity: No Food Insecurity (11/09/2023)   Hunger Vital Sign    Worried About Running Out of Food in the Last Year: Never true    Ran Out of Food in the Last Year: Never true  Transportation Needs: No Transportation Needs (11/09/2023)   PRAPARE - Administrator, Civil Service (Medical): No    Lack of Transportation (Non-Medical): No  Physical Activity: Insufficiently Active (11/09/2023)   Exercise Vital Sign    Days of Exercise per Week: 4 days    Minutes of Exercise per Session: 20 min  Stress: Stress Concern Present (11/09/2023)   [employer] of Occupational Health - Occupational Stress Questionnaire    Feeling of Stress : To some extent  Social Connections: Moderately Isolated (11/09/2023)   Social Connection and Isolation Panel    Frequency of Communication with Friends and Family: Three times  a week    Frequency of Social Gatherings with Friends and Family: Never    Attends Religious Services: Never    Database administrator or Organizations: No    Attends Engineer, structural: Not on file    Marital Status: Married  Catering manager Violence: Not At Risk (10/13/2023)   Received from [facility]   HITS    Over the last 12 months how often did your partner physically hurt you?: Never    Over the last 12 months how often did your partner insult you or talk down to you?: Never    Over the last 12 months how often did your partner threaten you with physical harm?: Never    Over the last 12 months how often did your partner scream or curse at you?: Never     PHYSICAL EXAM  There were no vitals filed for this visit.     There is no height or weight on file to  calculate BMI.  Generalized: Well developed, in no acute distress  Cardiology: normal rate and rhythm, no murmur noted Respiratory: clear to auscultation bilaterally  Neurological examination  Mentation: Alert oriented to time, place, history taking. Follows all commands speech and language fluent Cranial nerve II-XII: Pupils were equal round reactive to light. Extraocular movements were full, visual field were full  Motor: The motor testing reveals 5 over 5 strength of all 4 extremities. Good symmetric motor tone is noted throughout.  Gait and station: Gait is normal.    DIAGNOSTIC DATA (LABS, IMAGING, TESTING) - I reviewed patient records, labs, notes, testing and imaging myself where available.      No data to display           Lab Results  Component Value Date   WBC 3.6 (L) 07/14/2024   HGB 14.5 07/14/2024   HCT 43.6 07/14/2024   MCV 91.6 07/14/2024   PLT 389 07/14/2024      Component Value Date/Time   NA 137 07/14/2024 1525   NA 141 09/09/2023 0000   K 4.9 07/14/2024 1525   CL 102 07/14/2024 1525   CO2 27 07/14/2024 1525   GLUCOSE 112 (H) 07/14/2024 1525   BUN 11 07/14/2024 1525   BUN 17 09/09/2023 0000   CREATININE 1.01 07/14/2024 1525   CALCIUM 10.1 07/14/2024 1525   PROT 7.6 07/14/2024 1525   ALBUMIN 4.0 04/26/2024 0821   AST 12 07/14/2024 1525   ALT 10 07/14/2024 1525   ALKPHOS 67 04/26/2024 0821   BILITOT 0.4 07/14/2024 1525   Lab Results  Component Value Date   CHOL 158 04/26/2024   HDL 55.50 04/26/2024   LDLCALC 75 04/26/2024   LDLDIRECT 100.0 04/25/2021   TRIG 140.0 04/26/2024   CHOLHDL 3 04/26/2024   Lab Results  Component Value Date   HGBA1C 5.6 11/25/2022   HGBA1C 5.6 11/25/2022   HGBA1C 5.6 (A) 11/25/2022   HGBA1C 5.6 11/25/2022   No results found for: VITAMINB12 Lab Results  Component Value Date   TSH 2.05 04/25/2021     ASSESSMENT AND PLAN 63 y.o. year old female  has a past medical history of Alcoholism (HCC), Allergic  rhinitis, Anxiety and depression, Arthritis of knee, Asthma, Cervical cancer (HCC), Choledocholithiasis, Colon cancer screening, COPD (chronic obstructive pulmonary disease) (HCC), Diverticulosis (2015), Elevated LFTs, GERD (gastroesophageal reflux disease), History of adenomatous polyp of colon (04/02/2014), History of pyelonephritis (12/2019), Hypercholesterolemia, Inflammatory arthritis, Insomnia, Iron deficiency anemia (04/2021), Lumbar spondylosis, Malignant tumor of cervix (HCC) (07/08/2021),  Myalgia, unspecified site, OSA (obstructive sleep apnea) (08/2021), Osteoarthritis, knee, Osteoporosis, Perforation of cecum, and Ventral hernia. here with   No diagnosis found.   Asley Bence is doing well on CPAP therapy. Compliance over the past 90 days is acceptable, 72% daily usage. Four our compliance suboptimal at 67%. She does note increased pressure at periods during the night and feels this is making her ears pop. I will decrease max pressure to 19cmH20. AHI well managed. We have reviewed concerns of headaches. She was encouraged to work on improving compliance and continue discussion with PCP. May consider prevention medications as appropriate pending PCP eval. She was encouraged to continue using CPAP nightly and for greater than 4 hours each night. I will check pressure settings and compliance in about 4-6 weeks. We will update supply orders as indicated. Risks of untreated sleep apnea review and education materials provided. We discussed Healthy lifestyle habits encouraged. She will follow up in 1 year, sooner if needed. She verbalizes understanding and agreement with this plan.    No orders of the defined types were placed in this encounter.     No orders of the defined types were placed in this encounter.      Greig Forbes, FNP-C 09/05/2024, 12:08 PM [facility] [address] [place], [place] [address] [phone]

## 2024-09-05 NOTE — Patient Instructions (Incomplete)

## 2024-09-06 ENCOUNTER — Ambulatory Visit: Payer: Commercial Managed Care - PPO | Admitting: Family Medicine

## 2024-09-06 ENCOUNTER — Encounter: Payer: Self-pay | Admitting: Family Medicine

## 2024-09-06 VITALS — BP 118/86 | Resp 15 | Ht 64.0 in | Wt 164.5 lb

## 2024-09-06 DIAGNOSIS — G44209 Tension-type headache, unspecified, not intractable: Secondary | ICD-10-CM | POA: Diagnosis not present

## 2024-09-06 DIAGNOSIS — F411 Generalized anxiety disorder: Secondary | ICD-10-CM

## 2024-09-06 DIAGNOSIS — G4739 Other sleep apnea: Secondary | ICD-10-CM

## 2024-09-06 DIAGNOSIS — G4730 Sleep apnea, unspecified: Secondary | ICD-10-CM | POA: Diagnosis not present

## 2024-09-06 DIAGNOSIS — R634 Abnormal weight loss: Secondary | ICD-10-CM

## 2024-09-06 NOTE — Progress Notes (Signed)
 Olivia Mckee

## 2024-09-16 ENCOUNTER — Other Ambulatory Visit: Payer: Self-pay | Admitting: Family Medicine

## 2024-09-29 ENCOUNTER — Ambulatory Visit (INDEPENDENT_AMBULATORY_CARE_PROVIDER_SITE_OTHER): Admitting: Neurology

## 2024-09-29 DIAGNOSIS — G4733 Obstructive sleep apnea (adult) (pediatric): Secondary | ICD-10-CM

## 2024-09-29 DIAGNOSIS — G4730 Sleep apnea, unspecified: Secondary | ICD-10-CM

## 2024-09-29 DIAGNOSIS — G4739 Other sleep apnea: Secondary | ICD-10-CM

## 2024-10-02 ENCOUNTER — Encounter: Payer: Self-pay | Admitting: Family Medicine

## 2024-10-04 NOTE — Telephone Encounter (Signed)
 No further action needed at this time.

## 2024-10-13 ENCOUNTER — Other Ambulatory Visit: Payer: Self-pay | Admitting: Family Medicine

## 2024-10-13 NOTE — Progress Notes (Unsigned)
 SABRA

## 2024-10-15 NOTE — Procedures (Signed)
 [facility]   HOME SLEEP TEST REPORT ( by Elene  mail -out device )    Study Protocol:     The SANSA chest-worn sensor - an FDA cleared and DOT approved type 4 home sleep test device - measures eight physiological channels,  including blood oxygen saturation (measured via PPG [photoplethysmography]), EKG-derived heart rate, respiratory effort, chest movement (measured via accelerometer), snoring, body position, and actigraphy.    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 She reports doing fairly well until having a shingles infection 06/2024. She was unable to use her machine for about 3 weeks. She resumed therapy for a couple of weeks in 07/2024 but felt that she was sleeping better without CPAP. She has lost 80lbs. She would like to repeat HST to evaluate need for CPAP.    She started buspirone  BID and reports it has helped with headaches. She uses Fioricet about 3-4 times a month. Both prescribed by PCP. Currently on 6-19 cm water pressure CPAP for the treatment of complex sleep apnea, AHI 0.8/h.  She had shingles and feels she sleeps better without therapy following 80lb weight reduction. AHI well managed. Headaches better with anxiety treatment. I will order HST for eval following weight loss.     Epworth sleepiness score: 0/24. FFS at  X / 63 points   BMI:  28.2 kg/m  Neck Circumference: X   FINDINGS:  Sleep Summary:   Start of Recording Time (hours, min):   November 10 /25 at 2300 hrs. 15 minutes  Total Sleep Time (hours, min): 7-hour and 28-minutes Sleep efficiency %;   82 %                                    Respiratory Indices AHI- by AASM  criteria of scoring; total apnea hypopnea index was  2.5/ hour   Calculated pAHI (per hour):    2.5/h                                               Positional  respiratory activity  / snoring : sporadic .   Oxygen Saturation  during Sleep    Oxygen Saturation (%) Mean:    96.5%                 O2 Saturation Range (%):   60-99.9%                                    O2 Saturation (minutes) <89%:    0 minutes       Pulse Rate in Sleep :   Pulse Mean (bpm):  68 bpm                Pulse Range:   between 60 and 95 bpm                IMPRESSION:  This HST detected no residual sleep apnea and continued use of CPAP is not indicated.  Any Patient endorsing a high level of sleepiness should be cautioned not to drive, work at heights, or operate dangerous machinery or heavy equipment when tired or sleepy.  Review of good sleep hygiene measures took place in the initial consultation but should be revisited ( Your guide to better sleep  a publication by the NIH is a good source of information).   The referring provider will be notified of the test results.    I certify that I have reviewed the raw data recording prior to the issuance of this report in accordance with the standards of the American Academy of Sleep Medicine (AASM).    INTERPRETING PHYSICIAN:   Dedra Gores, MD  [facility] and [facility] Sleep Board certified by The [employer] of Sleep Medicine and Diplomate of the [employer] of Sleep Medicine. Board certified In Neurology through the ABPN, Fellow of the [employer] of Neurology.

## 2024-10-16 ENCOUNTER — Ambulatory Visit: Payer: Self-pay | Admitting: Family Medicine

## 2024-10-18 ENCOUNTER — Encounter: Payer: Self-pay | Admitting: Family Medicine

## 2024-10-18 ENCOUNTER — Other Ambulatory Visit: Payer: Self-pay

## 2024-10-18 DIAGNOSIS — G44211 Episodic tension-type headache, intractable: Secondary | ICD-10-CM

## 2024-10-18 MED ORDER — BUTALBITAL-APAP-CAFFEINE 50-325-40 MG PO TABS: ORAL_TABLET | ORAL | 1 refills | Status: DC

## 2024-10-18 NOTE — Telephone Encounter (Signed)
 Requesting: butalbital -acetaminophen-caffeine  (FIORICET) 50-325-40 MG tablet  Contract: N/A this med UDS: 06/02/23 Last Visit: 07/14/24 Next Visit: 11/09/24 Last Refill: 04/12/24 (30,1)  Please Advise. Rx pending

## 2024-11-09 ENCOUNTER — Encounter: Payer: Self-pay | Admitting: Family Medicine

## 2024-11-09 ENCOUNTER — Ambulatory Visit: Admitting: Family Medicine

## 2024-11-09 VITALS — BP 111/73 | HR 68 | Temp 97.5°F | Ht 64.0 in | Wt 162.6 lb

## 2024-11-09 DIAGNOSIS — J441 Chronic obstructive pulmonary disease with (acute) exacerbation: Secondary | ICD-10-CM

## 2024-11-09 DIAGNOSIS — F3341 Major depressive disorder, recurrent, in partial remission: Secondary | ICD-10-CM | POA: Diagnosis not present

## 2024-11-09 DIAGNOSIS — F411 Generalized anxiety disorder: Secondary | ICD-10-CM | POA: Diagnosis not present

## 2024-11-09 DIAGNOSIS — Z0001 Encounter for general adult medical examination with abnormal findings: Secondary | ICD-10-CM

## 2024-11-09 DIAGNOSIS — E7800 Pure hypercholesterolemia, unspecified: Secondary | ICD-10-CM

## 2024-11-09 DIAGNOSIS — G44211 Episodic tension-type headache, intractable: Secondary | ICD-10-CM | POA: Diagnosis not present

## 2024-11-09 DIAGNOSIS — Z79899 Other long term (current) drug therapy: Secondary | ICD-10-CM | POA: Diagnosis not present

## 2024-11-09 DIAGNOSIS — Z23 Encounter for immunization: Secondary | ICD-10-CM | POA: Diagnosis not present

## 2024-11-09 DIAGNOSIS — Z0000 Encounter for general adult medical examination without abnormal findings: Secondary | ICD-10-CM

## 2024-11-09 DIAGNOSIS — G894 Chronic pain syndrome: Secondary | ICD-10-CM | POA: Diagnosis not present

## 2024-11-09 LAB — LIPID PANEL
Cholesterol: 159 mg/dL (ref 28–200)
HDL: 62.1 mg/dL (ref >39.00)
LDL Cholesterol: 82 mg/dL (ref 10–99)
NonHDL: 96.63
Total CHOL/HDL Ratio: 3
Triglycerides: 75 mg/dL (ref 10.0–149.0)
VLDL: 15 mg/dL (ref 0.0–40.0)

## 2024-11-09 LAB — COMPREHENSIVE METABOLIC PANEL WITH GFR
ALT: 11 U/L (ref 3–35)
AST: 16 U/L (ref 5–37)
Albumin: 4.2 g/dL (ref 3.5–5.2)
Alkaline Phosphatase: 75 U/L (ref 39–117)
BUN: 11 mg/dL (ref 6–23)
CO2: 30 meq/L (ref 19–32)
Calcium: 9.7 mg/dL (ref 8.4–10.5)
Chloride: 100 meq/L (ref 96–112)
Creatinine, Ser: 0.88 mg/dL (ref 0.40–1.20)
GFR: 69.7 mL/min (ref >60.00)
Glucose, Bld: 73 mg/dL (ref 70–99)
Potassium: 4 meq/L (ref 3.5–5.1)
Sodium: 138 meq/L (ref 135–145)
Total Bilirubin: 0.4 mg/dL (ref 0.2–1.2)
Total Protein: 6.8 g/dL (ref 6.0–8.3)

## 2024-11-09 LAB — CBC WITH DIFFERENTIAL/PLATELET
Basophils Absolute: 0.1 K/uL (ref 0.0–0.1)
Basophils Relative: 2.8 % (ref 0.0–3.0)
Eosinophils Absolute: 0.2 K/uL (ref 0.0–0.7)
Eosinophils Relative: 6.2 % — ABNORMAL HIGH (ref 0.0–5.0)
HCT: 41.9 % (ref 36.0–46.0)
Hemoglobin: 14.2 g/dL (ref 12.0–15.0)
Lymphocytes Relative: 43.2 % (ref 12.0–46.0)
Lymphs Abs: 1.6 K/uL (ref 0.7–4.0)
MCHC: 33.8 g/dL (ref 30.0–36.0)
MCV: 90.8 fl (ref 78.0–100.0)
Monocytes Absolute: 0.5 K/uL (ref 0.1–1.0)
Monocytes Relative: 12.3 % — ABNORMAL HIGH (ref 3.0–12.0)
Neutro Abs: 1.3 K/uL — ABNORMAL LOW (ref 1.4–7.7)
Neutrophils Relative %: 35.5 % — ABNORMAL LOW (ref 43.0–77.0)
Platelets: 261 K/uL (ref 150.0–400.0)
RBC: 4.62 Mil/uL (ref 3.87–5.11)
RDW: 13.7 % (ref 11.5–15.5)
WBC: 3.7 K/uL — ABNORMAL LOW (ref 4.0–10.5)

## 2024-11-09 MED ORDER — PRAVASTATIN SODIUM 10 MG PO TABS: 10.0000 mg | ORAL_TABLET | Freq: Every day | ORAL | 1 refills | Status: AC

## 2024-11-09 MED ORDER — PANTOPRAZOLE SODIUM 40 MG PO TBEC: 40.0000 mg | DELAYED_RELEASE_TABLET | Freq: Two times a day (BID) | ORAL | 1 refills | Status: AC

## 2024-11-09 MED ORDER — BUSPIRONE HCL 10 MG PO TABS: ORAL_TABLET | ORAL | 1 refills | Status: AC

## 2024-11-09 MED ORDER — MONTELUKAST SODIUM 10 MG PO TABS: 10.0000 mg | ORAL_TABLET | Freq: Every day | ORAL | 1 refills | Status: AC

## 2024-11-09 MED ORDER — BUTALBITAL-APAP-CAFFEINE 50-325-40 MG PO TABS: ORAL_TABLET | ORAL | 1 refills | Status: AC

## 2024-11-09 NOTE — Progress Notes (Signed)
 Office Note 11/09/2024  CC:  Chief Complaint  Patient presents with   Annual Exam    Pt is fasting    HPI:  Patient is a 63 y.o. female who is here for annual health maintenance exam and anxiety and depression, insomnia, and hypercholesterolemia. A/P as of last visit: #1 hypercholesterolemia. Doing well on pravastatin  10 mg a day. Monitor lipid panel and hepatic panel today.   2.  Menopausal hair loss.  Start trial of 12.5 mg spironolactone  daily. Therapeutic expectations and side effect profile of medication discussed today.  Patient's questions answered. Basic metabolic panel today.   3.  Recurrent major depressive disorder, in remission.  GAD, doing well on BuSpar  10 mg twice a day and alprazolam  0.5 mg 3 times a day.   4. arthritis of multiple etiologies: Followed by rheumatology and felt to have a component of inflammatory arthritis as well as CPPD and osteoarthritis. Doing much better since she has had significant weight loss with semaglutide --> prescribed by her rheumatologist. She has been off of methotrexate and leucovorin.  INTERIM HX: Olivia Mckee is happy to say as she feels very good. The rash that I saw her for back in August cleared up not long after I saw her and has not returned.  No acute concerns.  PMP AWARE reviewed today: most recent rx for Ambien  was filled 09/09/2024, # 90, rx by me.  Most recent alprazolam  prescription was filled 08/20/2024, #90, prescription by me. No red flags.  Past Medical History:  Diagnosis Date   Alcoholism (HCC)    Allergic rhinitis    Allergy 2002   Anxiety and depression    Arthritis of knee    bilat, CPPD+ OA   Asthma    Cervical cancer (HCC)    Choledocholithiasis    Colon cancer screening    positive cologuard 09/2023-->colonoscopy 11/02/23 adenoma x 3.   COPD (chronic obstructive pulmonary disease) (HCC)    Diverticulosis 2015   Sigmoid colon (noted on colonoscopy)   Elevated LFTs    GERD (gastroesophageal  reflux disease)    History of adenomatous polyp of colon 04/02/2014   Recall 5 yrs   History of pyelonephritis 12/2019   Hypercholesterolemia    Inflammatory arthritis    +mild elev ESR, Rheum eval 07/2020: rapid resp to prednisone , felt to have inflamm arth + OA and CCPD bilat knees. Methotrexate INJ q week.   Insomnia    Iron deficiency anemia 04/2021   started iron, hemoccults x 3 NEG   Lumbar spondylosis    Malignant tumor of cervix (HCC) 07/08/2021   Hx of @ age 23. HYST Removal Reason: Cx Cancer   Myalgia, unspecified site    + arthralgias---she is tender EVERYWHERE (summer 2021)->rheum ref   OSA (obstructive sleep apnea) 08/2021   2022 sleep study (Dr. Chalice) severe, with oxygen nadir of 73%. CPAP.   Osteoarthritis, knee    Osteoporosis    Perforation of cecum    perf'd in context of SBO that followed GB surgery; no colon removed.   Sleep apnea 2021   Ventral hernia     Past Surgical History:  Procedure Laterality Date   ABDOMINAL HYSTERECTOMY  1983   BREAST BIOPSY  2010   benign cyst   CHOLECYSTECTOMY     COLON SURGERY  2016   COLONOSCOPY  04/02/2014   2015 adenoma x 2->recall 5 yrs ([facility]).  10/2023 adenoma x 3 ([facility])   COSMETIC SURGERY  1985   ESOPHAGOGASTRODUODENOSCOPY  11/02/23 reflux esophagitis, no Barrett's   HERNIA REPAIR  2016   Iliostomy     +  takedown   MOUTH SURGERY     NOSE SURGERY     PARTIAL HYSTERECTOMY  1983   perfor colon repair     SBO after gallbladder surgery   POLYSOMNOGRAPHY  08/2021   Sleep study showed severe OSA, CPAP recommended.   SALPINGOOPHORECTOMY Bilateral 05/10/2014   SMALL INTESTINE SURGERY  2015   TONSILLECTOMY     VENTRAL HERNIA REPAIR  2016   open, with mesh    Family History  Problem Relation Age of Onset   Diabetes Mother    Hypertension Mother    Hearing loss Mother    Depression Mother    Anxiety disorder Mother    Obesity Mother    Diabetes Father    Hearing loss Father    Early death Father     Anxiety disorder Father    Stroke Father    Stroke Maternal Grandmother    Kidney disease Maternal Grandmother    Diabetes Maternal Grandmother    Heart disease Maternal Grandmother    Arthritis Maternal Grandmother    Early death Maternal Grandmother    Obesity Maternal Grandmother    Stroke Maternal Grandfather    Prostate cancer Maternal Grandfather    Diabetes Maternal Grandfather    Heart disease Maternal Grandfather    Arthritis Maternal Grandfather    Alcohol abuse Maternal Grandfather    Depression Maternal Grandfather    Diabetes Paternal Grandmother    Alcohol abuse Paternal Grandmother    Diabetes Paternal Grandfather    Alcohol abuse Paternal Grandfather    Heart disease Paternal Grandfather    Diabetes Maternal Uncle    Obesity Maternal Aunt     Social History   Socioeconomic History   Marital status: Married    Spouse name: Arley   Number of children: 1   Years of education: Not on file   Highest education level: Some college, no degree  Occupational History   Not on file  Tobacco Use   Smoking status: Former    Current packs/day: 0.00    Average packs/day: 1 pack/day for 25.0 years (25.0 ttl pk-yrs)    Types: Cigarettes    Start date: 11/23/1985    Quit date: 11/23/2010    Years since quitting: 13.9   Smokeless tobacco: Never  Substance and Sexual Activity   Alcohol use: No   Drug use: No   Sexual activity: Not on file  Other Topics Concern   Not on file  Social History Narrative   Married, 1 daughter, several grandkids (2 of whom pt is raising).   Educ: some college   Occup: retired administrator.   Tob: 20 pack-yr hx, quit approx 2005.   Alc: hx of alcoholism, quit 2018.   No hx of drug abuse.      left handed   Caffeine : 2 12oz can a day   Social Drivers of Health   Tobacco Use: Medium Risk (11/09/2024)   Patient History    Smoking Tobacco Use: Former    Smokeless Tobacco Use: Never    Passive Exposure: Not on file  Financial Resource  Strain: Low Risk (11/09/2023)   Overall Financial Resource Strain (CARDIA)    Difficulty of Paying Living Expenses: Not very hard  Food Insecurity: No Food Insecurity (11/09/2023)   Hunger Vital Sign    Worried About Running Out of Food in the Last Year: Never true  Ran Out of Food in the Last Year: Never true  Transportation Needs: No Transportation Needs (11/09/2023)   PRAPARE - Administrator, Civil Service (Medical): No    Lack of Transportation (Non-Medical): No  Physical Activity: Insufficiently Active (11/09/2023)   Exercise Vital Sign    Days of Exercise per Week: 4 days    Minutes of Exercise per Session: 20 min  Stress: Stress Concern Present (11/09/2023)   [employer] of Occupational Health - Occupational Stress Questionnaire    Feeling of Stress : To some extent  Social Connections: Moderately Isolated (11/09/2023)   Social Connection and Isolation Panel    Frequency of Communication with Friends and Family: Three times a week    Frequency of Social Gatherings with Friends and Family: Never    Attends Religious Services: Never    Database Administrator or Organizations: No    Attends Engineer, Structural: Not on file    Marital Status: Married  Catering Manager Violence: Not At Risk (10/13/2023)   Received from [facility]   HITS    Over the last 12 months how often did your partner physically hurt you?: Never    Over the last 12 months how often did your partner insult you or talk down to you?: Never    Over the last 12 months how often did your partner threaten you with physical harm?: Never    Over the last 12 months how often did your partner scream or curse at you?: Never  Depression (PHQ2-9): Low Risk (11/09/2024)   Depression (PHQ2-9)    PHQ-2 Score: 0  Alcohol Screen: Not on file  Housing: Low Risk (11/09/2023)   Housing Stability Vital Sign    Unable to Pay for Housing in the Last Year: No    Number of Times Moved in the Last  Year: 0    Homeless in the Last Year: No  Utilities: Not At Risk (10/13/2023)   Received from [facility] Utilities    Threatened with loss of utilities: No  Health Literacy: Not on file    Outpatient Medications Prior to Visit  Medication Sig Dispense Refill   ALPRAZolam  (XANAX ) 0.5 MG tablet TAKE 1 TABLET BY MOUTH THREE TIMES A DAY AS NEEDED 90 tablet 4   B-D TB SYRINGE 1CC/27GX1/2 27G X 1/2 1 ML MISC Inject into the skin.     cyclobenzaprine  (FLEXERIL ) 10 MG tablet TAKE ONE TABLET BY MOUTH 2 TIMES A DAY AS NEEDED FOR MUSCLE SPASMS. 60 tablet 1   Glycopyrrolate -Formoterol  (BEVESPI  AEROSPHERE) 9-4.8 MCG/ACT AERO Take 2 puffs by mouth in the morning and at bedtime. 32.1 each 3   meloxicam  (MOBIC ) 15 MG tablet Take 1 tablet (15 mg total) by mouth daily. 30 tablet 2   SEMAGLUTIDE -WEIGHT MANAGEMENT [place] Inject 2 mg into the skin once a week. Per rheum     traMADol  (ULTRAM ) 50 MG tablet Take 1 tablet (50 mg total) by mouth 4 (four) times daily as needed. 90 tablet 1   valACYclovir  (VALTREX ) 500 MG tablet TAKE 1 TABLET BY MOUTH THEN TAKE 1 TABLET 12 HOURS LATER 180 tablet 1   zolpidem  (AMBIEN ) 10 MG tablet TAKE 1 TABLET BY MOUTH EVERY DAY AT BEDTIME AS NEEDED 90 tablet 1   busPIRone  (BUSPAR ) 10 MG tablet 1 tab po bid 180 tablet 1   butalbital -acetaminophen-caffeine  (FIORICET) 50-325-40 MG tablet 1-2 tabs po once a day as needed for severe intractable headache 30 tablet 1  montelukast  (SINGULAIR ) 10 MG tablet TAKE 1 TABLET BY MOUTH EVERYDAY AT BEDTIME 90 tablet 1   pantoprazole  (PROTONIX ) 40 MG tablet Take 1 tablet (40 mg total) by mouth 2 (two) times daily. 180 tablet 1   pravastatin  (PRAVACHOL ) 10 MG tablet Take 1 tablet (10 mg total) by mouth daily. 90 tablet 1   No facility-administered medications prior to visit.    Allergies[1]  Review of Systems  Constitutional:  Negative for appetite change, chills, fatigue and fever.  HENT:  Negative for congestion, dental problem, ear  pain and sore throat.   Eyes:  Negative for discharge, redness and visual disturbance.  Respiratory:  Negative for cough, chest tightness, shortness of breath and wheezing.   Cardiovascular:  Negative for chest pain, palpitations and leg swelling.  Gastrointestinal:  Negative for abdominal pain, blood in stool, diarrhea, nausea and vomiting.  Genitourinary:  Negative for difficulty urinating, dysuria, flank pain, frequency, hematuria and urgency.  Musculoskeletal:  Negative for arthralgias, back pain, joint swelling, myalgias and neck stiffness.  Skin:  Negative for pallor and rash.  Neurological:  Negative for dizziness, speech difficulty, weakness and headaches.  Hematological:  Negative for adenopathy. Does not bruise/bleed easily.  Psychiatric/Behavioral:  Negative for confusion and sleep disturbance. The patient is not nervous/anxious.     PE;    11/09/2024    8:04 AM 09/06/2024   10:49 AM 07/14/2024    2:56 PM  Vitals with BMI  Height 5' 4 5' 4 5' 4  Weight 162 lbs 10 oz 164 lbs 8 oz 162 lbs 6 oz  BMI 27.9 28.22 27.86  Systolic 111 118 870  Diastolic 73 86 78  Pulse 68  72    Gen: Alert, well appearing.  Patient is oriented to person, place, time, and situation. AFFECT: pleasant, lucid thought and speech. ENT: Ears: EACs clear, normal epithelium.  TMs with good light reflex and landmarks bilaterally.  Eyes: no injection, icteris, swelling, or exudate.  EOMI, PERRLA. Nose: no drainage or turbinate edema/swelling.  No injection or focal lesion.  Mouth: lips without lesion/swelling.  Oral mucosa pink and moist.  Dentition intact and without obvious caries or gingival swelling.  Oropharynx without erythema, exudate, or swelling.  Neck: supple/nontender.  No LAD, mass, or TM.  Carotid pulses 2+ bilaterally, without bruits. CV: RRR, no m/r/g.   LUNGS: CTA bilat, nonlabored resps, good aeration in all lung fields. ABD: soft, NT, ND, BS normal.  No hepatospenomegaly or mass.  No  bruits. EXT: no clubbing, cyanosis, or edema.  Musculoskeletal: no joint swelling, erythema, warmth, or tenderness.  ROM of all joints intact. Skin - no sores or suspicious lesions or rashes or color changes  Pertinent labs:  Lab Results  Component Value Date   TSH 2.05 04/25/2021   Lab Results  Component Value Date   WBC 3.6 (L) 07/14/2024   HGB 14.5 07/14/2024   HCT 43.6 07/14/2024   MCV 91.6 07/14/2024   PLT 389 07/14/2024   Lab Results  Component Value Date   CREATININE 1.01 07/14/2024   BUN 11 07/14/2024   NA 137 07/14/2024   K 4.9 07/14/2024   CL 102 07/14/2024   CO2 27 07/14/2024   Lab Results  Component Value Date   ALT 10 07/14/2024   AST 12 07/14/2024   ALKPHOS 67 04/26/2024   BILITOT 0.4 07/14/2024   Lab Results  Component Value Date   CHOL 158 04/26/2024   Lab Results  Component Value Date   HDL 55.50  04/26/2024   Lab Results  Component Value Date   LDLCALC 75 04/26/2024   Lab Results  Component Value Date   TRIG 140.0 04/26/2024   Lab Results  Component Value Date   CHOLHDL 3 04/26/2024   Lab Results  Component Value Date   HGBA1C 5.6 11/25/2022   HGBA1C 5.6 11/25/2022   HGBA1C 5.6 (A) 11/25/2022   HGBA1C 5.6 11/25/2022   ASSESSMENT AND PLAN:   #1 health maintenance exam: Reviewed age and gender appropriate health maintenance issues (prudent diet, regular exercise, health risks of tobacco and excessive alcohol, use of seatbelts, fire alarms in home, use of sunscreen).  Also reviewed age and gender appropriate health screening as well as vaccine recommendations. Vaccines:  Tdap->Utd TODAY.  Otherwise All up-to-date Labs: CBC, lipid panel, c-Met Cervical ca screening: Hx of oopherectomy and total hyst-->[facility]. Breast ca screening: [facility]-->due at any time now. Colon ca screening:  last colonoscopy 10/2023 [facility]), rpt 5 yrs.  #2 hypercholesterolemia. Doing well on pravastatin  10 mg a day. Monitor  lipid panel and hepatic panel today.   3.  Recurrent major depressive disorder, in remission.  GAD, doing well on BuSpar  10 mg twice a day and alprazolam  0.5 mg 3 times a day. Update controlled substance contract and UDS today.  4.  Recurrent tension headaches.  Use of Fioricet 1-2 tabs once a day as needed has been very helpful. Continue.  5.  Chronic pain syndrome--> myofascial pain and arthritis of multiple etiologies: Followed by rheumatology and felt to have a component of inflammatory arthritis as well as CPPD and osteoarthritis. Doing much better since she has had significant weight loss with semaglutide --> prescribed by her rheumatologist. She has been off of methotrexate and leucovorin.  An After Visit Summary was printed and given to the patient.  FOLLOW UP:  Return in about 6 months (around 05/10/2025) for routine chronic illness f/u.  Signed:  Gerlene Hockey, MD           11/09/2024     [1]  Allergies Allergen Reactions   Penicillins Anaphylaxis and Rash    Other Reaction(s): Unknown   Sulfa Antibiotics Anaphylaxis and Rash

## 2024-11-09 NOTE — Patient Instructions (Signed)

## 2024-11-10 LAB — DM TEMPLATE

## 2024-11-10 LAB — DRUG MONITOR, PANEL 1, SCREEN, URINE
Amphetamines: NEGATIVE ng/mL
Barbiturates: POSITIVE ng/mL — AB
Benzodiazepines: NEGATIVE ng/mL
Cocaine Metabolite: NEGATIVE ng/mL
Creatinine: 46.7 mg/dL
Marijuana Metabolite: POSITIVE ng/mL — AB
Methadone Metabolite: NEGATIVE ng/mL
Opiates: NEGATIVE ng/mL
Oxidant: NEGATIVE ug/mL
Oxycodone: NEGATIVE ng/mL
Phencyclidine: NEGATIVE ng/mL
pH: 5.8 (ref 4.5–9.0)

## 2024-11-15 ENCOUNTER — Other Ambulatory Visit: Payer: Self-pay | Admitting: Family Medicine

## 2024-12-06 ENCOUNTER — Other Ambulatory Visit: Payer: Self-pay | Admitting: Family Medicine

## 2024-12-06 MED ORDER — TRAMADOL HCL 50 MG PO TABS: 50.0000 mg | ORAL_TABLET | Freq: Four times a day (QID) | ORAL | 1 refills | Status: AC | PRN

## 2024-12-06 NOTE — Telephone Encounter (Signed)
 No further action needed at this time, message reviewed by pt

## 2024-12-06 NOTE — Telephone Encounter (Signed)
 Prescription sent

## 2024-12-06 NOTE — Telephone Encounter (Signed)
 Requesting: tramadol   Contract: 11/09/24 UDS: 11/09/24 Last Visit: 11/09/24 Next Visit: 05/10/25 Last Refill: 10/25/23 (90,1)  Please Advise. Rx pending

## 2024-12-07 ENCOUNTER — Other Ambulatory Visit: Payer: Self-pay | Admitting: Family Medicine

## 2024-12-08 ENCOUNTER — Other Ambulatory Visit (HOSPITAL_COMMUNITY): Payer: Self-pay

## 2024-12-08 ENCOUNTER — Telehealth: Payer: Self-pay

## 2024-12-08 NOTE — Telephone Encounter (Signed)
 Requesting: zolpidem  Contract: 11/09/24 UDS: 11/09/24 Last Visit: 11/09/24 Next Visit: 05/10/25 Last Refill: 06/12/24 (90,1)  Please Advise. Rx pending

## 2024-12-08 NOTE — Telephone Encounter (Signed)
 Pharmacy Patient Advocate Encounter   Received notification from [facility] KEY that prior authorization for Tramadol  Hcl 50 tabs is required/requested.   Insurance verification completed.   The patient is insured through [facility].   Per test claim: PA required; PA submitted to above mentioned insurance via Latent Key/confirmation #/EOC [id number] Status is pending

## 2024-12-08 NOTE — Telephone Encounter (Signed)
 Pharmacy Patient Advocate Encounter  Received notification from [facility] that Prior Authorization for Tramadol  50 has been APPROVED from 12/08/24 to 06/06/25   PA #/Case ID/Reference #: # [id number]

## 2024-12-11 ENCOUNTER — Other Ambulatory Visit (HOSPITAL_COMMUNITY): Payer: Self-pay

## 2025-05-10 ENCOUNTER — Ambulatory Visit: Admitting: Family Medicine
